# Patient Record
Sex: Female | Born: 1975 | Hispanic: No | Marital: Married | Smoking: Never smoker
Health system: Southern US, Community
[De-identification: ages and names within clinical notes are randomized; demographics above are authoritative.]

## PROBLEM LIST (undated history)

## (undated) DIAGNOSIS — L509 Urticaria, unspecified: Secondary | ICD-10-CM

## (undated) DIAGNOSIS — Z923 Personal history of irradiation: Secondary | ICD-10-CM

## (undated) DIAGNOSIS — K759 Inflammatory liver disease, unspecified: Secondary | ICD-10-CM

## (undated) DIAGNOSIS — B181 Chronic viral hepatitis B without delta-agent: Secondary | ICD-10-CM

## (undated) DIAGNOSIS — Z9221 Personal history of antineoplastic chemotherapy: Secondary | ICD-10-CM

## (undated) DIAGNOSIS — J189 Pneumonia, unspecified organism: Secondary | ICD-10-CM

## (undated) HISTORY — DX: Urticaria, unspecified: L50.9

## (undated) HISTORY — DX: Chronic viral hepatitis B without delta-agent: B18.1

---

## 2000-11-14 ENCOUNTER — Inpatient Hospital Stay (HOSPITAL_COMMUNITY): Admission: AD | Admit: 2000-11-14 | Discharge: 2000-11-14 | Payer: Self-pay | Admitting: Obstetrics & Gynecology

## 2000-11-14 ENCOUNTER — Encounter: Payer: Self-pay | Admitting: *Deleted

## 2000-11-20 ENCOUNTER — Other Ambulatory Visit: Admission: RE | Admit: 2000-11-20 | Discharge: 2000-11-20 | Payer: Self-pay | Admitting: Obstetrics and Gynecology

## 2001-04-23 ENCOUNTER — Inpatient Hospital Stay (HOSPITAL_COMMUNITY): Admission: AD | Admit: 2001-04-23 | Discharge: 2001-04-23 | Payer: Self-pay | Admitting: Obstetrics and Gynecology

## 2001-04-27 ENCOUNTER — Encounter (INDEPENDENT_AMBULATORY_CARE_PROVIDER_SITE_OTHER): Payer: Self-pay

## 2001-04-27 ENCOUNTER — Inpatient Hospital Stay (HOSPITAL_COMMUNITY): Admission: AD | Admit: 2001-04-27 | Discharge: 2001-04-30 | Payer: Self-pay | Admitting: Obstetrics and Gynecology

## 2004-10-10 ENCOUNTER — Emergency Department (HOSPITAL_COMMUNITY): Admission: EM | Admit: 2004-10-10 | Discharge: 2004-10-10 | Payer: Self-pay | Admitting: Emergency Medicine

## 2006-01-10 ENCOUNTER — Other Ambulatory Visit: Admission: RE | Admit: 2006-01-10 | Discharge: 2006-01-10 | Payer: Self-pay | Admitting: *Deleted

## 2009-03-14 ENCOUNTER — Other Ambulatory Visit: Admission: RE | Admit: 2009-03-14 | Discharge: 2009-03-14 | Payer: Self-pay | Admitting: Family Medicine

## 2010-06-22 ENCOUNTER — Other Ambulatory Visit (HOSPITAL_COMMUNITY)
Admission: RE | Admit: 2010-06-22 | Discharge: 2010-06-22 | Disposition: A | Discharge status: Home / Self Care | Payer: 59 | Source: Ambulatory Visit | Attending: Internal Medicine | Admitting: Internal Medicine

## 2010-06-22 ENCOUNTER — Other Ambulatory Visit: Payer: Self-pay

## 2010-06-22 DIAGNOSIS — Z01419 Encounter for gynecological examination (general) (routine) without abnormal findings: Secondary | ICD-10-CM | POA: Insufficient documentation

## 2010-09-07 NOTE — Discharge Summary (Signed)
[facility]  Patient:    Sara Browning, Sara Browning Visit Number: [id number] MRN: [id number]          Service Type: OBS Location: 910A 9147 01 Attending Physician:  Leonard Schwartz Dictated by:   Nigel Bridgeman, C.N.M. Admit Date:  04/27/2001 Discharge Date: 04/30/2001                             Discharge Summary  ADMISSION DIAGNOSES: 1. Intrauterine pregnancy at 38-5/7 weeks. 2. Positive group B strep. 3. Spontaneous rupture of membranes with early labor. 4. Hepatitis B carrier. 5. History of proteinuria.  DISCHARGE DIAGNOSES: 1. Term pregnancy. 2. Nonreassuring fetal heart rate tracing. 3. Short cord. 4. Occiput posterior position.  PROCEDURE: 1. Primary low transverse cesarean section. 2. Epidural anesthesia.  HISTORY OF PRESENT ILLNESS:  Sara Browning is a 35 year old gravida 4, para 0-0-3-0, at 38-5/7 weeks who presented with spontaneous rupture of membranes early in the morning of 04/27/01.  She was having mild uterine contractions. She had a 24 hour urine completed on 04/25/01, secondary to pronteinuria, but no other signs of preeclampsia.  She had 698 mg of protein in a 24 hour specimen, normal creatinine clearance.  PREGNANCY REMARKABLE FOR: 1. Hepatitis B carrier. 2. Positive group B strep. 3. Proteinuria without signs or symptoms of preeclampsia. 4. One TAB, two SAB. 5. Equivocal rubella. 6. Slightly late care. 7. Sister with congenital heart disease.  HOSPITAL COURSE:  On admission, the patient was 2 cm, 80%, vertex, -2, with a reactive fetal heart rate tracing, and a negative spontaneous CST.  The patient progressed along in her labor.  She received an epidural for pain management.  She began to be augmented with Pitocin when she moved to approximately 5 cm.  At that point, there was a plateau of her labor status noted, and her [place] units were noted to be approximately 150.  Pitocin augmentation per low dose was begun.   She had progressed to 6 cm, and then began to have a vomiting episode with a subsequent hyperstimulation and bradycardia response.  There was resolution of this with scalp stimulation and position change, but then began to have moderate to severe variables with each uterine contraction.  Usual measures did not cause an elimination of these findings, including ammio-infusion, no cord prolapse was noted.  Dr. Stefano Gaul was consulted, and a plan was made for cesarean section secondary to nonreassuring fetal heart rate tracing.  The fetal heart rate was noted to have good variability throughout these findings.  The patient was taken to the operating room where a primary low transverse cesarean section was performed by Dr. Stefano Gaul under epidural anesthesia.  Findings were a viable female by the name of [name], weight 7 pounds 7 ounces, Apgars were 8 and 9.  The fetus was in an OP presentation.  There was a significantly short cord noted. Placenta was sent to pathology.  Normal uterus, tubes, and ovaries were noted. The patient was taken to the recovery room in good condition.  Estimated blood loss was 700 cc.  Infant was taken to the full-term nursery in good condition. By postoperative day #1, the patient was doing well, she was bottle feeding, her hemoglobin was 10.0, white blood cell count was 14.5.  She was electing the Ortho Ever patch for contraception.  Her incision was clean, dry, and intact.  The rest of her hospital stay was uncomplicated.  By day #3, she  was deemed to receive full benefit of her hospital stay.  She had a normal physical examination, and she was discharged home.  DISCHARGE INSTRUCTIONS:  Per [facility] handout.  DISCHARGE MEDICATIONS: 1. Motrin 600 mg p.o. q.6h. p.r.n. pain. 2. Tylox one or two p.o. q.3-4h. p.r.n. pain.  FOLLOWUP:  In six weeks at [facility] Dictated by:   Nigel Bridgeman, C.N.M. Attending Physician:  Leonard Schwartz DD:  04/30/01 TD:  04/30/01 Job: [id number]

## 2010-09-07 NOTE — H&P (Signed)
[facility]  Patient:    Sara Browning, Sara Browning Visit Number: [id number] MRN: [id number]          Service Type: OBS Location: 910B 9155 01 Attending Physician:  Leonard Schwartz Dictated by:   Nigel Bridgeman, C.N.M. Admit Date:  04/27/2001                           History and Physical  DATE OF BIRTH:                21-Jun-1975  HISTORY:                      Sara Browning is a 35 year old gravida 4, para 0-0-3-0 at 53 5/7 weeks who presents today with spontaneous rupture of membranes at 6:45 this morning.  Clear fluid noted.  She is reporting contractions approximately every four minutes of mild to moderate quality. Pregnancy has been remarkable for one TAB, one questionable ectopic versus SAB, and one verified SAB, proteinuria in the third trimester with normal blood pressures and normal chemistries, hepatitis B carrier, equivocal rubella, slightly late to care, sister with congenital heart disease, positive beta Strep.  PRENATAL LABORATORIES:        Blood type B+.  Rh antibody negative.  VDRL nonreactive.  Rubella titer equivocal.  Hepatitis B surface antigen positive. Liver function tests were normal in August.  GC and chlamydia cultures were negative.  Pap was normal.  Glucose challenge was normal.  AFP was normal. Hemoglobin upon entry into practice was 10.9, 10.8 at 30 weeks.  Sickle cell test is negative.  EDC of May 06, 2001 was established by ultrasound in July at approximately 14 weeks secondary to questionable LMP dating.  Group B Strep culture was positive at 36 weeks.  HISTORY OF PRESENT PREGNANCY: Patient entered care at approximately 16 weeks. She had been seen in maternity admissions just prior to that and had an ultrasound.  She is a hepatitis B carrier.  Her LFTs have been monitored q.6 months while she was in the [employer].  She had some spotting in the first trimester.  The rest of her pregnancy was essentially  uncomplicated until she showed 2+ protein on a voided specimen in the first trimester.  LFTs and chemistries were normal.  A 24 hour urine protein was done with 24 hour urine protein approximately 637.  PAST OBSTETRICAL HISTORY:     In 1998 she had a therapeutic termination of pregnancy at 12 weeks.  In 2000 she had a questionable ectopic versus SAB at 4-5 weeks.  In 2001 she had a spontaneous miscarriage in the early stages. She did have some anemia.  PAST MEDICAL HISTORY:         She had OCP use three months of the year prior to pregnancy.  She thinks she may have had a yeast infection in the past.  She reports usual childhood illnesses.  She was diagnosed as a hepatitis B carrier while she was in the [employer] and has had approximately every six to nine month LFTs since then.  She had one history of UTI.  She had her wisdom teeth removed in the past.  She was separated from the miliary in October 2001.  Has had no LFTs since then.  FAMILY HISTORY:               Patients sister has some kind of heart disease since birth and  sees a cardiologist regularly.  Paternal grandmother has hypertension, is on medications.  Patients mother and sister are both hepatitis B carriers.  Paternal grandfather has diet controlled diabetes. Maternal grandmother has insulin-dependent diabetes.  Her uncle is a smoker.  GENETIC HISTORY:              Remarkable for the father of the babys mother having web toes, patients half-sister having a congenital heart disease, father of the babys cousin married to another cousin, father of the babys first cousin having sickle cell disease.  Father of the baby was tested but it has been negative.  ALLERGIES:                    No known drug allergies.  SOCIAL HISTORY:               Patient is single.  The father of the baby is involved and support.  His name is Arnetha Courser.  She is a Education officer, environmental and is nondenominational.  She has one year of college.  She  is currently a Consulting civil engineer.  She was separated from the [employer] in October 2001.  Father of the baby has a high school education.  He is in the [employer].  She denies any alcohol, drug, or tobacco use during this pregnancy.  PHYSICAL EXAMINATION  VITAL SIGNS:                  Stable.  Patient is afebrile.  HEENT:                        Within normal limits.  LUNGS:                        Bilateral breath sounds are clear.  HEART:                        Regular rate and rhythm without murmur.  BREASTS:                      Soft and nontender.  ABDOMEN:                      Fundal height is approximately 38 cm.  Estimated fetal weight 7-7.5 pounds.  Uterine contractions are every two to four minutes, mild to moderate quality.  PELVIC:                       Cervical examination:  2 cm, 80%, vertex at -2 station with leaking clear fluid noted.  Fetal heart rate is reactive with negative spontaneous CSP.  EXTREMITIES:                  Deep tendon reflexes are 2+ without clonus. There is a trace edema noted.  IMPRESSION:                   1. Intrauterine pregnancy at 38 5/7 weeks.                               2. Spontaneous rupture of membranes with early                                  labor.  3. Positive group B Strep.  PLAN:                         1. Admit to birthing suite for consult with Dr.                                  Marline Backbone as attending physician.                               2. Routine certified nurse midwife orders.                               3. Plan group B Strep prophylaxis, penicillin-G                                  per standard dosing.                               4. Plan epidural as labor advances. Dictated by:   Nigel Bridgeman, C.N.M. Attending Physician:  Leonard Schwartz DD:  04/27/01 TD:  04/27/01 Job: [id number]

## 2010-09-07 NOTE — Op Note (Signed)
[facility]  Patient:    Sara Browning, Sara Browning Visit Number: [id number] MRN: [id number]          Service Type: OBS Location: 910B 9155 01 Attending Physician:  Leonard Schwartz Dictated by:   Janine Limbo, M.D. Proc. Date: 04/27/01 Admit Date:  04/27/2001                             Operative Report  PREOPERATIVE DIAGNOSES:       1. Term intrauterine pregnancy.                               2. Nonreassuring fetal heart                                  rate tracing.  POSTOPERATIVE DIAGNOSES:      1. Term intrauterine pregnancy.                               2. Nonreassuring fetal heart rate                                  tracing.                               3. Short umbilical cord.  OPERATION:                    Primary low transverse cesarean section.  SURGEON:                      Janine Limbo, M.D.  ASSISTANT:                    Nigel Bridgeman, C.N.M.  ANESTHESIA:                   Epidural.  DISPOSITION:                  The patient is a 35 year old female, gravida 4, para 0-0-3-0, who presents at [redacted] weeks gestation with rupture of membranes. She dilated her cervix to 6 cm but progressed no further.  The patient coughed on one occasion and then she began having deep variable and late decelerations that did not resolve with positioning.  The patient was advised of our recommendation that we proceed with cesarean delivery and the risks of the procedure were reviewed.  FINDINGS:  A 7 pound 7 ounce female infant [name]) was delivered.  The infant was in occiput posterior presentation.  The Apgars were 8 at one minute and 9 at five minutes.  The uterus, fallopian tubes and ovaries were normal. The placenta was normal except that the umbilical cord was very short.  The placenta was sent to pathology for evaluation.  DESCRIPTION OF PROCEDURE:  The patient was taken to the operating room where it was determined that the  epidural the patient had received for labor would b adequate for cesarean section.  A Foley catheter had previously been placed. The patients abdomen was prepped with multiple layers of Betadine and then sterilely draped.  A low transverse incision was  made in the abdomen and carried sharply through the subcutaneous tissue, the fascia and the anterior peritoneum.  An incision was made in the lower uterine segment and extended transversely.  The fetal head was delivered from the occiput posterior presentation.  The mouth and nose were suctioned.  The remainder of the infant was delivered and the infant was handed to the awaiting pediatrician.  The umbilical cord was noted to be very short.  Routine cord blood studies were obtained.  The placenta was removed.  The uterine cavity was cleaned of amniotic fluid, clotted blood and membranes.  The uterine incision was closed using a running locking suture of 2-0 Vicryl.  Hemostasis was noted to be adequate.  The pericolonic gutters were cleaned of amniotic fluid and clotted blood.  Again hemostasis was adequate.  The pelvis was vigorously irrigated. The anterior peritoneum and abdominal musculature were reapproximated in the midline using interrupted sutures of 2-0 Vicryl.  The fascia was closed using a running suture of 0 Vicryl followed by three interrupted sutures of 0 Vicryl.  The subcutaneous layer was irrigated.  The subcutaneous layer was closed using a running suture of 2-0 Vicryl and 3-0 Vicryl.  The skin was reapproximated using skin staples.  Sponge, needle and instrument counts were correct on two occasions.  The estimated blood loss was 700 cc.  The patient tolerated the procedure well.  The patient was taken to the recovery room in stable condition.  The infant was taken to the full-term nursery in stable condition.  Placenta was sent to pathology for evaluation. INDICATIONS:  DESCRIPTION OF PROCEDURE: Dictated by:   Janine Limbo, M.D. Attending Physician:  Leonard Schwartz DD:  04/27/01 TD:  04/27/01 Job: [id number]

## 2011-01-07 ENCOUNTER — Other Ambulatory Visit: Payer: Self-pay

## 2011-01-07 ENCOUNTER — Other Ambulatory Visit (HOSPITAL_COMMUNITY)
Admission: RE | Admit: 2011-01-07 | Discharge: 2011-01-07 | Disposition: A | Discharge status: Home / Self Care | Payer: 59 | Source: Ambulatory Visit | Attending: Family Medicine | Admitting: Family Medicine

## 2011-01-07 DIAGNOSIS — Z01419 Encounter for gynecological examination (general) (routine) without abnormal findings: Secondary | ICD-10-CM | POA: Insufficient documentation

## 2012-01-08 ENCOUNTER — Other Ambulatory Visit: Payer: Self-pay | Admitting: Family Medicine

## 2012-01-08 DIAGNOSIS — R430 Anosmia: Secondary | ICD-10-CM

## 2012-01-13 ENCOUNTER — Ambulatory Visit
Admission: RE | Admit: 2012-01-13 | Discharge: 2012-01-13 | Disposition: A | Discharge status: Home / Self Care | Payer: 59 | Source: Ambulatory Visit | Attending: Family Medicine | Admitting: Family Medicine

## 2012-01-13 ENCOUNTER — Other Ambulatory Visit (HOSPITAL_COMMUNITY)
Admission: RE | Admit: 2012-01-13 | Discharge: 2012-01-13 | Disposition: A | Discharge status: Home / Self Care | Payer: 59 | Source: Ambulatory Visit | Attending: Family Medicine | Admitting: Family Medicine

## 2012-01-13 ENCOUNTER — Other Ambulatory Visit: Payer: Self-pay | Admitting: Family Medicine

## 2012-01-13 DIAGNOSIS — R430 Anosmia: Secondary | ICD-10-CM

## 2012-01-13 DIAGNOSIS — Z01419 Encounter for gynecological examination (general) (routine) without abnormal findings: Secondary | ICD-10-CM | POA: Insufficient documentation

## 2013-10-05 IMAGING — CT CT MAXILLOFACIAL W/O CM
2 of 3 series · 16 of 37 positions shown, 20 images · non-contrast
Comparison: None.

CLINICAL DATA: Anosmia, postnasal drainage.

CT MAXILLOFACIAL WITHOUT CONTRAST
TECHNIQUE: Multidetector CT imaging of the maxillofacial
structures was performed. Multiplanar CT image reconstructions were
also generated.

[Series 601: coronal facial · coronal · 0.33mm/px · 3 of 74 slices shown]
[im 25/74  bone]
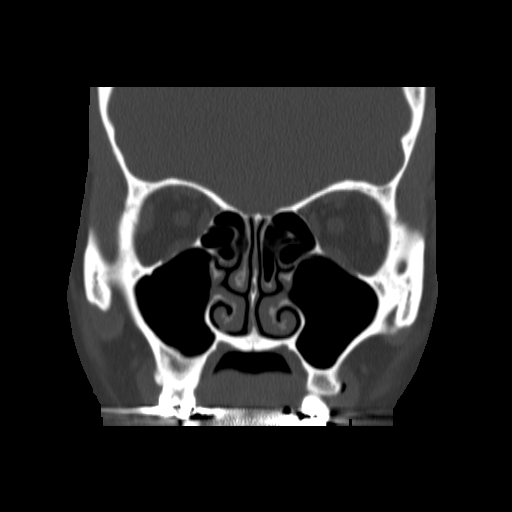
[im 37/74  bone]
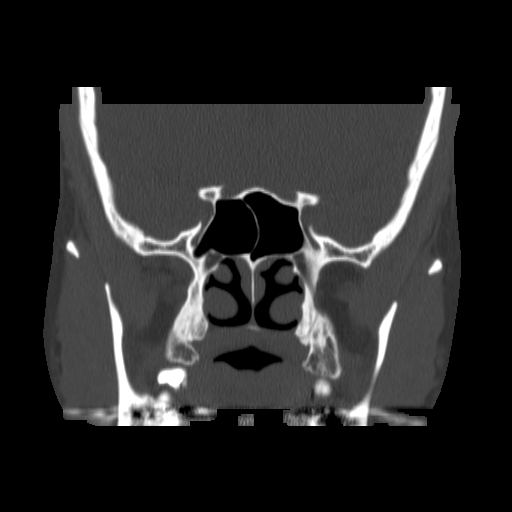
[im 49/74  bone]
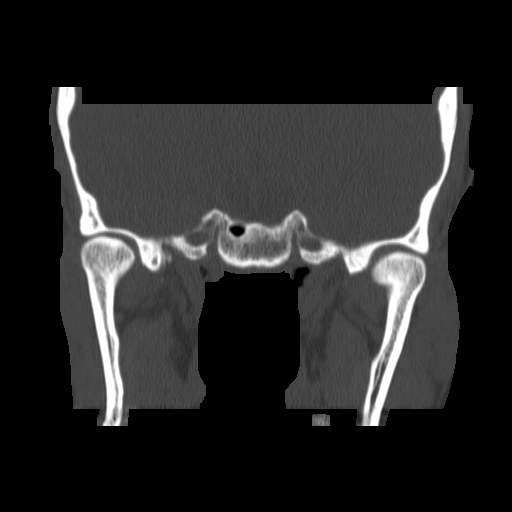

[Series 602: sagittal facial · sagittal · 0.33mm/px · 13 of 83 slices shown, 17 images]
[im 5/83  brain]
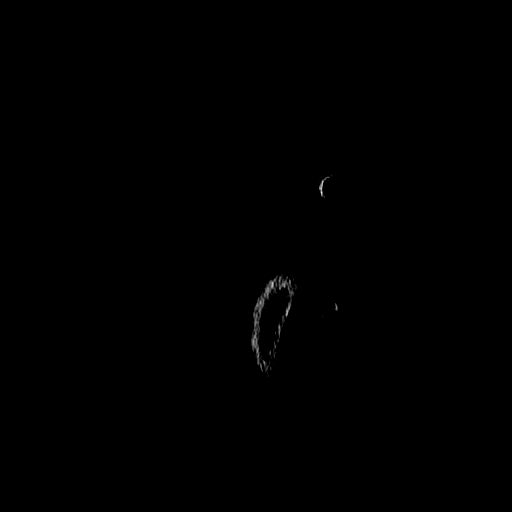
[im 5/83  bone]
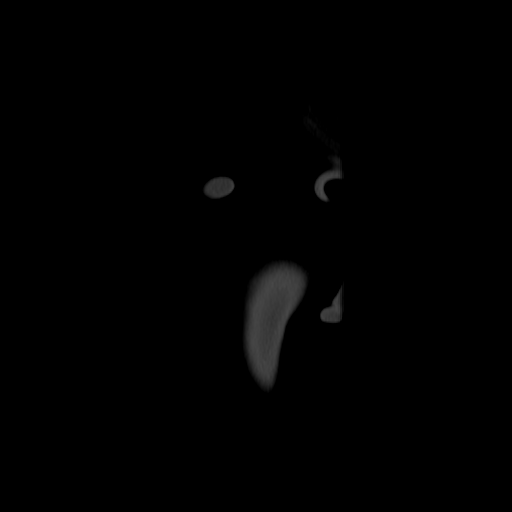
[im 10/83  bone]
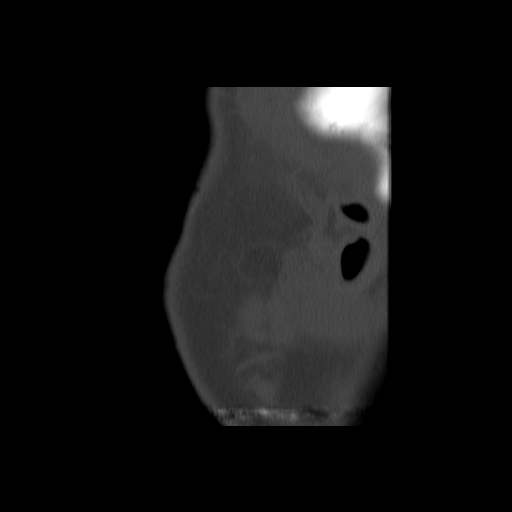
[im 19/83  bone]
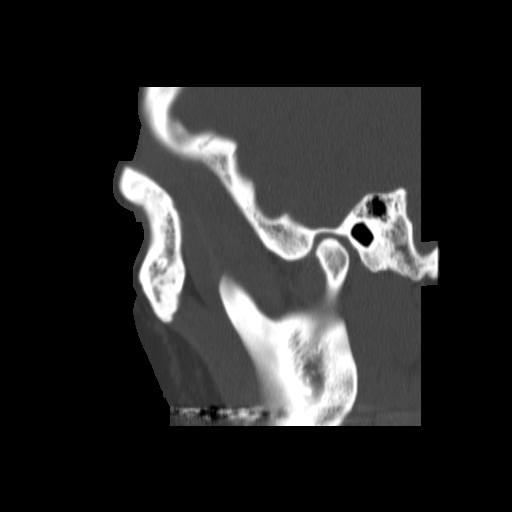
[im 23/83  bone]
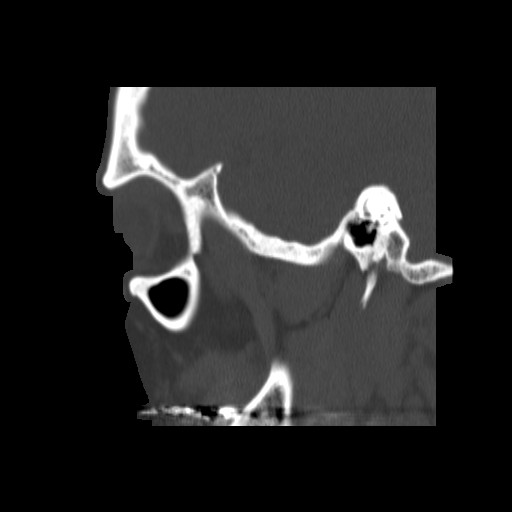
[im 28/83  brain]
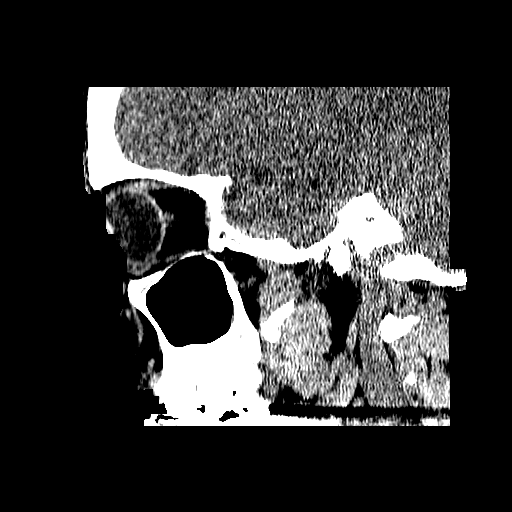
[im 28/83  bone]
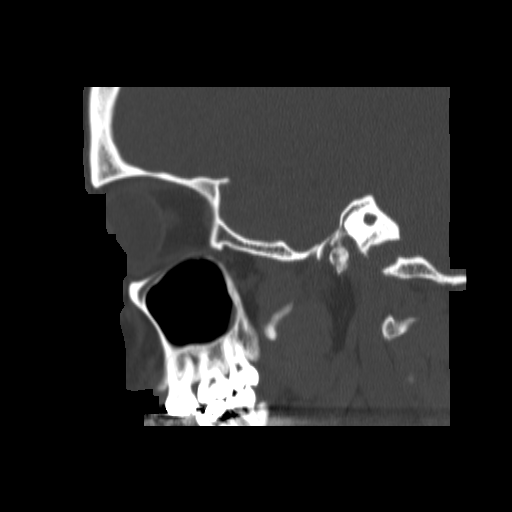
[im 37/83  bone]
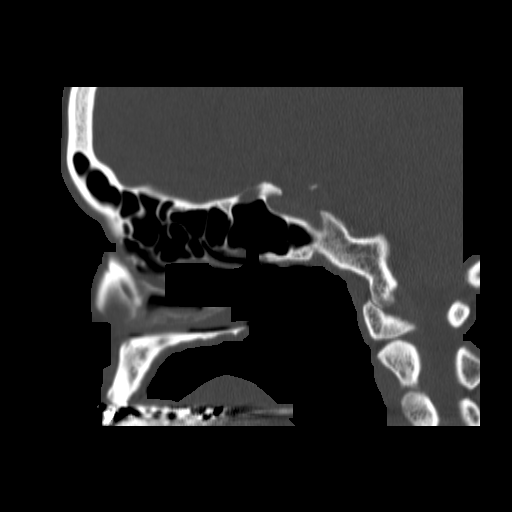
[im 42/83  bone]
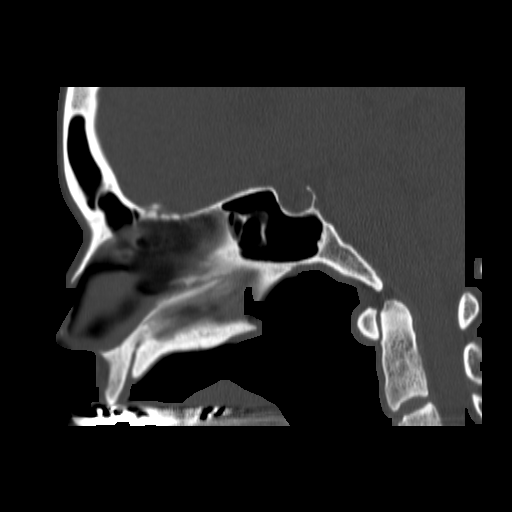
[im 46/83  bone]
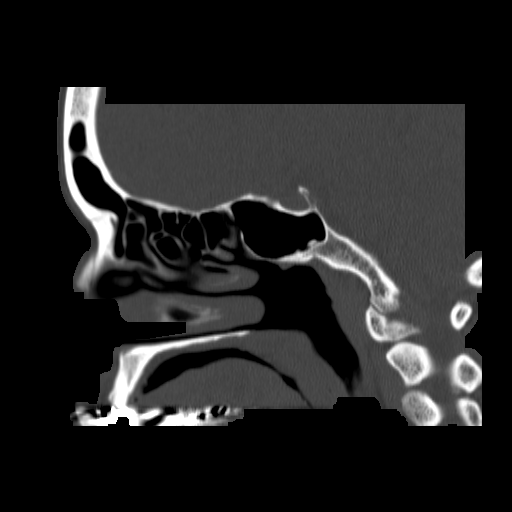
[im 55/83  brain]
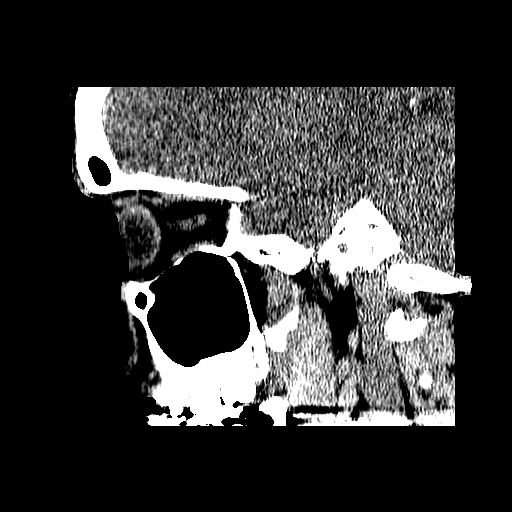
[im 55/83  bone]
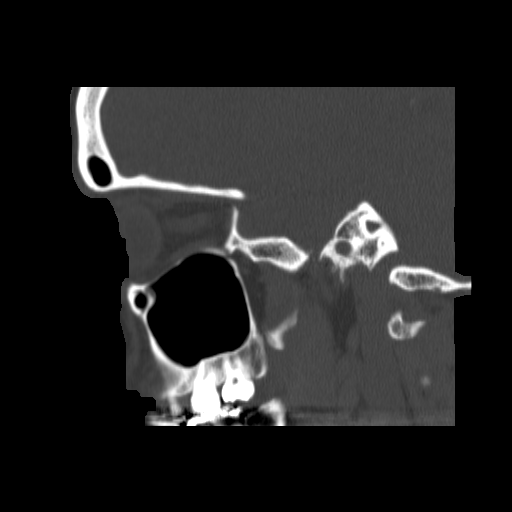
[im 60/83  bone]
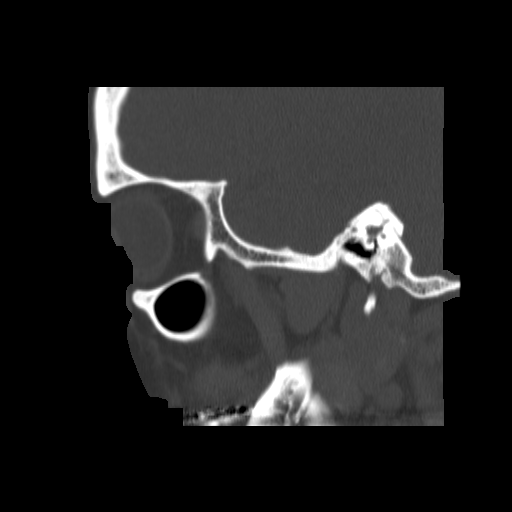
[im 64/83  bone]
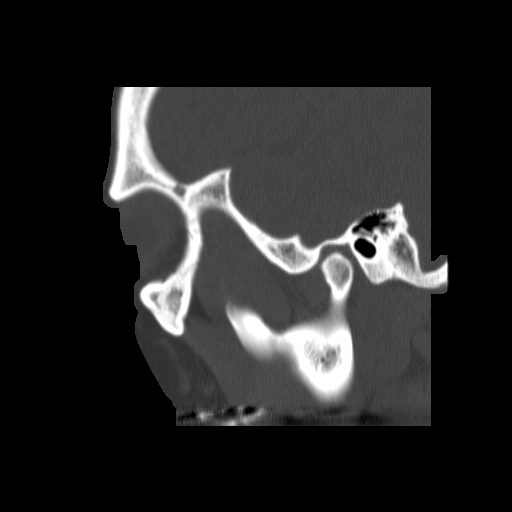
[im 73/83  bone]
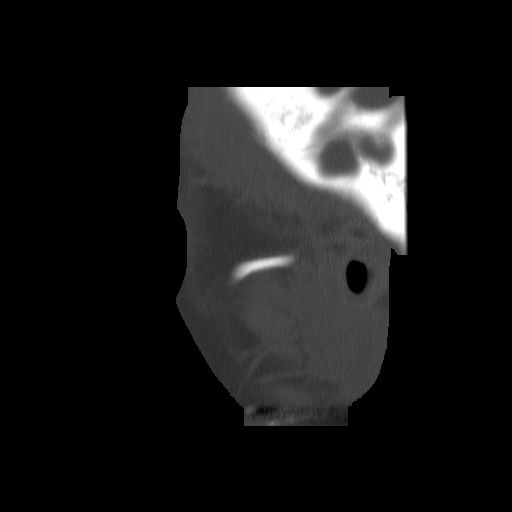
[im 78/83  brain]
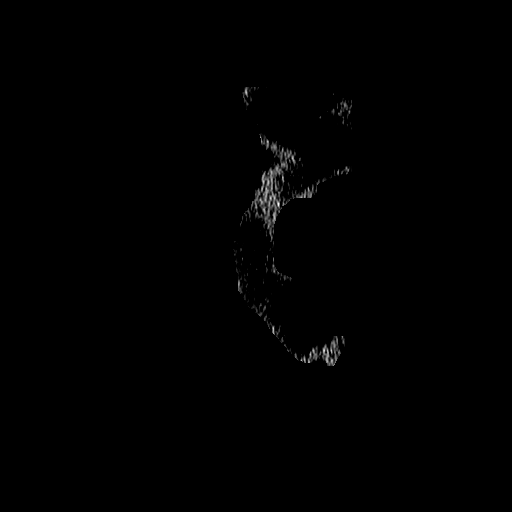
[im 78/83  bone]
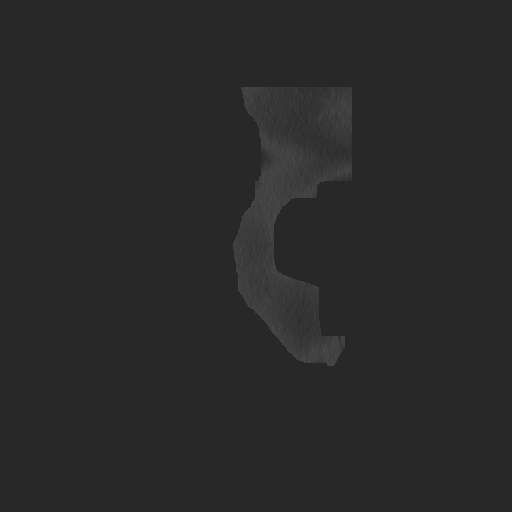

[16 of 37 positions shown; findings below may reference images not displayed]

FINDINGS: Trace mucosal thickening in the right maxillary sinus.
The paranasal sinuses are otherwise clear.  Visualized portions of
the intracranial contents show no acute findings.
IMPRESSION: Trace mucosal thickening in the right maxillary sinus.  Paranasal
sinuses are otherwise clear.

## 2014-02-28 ENCOUNTER — Other Ambulatory Visit (HOSPITAL_COMMUNITY)
Admission: RE | Admit: 2014-02-28 | Discharge: 2014-02-28 | Disposition: A | Discharge status: Home / Self Care | Payer: BC Managed Care – PPO | Source: Ambulatory Visit | Attending: Family Medicine | Admitting: Family Medicine

## 2014-02-28 ENCOUNTER — Other Ambulatory Visit: Payer: Self-pay

## 2014-02-28 DIAGNOSIS — Z0000 Encounter for general adult medical examination without abnormal findings: Secondary | ICD-10-CM | POA: Insufficient documentation

## 2014-03-02 LAB — CYTOLOGY - PAP

## 2020-03-28 ENCOUNTER — Encounter: Payer: Self-pay | Admitting: Allergy

## 2020-03-28 ENCOUNTER — Ambulatory Visit (INDEPENDENT_AMBULATORY_CARE_PROVIDER_SITE_OTHER): Payer: BC Managed Care – PPO | Admitting: Allergy

## 2020-03-28 ENCOUNTER — Other Ambulatory Visit: Payer: Self-pay

## 2020-03-28 VITALS — BP 102/68 | HR 70 | Resp 16 | Ht 63.5 in | Wt 160.4 lb

## 2020-03-28 DIAGNOSIS — R059 Cough, unspecified: Secondary | ICD-10-CM

## 2020-03-28 DIAGNOSIS — L508 Other urticaria: Secondary | ICD-10-CM

## 2020-03-28 DIAGNOSIS — T783XXD Angioneurotic edema, subsequent encounter: Secondary | ICD-10-CM

## 2020-03-28 MED ORDER — ALBUTEROL SULFATE HFA 108 (90 BASE) MCG/ACT IN AERS: INHALATION_SPRAY | RESPIRATORY_TRACT | 1 refills | Status: DC

## 2020-03-28 NOTE — Patient Instructions (Addendum)
Hives and swelling, chronic  - at this time etiology of hives and swelling is unknown.  Hives can be caused by a variety of different triggers including illness/infection, foods, medications, stings, exercise, pressure, vibrations, extremes of temperature to name a few however majority of the time there is no identifiable trigger.  Your symptoms have been ongoing for >6 weeks making this chronic thus will obtain labwork to evaluate: CBC w diff, CMP, tryptase, hive panel, environmental panel, alpha-gal panel, milk IgE  - for hive/swelling management recommend the following regimen: Cetirizine 10 mg 1 tablet twice a day with Pepcid 20 mg 1 tablet twice a day.  These both have antihistamine properties.  -If high-dose antihistamine regimen is not effective enough then will recommend adding Singulair.  If this is also not enough in the next step would be to initiate Xolair monthly injections.  We will discuss this in further detail if needing to get to the step.  Cough  - Most common reasons for cough include allergies with sinus drainage, asthma/lung issues and reflux  - You know a component of heartburn this reflux could be a big trigger of cough.  As above we will have you start Pepcid twice a day.  Please note if this improves with heartburn symptoms and cough  -You also notes wheeze which typically is a primary lung manifestation.  Try albuterol inhaler 2 puffs every 4 hours as needed for cough, wheeze, shortness of breath or chest tightness.  Note if you have any improvement with use of albuterol and let us know  Follow-up in 2-3 months or sooner if needed

## 2020-03-28 NOTE — Progress Notes (Signed)
New Patient Note  RE: Sara Browning MRN: [id number] DOB: 1975-09-19 Date of Office Visit: 03/28/2020  Referring provider: Jerrye Bushy, FNP Primary care provider: [name], NP    Chief Complaint: hives  History of present illness: Sara Browning is a 44 y.o. female presenting today for consultation for hives.  She reports having hives that started when the weather start getting cooler in the evening (around the end of September).  She states she was noticing hives about 3-4 times a week and as weather got colder became more frequent.  Currently the hives occur about every 2-3 days.   She has noted lip swelling with hives.  She states she can have hives on exposure skin areas like legs and arms.  Also noticed has occurred on scalp and on bra line.  The hives are itchy.  Hives are typically gone by the next day after starting at night.  She states a hot shower does help.  She also has been using coconut oil and feels like it helps as well.  She has also been taking cetirizine 1 tab a day as needed when she states she can't take the itch.  It does help with the itch.  She did try benadryl as it made her groggy the next day thus didn't like it.  No fevers, no joint aches/pains and no marks/bruising.  Denies any preceding illness, sting's, new medications, new foods, change in soaps/detergents/lotions.  Has not identified any other exacerbating factors.  She also states she has an ongoing cough that seems to start around the same time as the hives. States co-workers have noted she coughs at work.  She also reports coughs as home.  She mentioned noticing mucus in her throat as well.  She does not wheezing with the cough.  Denies shortness of breath or chest tightness.  Has been using cough medications.  Does not have an inhaler and no history asthma.  She has noted that when she drinks coffee with creamer she will note cough.  She state she does not drink a lot of milk but does eat a lot  of cheese.  She denies having symptoms of allergic rhinoconjunctivitis or eczema.   She states she is a carrier for HepB and states she had normal testing at her recent physical.   Review of systems: Review of Systems  Constitutional: Negative.   HENT: Negative.   Eyes: Negative.   Respiratory: Positive for cough and wheezing. Negative for sputum production and shortness of breath.   Cardiovascular: Negative.   Gastrointestinal: Negative.   Musculoskeletal: Negative.   Skin: Positive for itching and rash.  Neurological: Negative.     All other systems negative unless noted above in HPI  Past medical history: Past Medical History:  Diagnosis Date  . Hepatitis 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)   . Urticaria     Past surgical history: Past Surgical History:  Procedure Laterality Date  . CESAREAN SECTION     twice    Family history:  Family History  Problem Relation Age of Onset  . Hypertension Paternal Grandmother   . Diabetes Paternal Grandfather     Social history: Lives in a home with carpeting in the bedroom with gas and electric heating and central cooling.  Dog conditioning.  There is no concern for water damage, mildew or roaches in the home.  She is a Chief Technology Officer.  She denies a smoking history.  Medication List: Current Outpatient Medications  Medication  Sig Dispense Refill  . Multiple Vitamin (MULTIVITAMIN) tablet Take 1 tablet by mouth daily.     No current facility-administered medications for this visit.    Known medication allergies: No Known Allergies   Physical examination: Blood pressure 102/68, pulse 70, resp. rate 16, height 5' 3.5" (1.613 m), weight 160 lb 6.4 oz (72.8 kg), SpO2 99 %.  General: Alert, interactive, in no acute distress. HEENT: PERRLA, TMs pearly gray, turbinates non-edematous without discharge, post-pharynx non erythematous. Neck: Supple without lymphadenopathy. Lungs: Clear to auscultation without wheezing, rhonchi or rales.  {no increased work of breathing. CV: Normal S1, S2 without murmurs. Abdomen: Nondistended, nontender. Skin: Warm and dry, without lesions or rashes. Extremities:  No clubbing, cyanosis or edema. Neuro:   Grossly intact.  Diagnositics/Labs: None today  Assessment and plan:   Urticaria with angioedema, chronic  - at this time etiology of hives and swelling is unknown.  Hives can be caused by a variety of different triggers including illness/infection, foods, medications, stings, exercise, pressure, vibrations, extremes of temperature to name a few however majority of the time there is no identifiable trigger.  Your symptoms have been ongoing for >6 weeks making this chronic thus will obtain labwork to evaluate: CBC w diff, CMP, tryptase, hive panel, environmental panel, alpha-gal panel, milk IgE  - for hive/swelling management recommend the following regimen: Cetirizine 10 mg 1 tablet twice a day with Pepcid 20 mg 1 tablet twice a day.  These both have antihistamine properties.  -If high-dose antihistamine regimen is not effective enough then will recommend adding Singulair.  If this is also not enough in the next step would be to initiate Xolair monthly injections.  We will discuss this in further detail if needing to get to the step.  Cough  - Most common reasons for cough include allergies with sinus drainage, asthma/lung issues and reflux  - You know a component of heartburn this reflux could be a big trigger of cough.  As above we will have you start Pepcid twice a day.  Please note if this improves with heartburn symptoms and cough  -You also notes wheeze which typically is a primary lung manifestation.  Try albuterol inhaler 2 puffs every 4 hours as needed for cough, wheeze, shortness of breath or chest tightness.  Note if you have any improvement with use of albuterol and let us know  Follow-up in 2-3 months or sooner if needed  I appreciate the opportunity to take part in Brandin's  care. Please do not hesitate to contact me with questions.  Sincerely,   Margo Aye, MD Allergy/Immunology [facility]

## 2020-03-29 LAB — ALPHA-GAL PANEL

## 2020-04-04 LAB — ALPHA-GAL PANEL

## 2020-04-08 LAB — CBC WITH DIFFERENTIAL/PLATELET
Basophils Absolute: 0 10*3/uL (ref 0.0–0.2)
Basos: 0 %
EOS (ABSOLUTE): 0.1 10*3/uL (ref 0.0–0.4)
Eos: 2 %
Hematocrit: 43.6 % (ref 34.0–46.6)
Hemoglobin: 14.9 g/dL (ref 11.1–15.9)
Immature Grans (Abs): 0 10*3/uL (ref 0.0–0.1)
Immature Granulocytes: 0 %
Lymphocytes Absolute: 2.5 10*3/uL (ref 0.7–3.1)
Lymphs: 35 %
MCH: 28.7 pg (ref 26.6–33.0)
MCHC: 34.2 g/dL (ref 31.5–35.7)
MCV: 84 fL (ref 79–97)
Monocytes Absolute: 0.4 10*3/uL (ref 0.1–0.9)
Monocytes: 6 %
Neutrophils Absolute: 4.1 10*3/uL (ref 1.4–7.0)
Neutrophils: 57 %
Platelets: 257 10*3/uL (ref 150–450)
RBC: 5.19 x10E6/uL (ref 3.77–5.28)
RDW: 11.9 % (ref 11.7–15.4)
WBC: 7.1 10*3/uL (ref 3.4–10.8)

## 2020-04-08 LAB — ALLERGENS W/TOTAL IGE AREA 2
Alternaria Alternata IgE: <0.1 kU/L
Aspergillus Fumigatus IgE: <0.1 kU/L
Bermuda Grass IgE: <0.1 kU/L
Cat Dander IgE: <0.1 kU/L
Cedar, Mountain IgE: <0.1 kU/L
Cladosporium Herbarum IgE: <0.1 kU/L
Cockroach, German IgE: 0.2 kU/L — AB
Common Silver Birch IgE: <0.1 kU/L
Cottonwood IgE: <0.1 kU/L
D Farinae IgE: 0.74 kU/L — AB
D Pteronyssinus IgE: 0.82 kU/L — AB
Dog Dander IgE: <0.1 kU/L
Elm, American IgE: <0.1 kU/L
IgE (Immunoglobulin E), Serum: 141 IU/mL (ref 6–495)
Johnson Grass IgE: <0.1 kU/L
Maple/Box Elder IgE: <0.1 kU/L
Mouse Urine IgE: <0.1 kU/L
Oak, White IgE: <0.1 kU/L
Pecan, Hickory IgE: <0.1 kU/L
Penicillium Chrysogen IgE: <0.1 kU/L
Pigweed, Rough IgE: <0.1 kU/L
Ragweed, Short IgE: <0.1 kU/L
Sheep Sorrel IgE Qn: <0.1 kU/L
Timothy Grass IgE: <0.1 kU/L
White Mulberry IgE: <0.1 kU/L

## 2020-04-08 LAB — TRYPTASE: Tryptase: 7.2 ug/L (ref 2.2–13.2)

## 2020-04-08 LAB — THYROID ANTIBODIES
Thyroglobulin Antibody: <1 IU/mL (ref 0.0–0.9)
Thyroperoxidase Ab SerPl-aCnc: 20 IU/mL (ref 0–34)

## 2020-04-08 LAB — TSH+FREE T4
Free T4: 1.26 ng/dL (ref 0.82–1.77)
TSH: 2.21 u[IU]/mL (ref 0.450–4.500)

## 2020-04-08 LAB — COMPREHENSIVE METABOLIC PANEL
ALT: 13 IU/L (ref 0–32)
AST: 20 IU/L (ref 0–40)
Albumin/Globulin Ratio: 1.3 (ref 1.2–2.2)
Albumin: 4.3 g/dL (ref 3.8–4.8)
Alkaline Phosphatase: 59 IU/L (ref 44–121)
BUN/Creatinine Ratio: 13 (ref 9–23)
BUN: 11 mg/dL (ref 6–24)
Bilirubin Total: 0.5 mg/dL (ref 0.0–1.2)
CO2: 23 mmol/L (ref 20–29)
Calcium: 9.2 mg/dL (ref 8.7–10.2)
Chloride: 101 mmol/L (ref 96–106)
Creatinine, Ser: 0.82 mg/dL (ref 0.57–1.00)
GFR calc Af Amer: 102 mL/min/{1.73_m2} (ref >59)
GFR calc non Af Amer: 89 mL/min/{1.73_m2} (ref >59)
Globulin, Total: 3.2 g/dL (ref 1.5–4.5)
Glucose: 100 mg/dL — ABNORMAL HIGH (ref 65–99)
Potassium: 4.5 mmol/L (ref 3.5–5.2)
Sodium: 136 mmol/L (ref 134–144)
Total Protein: 7.5 g/dL (ref 6.0–8.5)

## 2020-04-08 LAB — ALLERGEN MILK: Milk IgE: 0.31 kU/L — AB

## 2020-04-08 LAB — CHRONIC URTICARIA: cu index: >50 — ABNORMAL HIGH (ref <10)

## 2020-04-08 LAB — ALPHA-GAL PANEL
Alpha Gal IgE*: <0.1 kU/L (ref <0.10)
Beef (Bos spp) IgE: <0.1 kU/L (ref <0.35)
Class Interpretation: 0
Class Interpretation: 0
Class Interpretation: 0
Lamb/Mutton (Ovis spp) IgE: <0.1 kU/L (ref <0.35)
Pork (Sus spp) IgE: <0.1 kU/L (ref <0.35)

## 2020-04-13 ENCOUNTER — Ambulatory Visit: Payer: Self-pay | Admitting: Allergy & Immunology

## 2021-05-25 DIAGNOSIS — Z20822 Contact with and (suspected) exposure to covid-19: Secondary | ICD-10-CM | POA: Diagnosis not present

## 2021-09-10 DIAGNOSIS — R059 Cough, unspecified: Secondary | ICD-10-CM | POA: Diagnosis not present

## 2021-09-10 DIAGNOSIS — J069 Acute upper respiratory infection, unspecified: Secondary | ICD-10-CM | POA: Diagnosis not present

## 2021-11-17 DIAGNOSIS — H9203 Otalgia, bilateral: Secondary | ICD-10-CM | POA: Diagnosis not present

## 2021-11-17 DIAGNOSIS — R350 Frequency of micturition: Secondary | ICD-10-CM | POA: Diagnosis not present

## 2022-03-25 DIAGNOSIS — J069 Acute upper respiratory infection, unspecified: Secondary | ICD-10-CM | POA: Diagnosis not present

## 2022-03-25 DIAGNOSIS — R059 Cough, unspecified: Secondary | ICD-10-CM | POA: Diagnosis not present

## 2022-04-08 DIAGNOSIS — R059 Cough, unspecified: Secondary | ICD-10-CM | POA: Diagnosis not present

## 2022-04-08 DIAGNOSIS — U071 COVID-19: Secondary | ICD-10-CM | POA: Diagnosis not present

## 2022-04-22 DIAGNOSIS — C801 Malignant (primary) neoplasm, unspecified: Secondary | ICD-10-CM

## 2022-04-22 HISTORY — DX: Malignant (primary) neoplasm, unspecified: C80.1

## 2022-06-06 DIAGNOSIS — N6321 Unspecified lump in the left breast, upper outer quadrant: Secondary | ICD-10-CM | POA: Diagnosis not present

## 2022-06-13 DIAGNOSIS — R922 Inconclusive mammogram: Secondary | ICD-10-CM | POA: Diagnosis not present

## 2022-06-13 DIAGNOSIS — N6321 Unspecified lump in the left breast, upper outer quadrant: Secondary | ICD-10-CM | POA: Diagnosis not present

## 2022-06-19 DIAGNOSIS — R928 Other abnormal and inconclusive findings on diagnostic imaging of breast: Secondary | ICD-10-CM | POA: Diagnosis not present

## 2022-06-19 DIAGNOSIS — R590 Localized enlarged lymph nodes: Secondary | ICD-10-CM | POA: Diagnosis not present

## 2022-06-19 DIAGNOSIS — Z170 Estrogen receptor positive status [ER+]: Secondary | ICD-10-CM | POA: Diagnosis not present

## 2022-06-19 DIAGNOSIS — N6321 Unspecified lump in the left breast, upper outer quadrant: Secondary | ICD-10-CM | POA: Diagnosis not present

## 2022-06-19 DIAGNOSIS — C50412 Malignant neoplasm of upper-outer quadrant of left female breast: Secondary | ICD-10-CM | POA: Diagnosis not present

## 2022-06-21 NOTE — Progress Notes (Signed)
Initial phone contact with newly diagnosed breast cancer pt. Names of local surgeons given. Pt to research and call navigator back with choice so that appt can be scheduled. Encouraged pt to stop by [facility] and pick up "The Breast Cancer Treatment Handbook". Navigation contact info given.

## 2022-06-24 ENCOUNTER — Telehealth: Payer: Self-pay | Admitting: Hematology and Oncology

## 2022-06-24 NOTE — Progress Notes (Signed)
Phone  contact with pt. Pt reports that sh wants to see Dr. Lindi Adie at [facility] first. Instructed pt to have her doctor send the referral to the [facility].

## 2022-06-24 NOTE — Telephone Encounter (Signed)
Scheduled appt per 3/4 referral. Pt is aware of appt date and time. Pt is aware to arrive 15 mins prior to appt time and to bring and updated insurance card. Pt is aware of appt location.

## 2022-07-05 ENCOUNTER — Telehealth: Payer: Self-pay | Admitting: Hematology and Oncology

## 2022-07-05 NOTE — Telephone Encounter (Signed)
Per 3/15 IB reached out to patient to answer questions about upcoming appointment, patient would like a call back from her nurse navigator Judeen Hammans, sent Judeen Hammans a message to call patient.

## 2022-07-05 NOTE — Telephone Encounter (Signed)
Per 3/15 IB reached out to patient regarding appointments.

## 2022-07-09 ENCOUNTER — Ambulatory Visit: Payer: Self-pay | Admitting: Surgery

## 2022-07-09 ENCOUNTER — Encounter: Payer: Self-pay | Admitting: Emergency Medicine

## 2022-07-09 ENCOUNTER — Inpatient Hospital Stay (HOSPITAL_BASED_OUTPATIENT_CLINIC_OR_DEPARTMENT_OTHER): Payer: BC Managed Care – PPO | Admitting: Hematology and Oncology

## 2022-07-09 ENCOUNTER — Other Ambulatory Visit: Payer: Self-pay

## 2022-07-09 ENCOUNTER — Inpatient Hospital Stay: Payer: BC Managed Care – PPO | Attending: Hematology and Oncology

## 2022-07-09 ENCOUNTER — Other Ambulatory Visit: Payer: Self-pay | Admitting: Emergency Medicine

## 2022-07-09 VITALS — BP 112/56 | HR 78 | Temp 97.9°F | Resp 18 | Ht 63.0 in | Wt 150.3 lb

## 2022-07-09 DIAGNOSIS — C50412 Malignant neoplasm of upper-outer quadrant of left female breast: Secondary | ICD-10-CM | POA: Insufficient documentation

## 2022-07-09 DIAGNOSIS — C50912 Malignant neoplasm of unspecified site of left female breast: Secondary | ICD-10-CM

## 2022-07-09 DIAGNOSIS — Z170 Estrogen receptor positive status [ER+]: Secondary | ICD-10-CM

## 2022-07-09 LAB — RESEARCH LABS

## 2022-07-09 NOTE — Assessment & Plan Note (Signed)
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  I agree with the considering breast MRI for accurate sizing.  Given strong ER/PR positivity despite high-grade and high proliferation index, if she is indeed node-negative is appropriate to continue or proceed with surgery upfront.  Following surgery, we can consider Oncotype testing and if she has Oncotype score greater than 26, we will proceed with chemotherapy.  If Oncotype score less than 25 or equal to 25, we can consider enrolling her in the offset trial which is currently aiming to study in addition of adjuvant chemo to ovarian function suppression with endocrine therapy in premenopausal patients with node-negative to node positive ER positive HER2 negative breast cancer and an Oncotype recurrence score less than or equal to 25.  We have discussed that she is likely going to need some adjuvant chemotherapy given several high risk features.  I have however not wild on the details of chemotherapy today.  She is agreeable to all the recommendations.  I recommended genetic testing given young age and she is agreeable.  We will place a referral to genetics.  She should return to clinic after surgery for further discussion.  She will receive adjuvant antiestrogen therapy after completing radiation likely for 5 to 10 years.

## 2022-07-09 NOTE — Research (Signed)
[employer] 2021-05 - Specimen Collection Study to Evaluate Biomarkers in Subjects with Cancer   INTRO TO STUDY/CONSENT FORMS AND CONSENT VISIT  Patient Sara Browning was identified by Dr. Chryl Heck as a potential candidate for the above listed study.  This Clinical Research Nurse met with Sara Browning, [id number], on 07/09/22 in a manner and location that ensures patient privacy to discuss participation in the above listed research study.  Patient is Accompanied by her spouse .  A copy of the informed consent document with embedded HIPAA language was provided to the patient.  Patient reads, speaks, and understands [place].    Patient was provided with the business card of this Nurse and encouraged to contact the research team with any questions.  Patient was provided the option of taking informed consent documents home to review and was encouraged to review at their convenience with their support network, including other care providers. Patient is comfortable with making a decision regarding study participation today.  As outlined in the informed consent form, this Nurse and Sara Browning discussed the purpose of the research study, the investigational nature of the study, study procedures and requirements for study participation, potential risks and benefits of study participation, as well as alternatives to participation. This study is not blinded. The patient understands participation is voluntary and they may withdraw from study participation at any time.  This study does not involve randomization.  This study does not involve an investigational drug or device. This study does not involve a placebo. Patient understands enrollment is pending full eligibility review.   Confidentiality and how the patient's information will be used as part of study participation were discussed.  Patient was informed there is reimbursement provided for their time and effort spent on trial participation.  The  patient is encouraged to discuss research study participation with their insurance provider to determine what costs they may incur as part of study participation, including research related injury.    All questions were answered to patient's satisfaction.  The informed consent with embedded HIPAA language was reviewed page by page.  The patient's mental and emotional status is appropriate to provide informed consent, and the patient verbalizes an understanding of study participation.  Patient has agreed to participate in the above listed research study and has voluntarily signed the informed consent date 05/05/20 (revised 05/21/21) with embedded HIPAA language, version date 05/05/20 (revised 05/21/21)  on 07/09/22 at 12:08PM.  The patient was provided with a copy of the signed informed consent form with embedded HIPAA language for their reference.  No study specific procedures were obtained prior to the signing of the informed consent document.  Approximately 25 minutes were spent with the patient reviewing the informed consent documents.  Patient was not requested to complete a Release of Information form.  Wells Guiles 'Learta Codding' Neysa Bonito, RN, BSN, [facility] Clinical Research Nurse I 07/09/22 12:40 PM

## 2022-07-09 NOTE — Research (Signed)
[employer] 2021-05 - Specimen Collection Study to Evaluate Biomarkers in Subjects with Cancer   ELIGIBILITY CHECK  Eligibility: Eligibility criteria reviewed with patient. This Nurse has reviewed this patient's inclusion and exclusion criteria and confirmed patient is eligible for study participation. Eligibility confirmed by treating investigator, who also agrees that patient should proceed with enrollment. Patient will continue with enrollment.  Patient confirmed the following verbally today: - They are willing/able to comply with all study procedures. - They have not been/are not currently enrolled in another [employer] example collection study or another cohort within this study   Reviewed exclusion criteria for 'all subjects' as well as 'treatment naive cohort' with patient today who verbally denied all.  Confirmed with chart review as well.  Per MD I[place]ku patient has no condition that would interfere with ability to fully participate in enrollment or protocol.  Patient is pre-menopausal with her LMP occurring 06/21/2022.  Blood Collection: Research blood obtained by [facility] venipuncture per patient's preference. Patient tolerated well without any adverse events.  Gift Card: $50 gift card given to patient for her participation in this study.   DATA COLLECTION  Medical History:  High Blood Pressure  No Coronary Artery Disease No Lupus    No Rheumatoid Arthritis  No Diabetes   No      If yes, which type?      N/A Lynch Syndrome  No  Is the patient currently taking a magnesium supplement?   No If yes, dose and frequency? N/A Indication? N/A Start date? N/A  Does the patient have a personal history of cancer (greater than 5 years ago)?  No If yes, Cancer type and date of diagnosis?   N/A  Has this previous diagnosis been treated? N/A      If so, treatment type? N/A   Start and end dates of last treatment cycle? N/A  Does the patient have a family history of cancer in 1st  or 2nd degree relatives? No If yes, Relationship(s) and Cancer type(s)? N/A  Does the patient have history of alcohol consumption? Yes   If yes, current or former? Current If former, year stopped? N/A Number of years? 23 Drinks per week? Seldom (less than 1 drink per week)  Does the patient have history of cigarette, cigar, pipe, or chewing tobacco use?  No  If yes, current for former? N/A If yes, type (Cigarette, cigar, pipe, and/or chewing tobacco)? N/   If former, year stopped? N/A Number of years? N/A Packs/number/containers per day? N/A  Wells Guiles '[place], RN, BSN, [facility] Clinical Research Nurse I 07/09/22 1:39 PM

## 2022-07-09 NOTE — Research (Signed)
[employer] 2021-05 - Specimen Collection Study to Evaluate Biomarkers in Subjects with Cancer   This Nurse has reviewed this patient's inclusion and exclusion criteria as a second review and confirms Sara Browning is eligible for study participation.  Patient may continue with enrollment.  Foye Spurling, BSN, RN, [place] Nurse II [phone] 07/09/2022

## 2022-07-09 NOTE — Progress Notes (Signed)
[facility] NOTE  Patient Care Team: Charlynn Court, NP as PCP - General (Nurse Practitioner) Kennith Gain, MD as Consulting Physician (Allergy)  CHIEF COMPLAINTS/PURPOSE OF CONSULTATION:  Newly diagnosed breast cancer  HISTORY OF PRESENTING ILLNESS:  Sara Browning 47 y.o. 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  She went to her primary doctor had further evaluation with diagnostic mammogram which confirmed left breast mass.  Biopsy was done on the left breast 1:00 mass 6 cm from the nipple which showed grade 3 IDC, ER/PR strongly positive, HER2 1+ by IHC Ki-67 of 70%.  Lymph node biopsy from the left axilla was negative for malignancy.  They recommended an MRI.  Patient lives in [place] but did not want to continue care in [place] hence referrals were made to medical oncology and surgical oncology in [place].  She arrived to the appointment today with her husband.  She currently works as a Financial trader for [employer].  She was never deployed even while in [employer] but worked with several solvents.  She has 3 children, age at first birth of 61.  No history of breast-feeding.  No history of prolonged use of birth control.  She is adopted hence does not know her family history on her maternal side but no evidence of breast cancer or other cancers from paternal side.  She is originally from [place] and tells me that cancers are not really coming back there. 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  She does not follow-up with ID locally.  Rest of the pertinent 10 point ROS reviewed and negative   MEDICAL HISTORY:  Past Medical History:  Diagnosis Date   Hepatitis B carrier ([place])    Urticaria     SURGICAL HISTORY: Past Surgical History:  Procedure Laterality Date   CESAREAN SECTION      twice    SOCIAL HISTORY: Social History   Socioeconomic History   Marital status: Married    Spouse name: Not on file   Number of children: Not on file   Years of education: Not on file   Highest education level: Not on file  Occupational History   Not on file  Tobacco Use   Smoking status: Never   Smokeless tobacco: Never   Tobacco comments:    Drinks seldom  Substance and Sexual Activity   Alcohol use: Not on file   Drug use: Not on file   Sexual activity: Not on file  Other Topics Concern   Not on file  Social History Narrative   Not on file   Social Determinants of Health   Financial Resource Strain: Not on file  Food Insecurity: Not on file  Transportation Needs: Not on file  Physical Activity: Not on file  Stress: Not on file  Social Connections: Not on file  Intimate Partner Violence: Not on file    FAMILY HISTORY: Family History  Problem Relation Age of Onset   Hypertension Paternal Grandmother    Diabetes Paternal Grandfather     ALLERGIES:  has No Known Allergies.  MEDICATIONS:  Current Outpatient Medications  Medication Sig Dispense Refill   albuterol (VENTOLIN HFA) 108 (90 Base) MCG/ACT inhaler Inhale two puffs every 4-6 hours if needed for cough or wheeze. 1 each 1   Multiple Vitamin (MULTIVITAMIN) tablet Take 1 tablet by  mouth daily.     No current facility-administered medications for this visit.    REVIEW OF SYSTEMS:   Constitutional: Denies fevers, chills or abnormal night sweats Eyes: Denies blurriness of vision, double vision or watery eyes Ears, nose, mouth, throat, and face: Denies mucositis or sore throat Respiratory: Denies cough, dyspnea or wheezes Cardiovascular: Denies palpitation, chest discomfort or lower extremity swelling Gastrointestinal:  Denies nausea, heartburn or change in bowel habits Skin: Denies abnormal skin rashes Lymphatics: Denies new lymphadenopathy or easy bruising Neurological:Denies numbness, tingling or  new weaknesses Behavioral/Psych: Mood is stable, no new changes  Breast: Palpable breast lump in the left breast All other systems were reviewed with the patient and are negative.  PHYSICAL EXAMINATION: ECOG PERFORMANCE STATUS: 0 - Asymptomatic  Vitals:   07/09/22 1049  BP: (!) 112/56  Pulse: 78  Resp: 18  Temp: 97.9 F (36.6 C)  SpO2: 100%   Filed Weights   07/09/22 1049  Weight: 150 lb 4.8 oz (68.2 kg)    GENERAL:alert, no distress and comfortable SKIN: skin color, texture, turgor are normal, no rashes or significant lesions EYES: normal, conjunctiva are pink and non-injected, sclera clear OROPHARYNX:no exudate, no erythema and lips, buccal mucosa, and tongue normal  NECK: supple, thyroid normal size, non-tender, without nodularity LYMPH:  no palpable lymphadenopathy in the cervical, axillary LUNGS: clear to auscultation and percussion with normal breathing effort HEART: regular rate & rhythm and no murmurs and no lower extremity edema ABDOMEN:abdomen soft, non-tender and normal bowel sounds Musculoskeletal:no cyanosis of digits and no clubbing  PSYCH: alert & oriented x 3 with fluent speech NEURO: no focal motor/sensory deficits BREAST: Palpable breast lump in the left breast upper outer quadrant measuring around 2 and half centimeters.  No palpable regional adenopathy.  Rest of the bilateral breast normal.  LABORATORY DATA:  I have reviewed the data as listed Lab Results  Component Value Date   WBC 7.1 03/28/2020   HGB 14.9 03/28/2020   HCT 43.6 03/28/2020   MCV 84 03/28/2020   PLT 257 03/28/2020   Lab Results  Component Value Date   NA 136 03/28/2020   K 4.5 03/28/2020   CL 101 03/28/2020   CO2 23 03/28/2020    RADIOGRAPHIC STUDIES: I have personally reviewed the radiological reports and agreed with the findings in the report.  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  I agree with the considering breast MRI for accurate sizing.  Given strong ER/PR positivity despite high-grade and high proliferation index, if she is indeed node-negative is appropriate to continue or proceed with surgery upfront.  Following surgery, we can consider Oncotype testing and if she has Oncotype score greater than 26, we will proceed with chemotherapy.  If Oncotype score less than 25 or equal to 25, we can consider enrolling her in the offset trial which is currently aiming to study in addition of adjuvant chemo to ovarian function suppression with endocrine therapy in premenopausal patients with node-negative to node positive ER positive HER2 negative breast cancer and an Oncotype recurrence score less than or equal to 25.  We have discussed that she is likely going  to need some adjuvant chemotherapy given several high risk features.  I have however not wild on the details of chemotherapy today.  She is agreeable to all the recommendations.  I recommended genetic testing given young age and she is agreeable.  We will place a referral to genetics.  She should return to clinic after surgery for further discussion.  She will receive adjuvant antiestrogen therapy after completing radiation likely for 5 to 10 years.  Total time spent: 45 minutes including history, physical exam, review of records, counseling and coordination of care All questions were answered. The patient knows to call the clinic with any problems, questions or concerns.    Benay Pike, MD 07/09/22

## 2022-07-10 ENCOUNTER — Telehealth: Payer: Self-pay | Admitting: Hematology and Oncology

## 2022-07-10 NOTE — Telephone Encounter (Signed)
Per 3/20 IB reached out to patient to schedule, patient aware of date and time of appointment.

## 2022-07-11 ENCOUNTER — Inpatient Hospital Stay (HOSPITAL_BASED_OUTPATIENT_CLINIC_OR_DEPARTMENT_OTHER): Payer: BC Managed Care – PPO | Admitting: Genetic Counselor

## 2022-07-11 ENCOUNTER — Other Ambulatory Visit: Payer: Self-pay | Admitting: Genetic Counselor

## 2022-07-11 ENCOUNTER — Encounter: Payer: Self-pay | Admitting: Genetic Counselor

## 2022-07-11 ENCOUNTER — Telehealth: Payer: Self-pay | Admitting: *Deleted

## 2022-07-11 ENCOUNTER — Inpatient Hospital Stay: Payer: BC Managed Care – PPO

## 2022-07-11 ENCOUNTER — Other Ambulatory Visit: Payer: Self-pay

## 2022-07-11 DIAGNOSIS — Z1379 Encounter for other screening for genetic and chromosomal anomalies: Secondary | ICD-10-CM

## 2022-07-11 DIAGNOSIS — Z853 Personal history of malignant neoplasm of breast: Secondary | ICD-10-CM | POA: Diagnosis not present

## 2022-07-11 DIAGNOSIS — C50412 Malignant neoplasm of upper-outer quadrant of left female breast: Secondary | ICD-10-CM

## 2022-07-11 DIAGNOSIS — Z800 Family history of malignant neoplasm of digestive organs: Secondary | ICD-10-CM

## 2022-07-11 DIAGNOSIS — Z803 Family history of malignant neoplasm of breast: Secondary | ICD-10-CM

## 2022-07-11 DIAGNOSIS — Z170 Estrogen receptor positive status [ER+]: Secondary | ICD-10-CM

## 2022-07-11 LAB — GENETIC SCREENING ORDER

## 2022-07-11 NOTE — Progress Notes (Signed)
REFERRING PROVIDER: Benay Pike, MD  PRIMARY PROVIDER:  Charlynn Court, NP  PRIMARY REASON FOR VISIT:  1. Malignant neoplasm of upper-outer quadrant of left breast in female, estrogen receptor positive ([place])   2. Family history of breast cancer     HISTORY OF PRESENT ILLNESS:   [name], a 47 y.o. female, was seen for a [place] cancer genetics consultation at the request of Dr. Chryl Heck due to a personal and family history of cancer.  Sara Browning presents to clinic today to discuss the possibility of a hereditary predisposition to cancer, to discuss genetic testing, and to further clarify her future cancer risks, as well as potential cancer risks for family members.   In February 2024, at the age of 62, Sara Browning was diagnosed with invasive ductal carcinoma of the left breast (ER/PR positive, HER2 negative).  CANCER HISTORY:  Oncology History  Malignant neoplasm of upper-outer quadrant of left breast in female, estrogen receptor positive ([place])  07/09/2022 Initial Diagnosis   Malignant neoplasm of upper-outer quadrant of left breast in female, estrogen receptor positive ([place])   07/09/2022 Cancer Staging   Staging form: Breast, AJCC 8th Edition - Clinical: Stage IIA (cT2, cN0, cM0, G3, ER+, PR+, HER2-) - Signed by Benay Pike, MD on 07/09/2022 Histologic grading system: 3 grade system      RISK FACTORS:  Menarche was at age 73.  First live birth at age 41.  OCP use for approximately 5 years.  Ovaries intact: yes.  Uterus intact: yes.  Menopausal status: premenopausal.  HRT use: 0 years. Colonoscopy: no Mammogram within the last year: yes. Any excessive radiation exposure in the past: no  Past Medical History:  Diagnosis Date   Hepatitis B carrier ([place])    Urticaria     Past Surgical History:  Procedure Laterality Date   CESAREAN SECTION     twice    Social History   Socioeconomic History   Marital status: Married    Spouse name: Not on file   Number  of children: Not on file   Years of education: Not on file   Highest education level: Not on file  Occupational History   Not on file  Tobacco Use   Smoking status: Never   Smokeless tobacco: Never   Tobacco comments:    Drinks seldom  Substance and Sexual Activity   Alcohol use: Not on file   Drug use: Not on file   Sexual activity: Not on file  Other Topics Concern   Not on file  Social History Narrative   Not on file   Social Determinants of Health   Financial Resource Strain: Not on file  Food Insecurity: Not on file  Transportation Needs: Not on file  Physical Activity: Not on file  Stress: Not on file  Social Connections: Not on file     FAMILY HISTORY:  We obtained a detailed, 4-generation family history.  Significant diagnoses are listed below: Family History  Problem Relation Age of Onset   Liver cancer Maternal Aunt    Breast cancer Maternal Grandmother    Hypertension Paternal Grandmother    Diabetes Paternal Grandfather       [name]'s maternal grandmother was diagnosed with breast cancer at an unknown age, she died due to metastatic breast cancer. Her maternal aunt was diagnosed with liver cancer at an unknown age, she is deceased. [name] is unaware of previous family history of genetic testing for hereditary cancer risks. There is no reported Ashkenazi  Jewish ancestry.  GENETIC COUNSELING ASSESSMENT: Sara Browning is a 47 y.o. female with a personal and family history of cancer which is somewhat suggestive of a hereditary cancer syndrome and predisposition to cancer given her young age at diagnosis. We, therefore, discussed and recommended the following at today's visit.   DISCUSSION: We discussed that 5 - 10% of cancer is hereditary, with most cases of hereditary breast cancer associated with mutations in BRCA1/2.  There are other genes that can be associated with hereditary breast cancer syndromes. Type of cancer risk and level of risk are  gene-specific. We discussed that testing is beneficial for several reasons including knowing how to follow individuals after completing their treatment, identifying whether potential treatment options would be beneficial, and understanding if other family members could be at risk for cancer and allowing them to undergo genetic testing.   We reviewed the characteristics, features and inheritance patterns of hereditary cancer syndromes. We also discussed genetic testing, including the appropriate family members to test, the process of testing, insurance coverage and turn-around-time for results. We discussed the implications of a negative, positive and/or variant of uncertain significant result. In order to get genetic test results in a timely manner so that [name] can use these genetic test results for surgical decisions, we recommended Sara Browning pursue genetic testing for the Breast Cancer STAT panel. Once complete, we recommend Sara Browning pursue reflex genetic testing to a more comprehensive gene panel.   [name]  was offered a common hereditary cancer panel (48 genes) and an expanded pan-cancer panel (70 genes). Sara Browning was informed of the benefits and limitations of each panel, including that expanded pan-cancer panels contain genes that do not have clear management guidelines at this point in time.  We also discussed that as the number of genes included on a panel increases, the chances of variants of uncertain significance increases.  After considering the benefits and limitations of each gene panel, Sara Browning elected to have [facility] Panel.  The Multi-Cancer + RNA Panel offered by [facility] includes sequencing and/or deletion/duplication analysis of the following 70 genes:  AIP*, ALK, APC*, ATM*, AXIN2*, BAP1*, BARD1*, BLM*, BMPR1A*, BRCA1*, BRCA2*, BRIP1*, CDC73*, CDH1*, CDK4, CDKN1B*, CDKN2A, CHEK2*, CTNNA1*, DICER1*, EPCAM (del/dup only), EGFR, FH*, FLCN*, GREM1  (promoter dup only), HOXB13, KIT, LZTR1, MAX*, MBD4, MEN1*, MET, MITF, MLH1*, MSH2*, MSH3*, MSH6*, MUTYH*, NF1*, NF2*, NTHL1*, PALB2*, PDGFRA, PMS2*, POLD1*, POLE*, POT1*, PRKAR1A*, PTCH1*, PTEN*, RAD51C*, RAD51D*, RB1*, RET, SDHA* (sequencing only), SDHAF2*, SDHB*, SDHC*, SDHD*, SMAD4*, SMARCA4*, SMARCB1*, SMARCE1*, STK11*, SUFU*, TMEM127*, TP53*, TSC1*, TSC2*, VHL*. RNA analysis is performed for * genes.  Based on Ms. Keady's personal and family history of cancer, she meets medical criteria for genetic testing. Despite that she meets criteria, she may still have an out of pocket cost. We discussed that if her out of pocket cost for testing is over $100, the laboratory should contact them to discuss self-pay prices, patient pay assistance programs, if applicable, and other billing options.   PLAN: After considering the risks, benefits, and limitations, Ms. Dryman provided informed consent to pursue genetic testing and the blood sample was sent to [facility] for analysis of the Multi-Cancer Panel. Results should be available within approximately 1-2 weeks' time, at which point they will be disclosed by telephone to Ms. Mckiernan, as will any additional recommendations warranted by these results. Ms. Cervin will receive a summary of her genetic counseling visit and a copy of her results once available. This information will also  be available in Epic.   Ms. Thi questions were answered to her satisfaction today. Our contact information was provided should additional questions or concerns arise. Thank you for the referral and allowing [place] to share in the care of your patient.   Lucille Passy, MS, [facility] Genetic Counselor [email or web address] (P) [phone]  The patient was seen for a total of 35 minutes in face-to-face genetic counseling.The patient was seen alone.  Drs. Lindi Adie and/or Burr Medico were available to discuss this case as needed.   _______________________________________________________________________ For Office Staff:  Number of people involved in session: 1 Was an Intern/ student involved with case: no

## 2022-07-11 NOTE — Telephone Encounter (Signed)
Called pt to provide navigation resources and contact information. Left vm with the about. Request return call with questions or needs.

## 2022-07-11 NOTE — Telephone Encounter (Signed)
Pt left message wanting to inform MD that she has found out that her biological grand mother who is passed away had breast cancer.  Of note pt states she is adopted and family history is difficult to assess.  No further data left on VM per above.  Pt is scheduled for genetics today.  This message will be forwarded to Genetics and MD for review of information- no further needs at this time.

## 2022-07-12 ENCOUNTER — Encounter: Payer: Self-pay | Admitting: *Deleted

## 2022-07-12 ENCOUNTER — Other Ambulatory Visit: Payer: Self-pay | Admitting: Surgery

## 2022-07-12 ENCOUNTER — Telehealth: Payer: Self-pay | Admitting: Hematology and Oncology

## 2022-07-12 DIAGNOSIS — C50412 Malignant neoplasm of upper-outer quadrant of left female breast: Secondary | ICD-10-CM

## 2022-07-12 DIAGNOSIS — C50912 Malignant neoplasm of unspecified site of left female breast: Secondary | ICD-10-CM

## 2022-07-12 NOTE — Telephone Encounter (Signed)
Left patient a vm regarding upcoming appointments  

## 2022-07-15 ENCOUNTER — Encounter: Payer: Self-pay | Admitting: *Deleted

## 2022-07-15 ENCOUNTER — Telehealth: Payer: Self-pay | Admitting: Radiation Oncology

## 2022-07-15 DIAGNOSIS — Z170 Estrogen receptor positive status [ER+]: Secondary | ICD-10-CM

## 2022-07-15 NOTE — Telephone Encounter (Signed)
3/25 sent via stat fax request for breast images to be push to [facility] and also images report to be fax from [facility].

## 2022-07-16 ENCOUNTER — Inpatient Hospital Stay
Admission: RE | Admit: 2022-07-16 | Discharge: 2022-07-16 | Disposition: A | Discharge status: Home / Self Care | Payer: Self-pay | Source: Ambulatory Visit | Attending: Radiation Oncology | Admitting: Radiation Oncology

## 2022-07-16 ENCOUNTER — Other Ambulatory Visit: Payer: Self-pay | Admitting: Radiation Oncology

## 2022-07-16 DIAGNOSIS — Z170 Estrogen receptor positive status [ER+]: Secondary | ICD-10-CM

## 2022-07-19 ENCOUNTER — Inpatient Hospital Stay: Payer: BC Managed Care – PPO | Admitting: Licensed Clinical Social Worker

## 2022-07-19 NOTE — Progress Notes (Signed)
[place] Work  Initial Assessment   Sara Browning is a 47 y.o. year old female contacted by phone. Clinical Social Work was referred by new patient protocol for assessment of psychosocial needs.   SDOH (Social Determinants of Health) assessments performed: Yes SDOH Interventions    Flowsheet Row Clinical Support from 07/19/2022 in [place] at [facility]  SDOH Interventions   Food Insecurity Interventions Intervention Not Indicated  Housing Interventions Intervention Not Indicated  Transportation Interventions Intervention Not Indicated       SDOH Screenings   Food Insecurity: No Food Insecurity (07/19/2022)  Housing: Low Risk  (07/19/2022)  Transportation Needs: No Transportation Needs (07/19/2022)  Tobacco Use: Low Risk  (07/11/2022)     Distress Screen completed: No     No data to display            Family/Social Information:  Housing Arrangement: patient lives with spouse and 80yo. 2 older kids (66 & 73yo are in college) Family members/support persons in your life? Family Transportation concerns: no  Employment: Working full time- working on getting paperwork for leave.  Income source: Employment Financial concerns: No Type of concern: None Food access concerns: no Religious or spiritual practice: Not known Services Currently in place:  [employer]  Coping/ Adjustment to diagnosis: Patient understands treatment plan and what happens next? Yes. Preparing for surgery. Telling extended family members in ways that feel good for her and are not causing her to feel pitied or treated as sick. Concerns about diagnosis and/or treatment:  Wants to support her kids and make sure they are not too stressed Patient reported stressors: Adjusting to my illness Current coping skills/ strengths: Ability for insight , Communication skills , and Supportive family/friends     SUMMARY: Current SDOH Barriers:  No major barriers noted  today  Clinical Social Work Clinical Goal(s):  Increase knowledge of additional support for pt's children  Interventions: Discussed common feeling and emotions when being diagnosed with cancer, and the importance of support during treatment Informed patient of the support team roles and support services at [facility] Provided CSW contact information and encouraged patient to call with any questions or concerns Provided patient with information about national programs for supporting children whose parents have cancer   Follow Up Plan: Patient will contact CSW with any support or resource needs Patient verbalizes understanding of plan: Yes    Sara Browning E Syble Picco, LCSW

## 2022-07-22 NOTE — Progress Notes (Signed)
Location of Breast Cancer: Malignant neoplasm of upper-outer quadrant of left breast in female, estrogen receptor positive   Histology per Pathology Report:  (Waiting on definitive pathology from 08/01/22 lumpectomy to result)  06-19-22   Receptor Status: ER(80%), PR (90%), Her2-neu (1+), Ki-67(70%)  Did patient present with symptoms (if so, please note symptoms) or was this found on screening mammography?: (per Dr. 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 She went to her primary doctor had further evaluation with diagnostic mammogram which confirmed left breast mass.   Past/Anticipated interventions by surgeon, if any: 08/02/2022 --Dr. Maisie Fus Cornett\ Left breast seed localized lumpectomy with  excision deep left axillary sentinel lymph node wit  mapping using mag trace   Past/Anticipated interventions by medical oncology, if any:  Dr. Al Pimple 07-24-22 I agree with the considering breast MRI for accurate sizing.   Given strong ER/PR positivity despite high-grade and high proliferation index, if she is indeed node-negative is appropriate to continue or proceed with surgery upfront.   Following surgery, we can consider Oncotype testing  If she has Oncotype score greater than 26, we will proceed with chemotherapy.   If Oncotype score less than 25 or equal to 25, we can consider enrolling her in the ofset trial which is currently aiming to study addition of adjuvant chemo to ovarian function suppression with endocrine therapy in premenopausal patients with node-negative to node positive ER positive HER2 negative breast cancer and an Oncotype recurrence score less than or equal to 25.   She is here for an interim visit because of some ongoing pain, burning and increase in size of the tumor.   She is very concerned that the tumor may grow larger before her anticipated date of surgery which is end  of April.   On physical exam, there is a slight change in size of the tumor but no overt significant difference noted.   I have however sent an in basket message to Dr. Luisa Hart about his recommendations given the patient concern.   She should hear back from the office.  She was encouraged to call us back with any new questions or concerns.  Lymphedema issues, if any:  Reports swelling to axilla, but reports it's improving with icing and compression bra  Pain issues, if any: Since surgery was just yesterday, breast and axilla are tender but manageable   SAFETY ISSUES: Prior radiation?  No Pacemaker/ICD? No Possible current pregnancy?  LMP: 07/19/2022 Is the patient on methotrexate? No  Current Complaints / other details:  Scheduled for lumpectomy on 08-15-22 with Dr. Luisa Hart  (Per Dr. Remonia Richter note)   She is adopted hence does not know her family history on her maternal side but no evidence of breast cancer or other cancers from paternal side.

## 2022-07-23 ENCOUNTER — Encounter: Payer: Self-pay | Admitting: Hematology and Oncology

## 2022-07-24 ENCOUNTER — Inpatient Hospital Stay: Payer: BC Managed Care – PPO | Attending: Hematology and Oncology | Admitting: Hematology and Oncology

## 2022-07-24 ENCOUNTER — Other Ambulatory Visit: Payer: Self-pay

## 2022-07-24 VITALS — BP 123/70 | HR 88 | Temp 97.7°F | Resp 18 | Ht 63.0 in | Wt 151.9 lb

## 2022-07-24 DIAGNOSIS — C50412 Malignant neoplasm of upper-outer quadrant of left female breast: Secondary | ICD-10-CM | POA: Diagnosis not present

## 2022-07-24 DIAGNOSIS — Z803 Family history of malignant neoplasm of breast: Secondary | ICD-10-CM | POA: Insufficient documentation

## 2022-07-24 DIAGNOSIS — B181 Chronic viral hepatitis B without delta-agent: Secondary | ICD-10-CM | POA: Diagnosis not present

## 2022-07-24 DIAGNOSIS — Z800 Family history of malignant neoplasm of digestive organs: Secondary | ICD-10-CM | POA: Diagnosis not present

## 2022-07-24 DIAGNOSIS — Z170 Estrogen receptor positive status [ER+]: Secondary | ICD-10-CM | POA: Diagnosis not present

## 2022-07-24 NOTE — Progress Notes (Signed)
[place] NOTE  Patient Care Team: Charlynn Court, NP as PCP - General (Nurse Practitioner) Kennith Gain, MD as Consulting Physician (Allergy) Mauro Kaufmann, RN as Oncology Nurse Navigator Rockwell Germany, RN as Oncology Nurse Navigator Benay Pike, MD as Consulting Physician (Hematology and Oncology)  CHIEF COMPLAINTS/PURPOSE OF CONSULTATION:  Newly diagnosed breast cancer  HISTORY OF PRESENTING ILLNESS:  Sara Browning 47 y.o. 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  She went to her primary doctor had further evaluation with diagnostic mammogram which confirmed left breast mass.  Biopsy was done on the left breast 1:00 mass 6 cm from the nipple which showed grade 3 IDC, ER/PR strongly positive, HER2 1+ by IHC Ki-67 of 70%.  Lymph node biopsy from the left axilla was negative for malignancy.  They recommended an MRI.  Patient lives in [place] but did not want to continue care in [place] hence referrals were made to medical oncology and surgical oncology in [place].  She arrived to the appointment today with her husband.  She currently works as a Financial trader for [employer].  She was never deployed even while in [employer] but worked with several solvents.  She has 3 children, age at first birth of 52.  No history of breast-feeding.  No history of prolonged use of birth control.  She is adopted hence does not know her family history on her maternal side but no evidence of breast cancer or other cancers from paternal side.  She is originally from [place] and tells me that cancers are not really common back there. 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  She does not follow-up with ID locally.    She called [place] yesterday about some ongoing pain in her left breast and she being worried  that the tumor is growing bigger.  She is worried that she has to wait till end of April for surgery and was hoping that she could see [place].  Since her phone call yesterday she took some ibuprofen and applied some ice and this has helped with the burning and the pain.  No other complaints. MEDICAL HISTORY:  Past Medical History:  Diagnosis Date   Hepatitis B carrier ([place])    Urticaria     SURGICAL HISTORY: Past Surgical History:  Procedure Laterality Date   CESAREAN SECTION     twice    SOCIAL HISTORY: Social History   Socioeconomic History   Marital status: Married    Spouse name: Not on file   Number of children: Not on file   Years of education: Not on file   Highest education level: Not on file  Occupational History   Not on file  Tobacco Use   Smoking status: Never   Smokeless tobacco: Never   Tobacco comments:    Drinks seldom  Substance and Sexual Activity   Alcohol use: Not on file   Drug use: Not on file   Sexual activity: Not on file  Other Topics Concern   Not on file  Social History Narrative   Not on file   Social Determinants of Health   Financial Resource Strain: Not on file  Food Insecurity: No Food Insecurity (07/19/2022)   Hunger Vital Sign    Worried About Running Out of Food in the Last Year: Never true  Ran Out of Food in the Last Year: Never true  Transportation Needs: No Transportation Needs (07/19/2022)   PRAPARE - Hydrologist (Medical): No    Lack of Transportation (Non-Medical): No  Physical Activity: Not on file  Stress: Not on file  Social Connections: Not on file  Intimate Partner Violence: Not on file    FAMILY HISTORY: Family History  Problem Relation Age of Onset   Liver cancer Maternal Aunt    Breast cancer Maternal Grandmother    Hypertension Paternal Grandmother    Diabetes Paternal Grandfather     ALLERGIES:  has No Known Allergies.  MEDICATIONS:  Current Outpatient Medications   Medication Sig Dispense Refill   albuterol (VENTOLIN HFA) 108 (90 Base) MCG/ACT inhaler Inhale two puffs every 4-6 hours if needed for cough or wheeze. 1 each 1   Multiple Vitamin (MULTIVITAMIN) tablet Take 1 tablet by mouth daily.     No current facility-administered medications for this visit.    REVIEW OF SYSTEMS:   Constitutional: Denies fevers, chills or abnormal night sweats Eyes: Denies blurriness of vision, double vision or watery eyes Ears, nose, mouth, throat, and face: Denies mucositis or sore throat Respiratory: Denies cough, dyspnea or wheezes Cardiovascular: Denies palpitation, chest discomfort or lower extremity swelling Gastrointestinal:  Denies nausea, heartburn or change in bowel habits Skin: Denies abnormal skin rashes Lymphatics: Denies new lymphadenopathy or easy bruising Neurological:Denies numbness, tingling or new weaknesses Behavioral/Psych: Mood is stable, no new changes  Breast: Palpable breast lump in the left breast All other systems were reviewed with the patient and are negative.  PHYSICAL EXAMINATION: ECOG PERFORMANCE STATUS: 0 - Asymptomatic  Vitals:   07/24/22 1220  BP: 123/70  Pulse: 88  Resp: 18  Temp: 97.7 F (36.5 C)  SpO2: 100%   Filed Weights   07/24/22 1220  Weight: 151 lb 14.4 oz (68.9 kg)    GENERAL:alert, no distress and comfortable BREAST: Palpable breast lump in the left breast upper outer quadrant measuring around 3 cm, may be slightly larger than last visit but no significant change.  No palpable regional adenopathy.  Rest of the bilateral breast normal.  LABORATORY DATA:  I have reviewed the data as listed Lab Results  Component Value Date   WBC 7.1 03/28/2020   HGB 14.9 03/28/2020   HCT 43.6 03/28/2020   MCV 84 03/28/2020   PLT 257 03/28/2020   Lab Results  Component Value Date   NA 136 03/28/2020   K 4.5 03/28/2020   CL 101 03/28/2020   CO2 23 03/28/2020    RADIOGRAPHIC STUDIES: I have personally reviewed  the radiological reports and agreed with the findings in the report.  ASSESSMENT AND PLAN:  Malignant neoplasm of upper-outer quadrant of left breast in female, estrogen receptor positive ([place]) This is a very pleasant 47 year old premenopausal female patient with no significant family history of breast cancer with newly diagnosed left breast upper outer quadrant T2 N0 M0 grade 3 IDC, ER/PR strongly positive HER2 1+ by IHC Ki-67 of 70% referred to medical oncology and surgical oncology for recommendations.  I agree with the considering breast MRI for accurate sizing.  Given strong ER/PR positivity despite high-grade and high proliferation index, if she is indeed node-negative is appropriate to continue or proceed with surgery upfront.  Following surgery, we can consider Oncotype testing and if she has Oncotype score greater than 26, we will proceed with chemotherapy.  If Oncotype score less than 25 or equal to  25, we can consider enrolling her in the ofset trial which is currently aiming to study addition of adjuvant chemo to ovarian function suppression with endocrine therapy in premenopausal patients with node-negative to node positive ER positive HER2 negative breast cancer and an Oncotype recurrence score less than or equal to 25.    She is here for an interim visit because of some ongoing pain, burning and increase in size of the tumor.  She is very concerned that the tumor may grow larger before her anticipated date of surgery which is end of April.  On physical exam, there is a slight change in size of the tumor but no overt significant difference noted.  I have however sent an in basket message to Dr. Brantley Stage about his recommendations given the patient concern.  She should hear back from the office.  She was encouraged to call us back with any new questions or concerns.   Total time spent: 15 minutes including history, physical exam, review of records, counseling and coordination of care All  questions were answered. The patient knows to call the clinic with any problems, questions or concerns.    Benay Pike, MD 07/24/22

## 2022-07-24 NOTE — Assessment & Plan Note (Signed)
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  I agree with the considering breast MRI for accurate sizing.  Given strong ER/PR positivity despite high-grade and high proliferation index, if she is indeed node-negative is appropriate to continue or proceed with surgery upfront.  Following surgery, we can consider Oncotype testing and if she has Oncotype score greater than 26, we will proceed with chemotherapy.  If Oncotype score less than 25 or equal to 25, we can consider enrolling her in the ofset trial which is currently aiming to study addition of adjuvant chemo to ovarian function suppression with endocrine therapy in premenopausal patients with node-negative to node positive ER positive HER2 negative breast cancer and an Oncotype recurrence score less than or equal to 25.    She is here for an interim visit because of some ongoing pain, burning and increase in size of the tumor.  She is very concerned that the tumor may grow larger before her anticipated date of surgery which is end of April.  On physical exam, there is a slight change in size of the tumor but no overt significant difference noted.  I have however sent an in basket message to Dr. Brantley Stage about his recommendations given the patient concern.  She should hear back from the office.  She was encouraged to call us back with any new questions or concerns.

## 2022-07-25 ENCOUNTER — Encounter: Payer: Self-pay | Admitting: Genetic Counselor

## 2022-07-25 ENCOUNTER — Telehealth: Payer: Self-pay | Admitting: Genetic Counselor

## 2022-07-25 ENCOUNTER — Telehealth: Payer: Self-pay | Admitting: Radiation Oncology

## 2022-07-25 DIAGNOSIS — Z1379 Encounter for other screening for genetic and chromosomal anomalies: Secondary | ICD-10-CM | POA: Insufficient documentation

## 2022-07-25 NOTE — Telephone Encounter (Signed)
Received message from [place] stating sx will be 4/11 and asking to move 4/12 CON with Dr. Isidore Moos. Contacted Sara Browning who stated she is comfortable with 4/12 CON since it will be via MyChart and is meant to establish care and explain XRT process. Sara Browning was advised if she is not feeling well 4/12 to contact us to r/s CON. Sara Browning verbalized understanding, all parties notified.

## 2022-07-25 NOTE — Telephone Encounter (Signed)
I attempted to contact [name] to discuss her genetic testing results (70 genes). I left a voicemail requesting she call me back at [phone].  Lucille Passy, MS, [facility] Genetic Counselor [email or web address] (P) [phone]

## 2022-07-30 ENCOUNTER — Other Ambulatory Visit: Payer: Self-pay | Admitting: Surgery

## 2022-07-30 ENCOUNTER — Ambulatory Visit: Payer: BC Managed Care – PPO | Attending: Hematology and Oncology | Admitting: Rehabilitation

## 2022-07-30 ENCOUNTER — Other Ambulatory Visit: Payer: Self-pay

## 2022-07-30 ENCOUNTER — Encounter: Payer: Self-pay | Admitting: Rehabilitation

## 2022-07-30 ENCOUNTER — Ambulatory Visit
Admission: RE | Admit: 2022-07-30 | Discharge: 2022-07-30 | Disposition: A | Discharge status: Home / Self Care | Payer: BC Managed Care – PPO | Source: Ambulatory Visit | Attending: Surgery | Admitting: Surgery

## 2022-07-30 DIAGNOSIS — C50912 Malignant neoplasm of unspecified site of left female breast: Secondary | ICD-10-CM

## 2022-07-30 DIAGNOSIS — Z170 Estrogen receptor positive status [ER+]: Secondary | ICD-10-CM | POA: Diagnosis not present

## 2022-07-30 DIAGNOSIS — C50412 Malignant neoplasm of upper-outer quadrant of left female breast: Secondary | ICD-10-CM | POA: Insufficient documentation

## 2022-07-30 DIAGNOSIS — R293 Abnormal posture: Secondary | ICD-10-CM | POA: Diagnosis not present

## 2022-07-30 HISTORY — PX: BREAST BIOPSY: SHX20

## 2022-07-30 NOTE — Progress Notes (Signed)
Per [facility], a urine pregnancy test was not needed on this patient prior to her breast seed being placed today, April 9th. Per the BCG tech who worked with patient today, pt stated her LMP was 07/19/22, which, per their office protocol, is within the time frame to NOT require a urine pregnancy test prior to breast seed placement.

## 2022-07-30 NOTE — Therapy (Addendum)
OUTPATIENT PHYSICAL THERAPY BREAST CANCER BASELINE EVALUATION   Patient Name: Sara Browning MRN: [id number] DOB:[birth year], 47 y.o., female Today's Date: 07/30/2022  END OF SESSION:  PT End of Session - 07/30/22 1149     Visit Number 1    Number of Visits 2    Date for PT Re-Evaluation 09/10/22    Authorization Type None needed    PT Start Time 1000    PT Stop Time 1032    PT Time Calculation (min) 32 min    Activity Tolerance Patient tolerated treatment well    Behavior During Therapy [facility] for tasks assessed/performed             Past Medical History:  Diagnosis Date   Hepatitis B carrier    Urticaria    Past Surgical History:  Procedure Laterality Date   BREAST BIOPSY Left 07/30/2022   [place] LT RADIOACTIVE SEED LOC 07/30/2022 [facility] MAMMOGRAPHY   CESAREAN SECTION     twice   Patient Active Problem List   Diagnosis Date Noted   Genetic testing 07/25/2022   Malignant neoplasm of upper-outer quadrant of left breast in female, estrogen receptor positive 07/09/2022    PCP: Syble Creek NP  REFERRING PROVIDER: [name], MD  REFERRING DIAG: left breast cancer   THERAPY DIAG:  Malignant neoplasm of upper-outer quadrant of left breast in female, estrogen receptor positive - Plan: PT plan of care cert/re-cert  Abnormal posture - Plan: PT plan of care cert/re-cert  Rationale for Evaluation and Treatment: Rehabilitation  ONSET DATE: 07/22/22  SUBJECTIVE:                                                                                                                                                                                           SUBJECTIVE STATEMENT: Patient reports she is here today to be seen by her medical team for her newly diagnosed left breast cancer.  I do have shoulder problems on the left but mild.    PERTINENT HISTORY:  Pt will be having a left lumpectomy and SLNB on 08/01/22 due to grade 3 IDC ER/PR positive Ki67 of 70%.  Hep B carrier    PATIENT GOALS:   reduce lymphedema risk and learn post op HEP.   PAIN:  Are you having pain? No  PRECAUTIONS: Active CA   HAND DOMINANCE: right  WEIGHT BEARING RESTRICTIONS: No  FALLS:  Has patient fallen in last 6 months? No  LIVING ENVIRONMENT: Patient lives with: lives with spouse  OCCUPATION: computer work   LEISURE: kayaking and paddleboarding   PRIOR LEVEL OF FUNCTION: Independent   OBJECTIVE:  COGNITION: Overall cognitive status:  Within functional limits for tasks assessed    POSTURE:  Forward head and rounded shoulders posture  UPPER EXTREMITY AROM/PROM:  A/PROM RIGHT   eval   Shoulder extension 80  Shoulder flexion 173  Shoulder abduction 175  Shoulder internal rotation   Shoulder external rotation 95    (Blank rows = not tested)  A/PROM LEFT   eval  Shoulder extension 58  Shoulder flexion 160  Shoulder abduction 163  Shoulder internal rotation   Shoulder external rotation 92    (Blank rows = not tested)  UPPER EXTREMITY STRENGTH: 5/5 except 4/5 Rt ER no pain for any   LYMPHEDEMA ASSESSMENTS:   LANDMARK RIGHT   eval  10 cm proximal to olecranon process 30.6  Olecranon process 24.5  10 cm proximal to ulnar styloid process 21.1  Just proximal to ulnar styloid process 14.8  Across hand at thumb web space 18.7  At base of 2nd digit 6.2  (Blank rows = not tested)  LANDMARK LEFT   eval  10 cm proximal to olecranon process 30.3  Olecranon process 24  10 cm proximal to ulnar styloid process 20.3  Just proximal to ulnar styloid process 14.5  Across hand at thumb web space 19.2  At base of 2nd digit 6.3  (Blank rows = not tested)  L-DEX LYMPHEDEMA SCREENING: The patient was assessed using the L-Dex machine today to produce a lymphedema index baseline score. The patient will be reassessed on a regular basis (typically every 3 months) to obtain new L-Dex scores. If the score is > 6.5 points away from his/her baseline score indicating  onset of subclinical lymphedema, it will be recommended to wear a compression garment for 4 weeks, 12 hours per day and then be reassessed. If the score continues to be > 6.5 points from baseline at reassessment, we will initiate lymphedema treatment. Assessing in this manner has a 95% rate of preventing clinically significant lymphedema.  L-DEX FLOWSHEETS - 07/30/22 1200       L-DEX LYMPHEDEMA SCREENING   Measurement Type Unilateral    L-DEX MEASUREMENT EXTREMITY Upper Extremity    POSITION  Standing    DOMINANT SIDE Right    At Risk Side Left    BASELINE SCORE (UNILATERAL) -1.8             QUICK DASH SURVEY: 4.5% disability at baseline   PATIENT EDUCATION:  Education details: Lymphedema risk reduction and post op shoulder/posture HEP Person educated: Patient Education method: Explanation, Demonstration, Handout Education comprehension: Patient verbalized understanding and returned demonstration  HOME EXERCISE PROGRAM: Patient was instructed today in a home exercise program today for post op shoulder range of motion. These included active assist shoulder flexion in sitting, scapular retraction, wall walking with shoulder abduction, and hands behind head external rotation.  She was encouraged to do these twice a day, holding 3 seconds and repeating 5 times when permitted by her physician.   ASSESSMENT:  CLINICAL IMPRESSION: Pt will benefit from a post op PT reassessment to determine needs and from L-Dex screens every 3 months for 2 years to detect subclinical lymphedema.  Pt will benefit from skilled therapeutic intervention to improve on the following deficits: Decreased knowledge of precautions, impaired UE functional use, pain, decreased ROM, postural dysfunction.   PT treatment/interventions: ADL/self-care home management, pt/family education, therapeutic exercise  REHAB POTENTIAL: Excellent  CLINICAL DECISION MAKING: Stable/uncomplicated  EVALUATION COMPLEXITY:  Low   GOALS: Goals reviewed with patient? YES  LONG TERM GOALS: (STG=LTG)    Name  Target Date Goal status  1 Pt will be able to verbalize understanding of pertinent lymphedema risk reduction practices relevant to her dx specifically related to skin care.  Baseline:  No knowledge 07/30/2022 Achieved at eval  2 Pt will be able to return demo and/or verbalize understanding of the post op HEP related to regaining shoulder ROM. Baseline:  No knowledge 07/30/2022 Achieved at eval  3 Pt will be able to verbalize understanding of the importance of attending the post op After Breast CA Class for further lymphedema risk reduction education and therapeutic exercise.  Baseline:  No knowledge 07/30/2022 Achieved at eval  4 Pt will demo she has regained full shoulder ROM and function post operatively compared to baselines.  Baseline: See objective measurements taken today. 09/10/22     PLAN:  PT FREQUENCY/DURATION: EVAL and 1 follow up appointment.   PLAN FOR NEXT SESSION: will reassess 3-4 weeks post op to determine needs.   Patient will follow up at outpatient cancer rehab 3-4 weeks following surgery.  If the patient requires physical therapy at that time, a specific plan will be dictated and sent to the referring physician for approval. The patient was educated today on appropriate basic range of motion exercises to begin post operatively and the importance of attending the After Breast Cancer class following surgery.  Patient was educated today on lymphedema risk reduction practices as it pertains to recommendations that will benefit the patient immediately following surgery.  She verbalized good understanding.    Physical Therapy Information for After Breast Cancer Surgery/Treatment:  Lymphedema is a swelling condition that you may be at risk for in your arm if you have lymph nodes removed from the armpit area.  After a sentinel node biopsy, the risk is approximately 5-9% and is higher after an  axillary node dissection.  There is treatment available for this condition and it is not life-threatening.  Contact your physician or physical therapist with concerns. You may begin the 4 shoulder/posture exercises (see additional sheet) when permitted by your physician (typically a week after surgery).  If you have drains, you may need to wait until those are removed before beginning range of motion exercises.  A general recommendation is to not lift your arms above shoulder height until drains are removed.  These exercises should be done to your tolerance and gently.  This is not a "no pain/no gain" type of recovery so listen to your body and stretch into the range of motion that you can tolerate, stopping if you have pain.  If you are having immediate reconstruction, ask your plastic surgeon about doing exercises as he or she may want you to wait. We encourage you to attend the free one time ABC (After Breast Cancer) class offered by [facility].  You will learn information related to lymphedema risk, prevention and treatment and additional exercises to regain mobility following surgery.  You can call [phone] for more information.  This is offered the 1st and 3rd Monday of each month.  You only attend the class one time. While undergoing any medical procedure or treatment, try to avoid blood pressure being taken or needle sticks from occurring on the arm on the side of cancer.   This recommendation begins after surgery and continues for the rest of your life.  This may help reduce your risk of getting lymphedema (swelling in your arm). An excellent resource for those seeking information on lymphedema is the National Lymphedema Network's web site. It can  be accessed at www.lymphnet.org If you notice swelling in your hand, arm or breast at any time following surgery (even if it is many years from now), please contact your doctor or physical therapist to discuss this.  Lymphedema  can be treated at any time but it is easier for you if it is treated early on.  If you feel like your shoulder motion is not returning to normal in a reasonable amount of time, please contact your surgeon or physical therapist.  [facility] [phone]. [address], [place] [address]  ABC CLASS After Breast Cancer Class  After Breast Cancer Class is a specially designed exercise class to assist you in a safe recover after having breast cancer surgery.  In this class you will learn how to get back to full function whether your drains were just removed or if you had surgery a month ago.  This one-time class is held the 1st and 3rd Monday of every month from 11:00 a.m. until 12:00 noon virtually.  This class is FREE and space is limited. For more information or to register for the next available class, call [phone].  Class Goals  Understand specific stretches to improve the flexibility of you chest and shoulder. Learn ways to safely strengthen your upper body and improve your posture. Understand the warning signs of infection and why you may be at risk for an arm infection. Learn about Lymphedema and prevention.  ** You do not attend this class until after surgery.  Drains must be removed to participate  Patient was instructed today in a home exercise program today for post op shoulder range of motion. These included active assist shoulder flexion in sitting, scapular retraction, wall walking with shoulder abduction, and hands behind head external rotation.  She was encouraged to do these twice a day, holding 3 seconds and repeating 5 times when permitted by her physician.    Idamae Lusher, PT 07/30/2022, 12:41 PM

## 2022-07-30 NOTE — Pre-Procedure Instructions (Signed)
Surgical Instructions    Your procedure is scheduled on August 01, 2022.  Report to [facility] Main Entrance "A" at 7:30 A.M., then check in with the Admitting office.  Call this number if you have problems the morning of surgery:  [phone]  If you have any questions prior to your surgery date call [phone]: Open Monday-Friday 8am-4pm If you experience any cold or flu symptoms such as cough, fever, chills, shortness of breath, etc. between now and your scheduled surgery, please notify us at the above number.     Remember:  Do not eat after midnight the night before your surgery  You may drink clear liquids until 6:30 AM the morning of your surgery.   Clear liquids allowed are: Water, Non-Citrus Juices (without pulp), Carbonated Beverages, Clear Tea, Black Coffee Only (NO MILK, CREAM OR POWDERED CREAMER of any kind), and Gatorade.     Take these medicines the morning of surgery with A SIP OF WATER:  loratadine (CLARITIN) - may take if needed   As of today, STOP taking any Aspirin (unless otherwise instructed by your surgeon) Aleve, Naproxen, Ibuprofen, Motrin, Advil, Goody's, BC's, all herbal medications, fish oil, and all vitamins.                     Do NOT Smoke (Tobacco/Vaping) for 24 hours prior to your procedure.  If you use a CPAP at night, you may bring your mask/headgear for your overnight stay.   Contacts, glasses, piercing's, hearing aid's, dentures or partials may not be worn into surgery, please bring cases for these belongings.    For patients admitted to the hospital, discharge time will be determined by your treatment team.   Patients discharged the day of surgery will not be allowed to drive home, and someone needs to stay with them for 24 hours.  SURGICAL WAITING ROOM VISITATION Patients having surgery or a procedure may have no more than 2 support people in the waiting area - these visitors may rotate.   Children under the age of 39 must have an adult  with them who is not the patient. If the patient needs to stay at the hospital during part of their recovery, the visitor guidelines for inpatient rooms apply. Pre-op nurse will coordinate an appropriate time for 1 support person to accompany patient in pre-op.  This support person may not rotate.   Please refer to the [facility] website for the visitor guidelines for Inpatients (after your surgery is over and you are in a regular room).    Special instructions:   [facility]- Preparing For Surgery  Before surgery, you can play an important role. Because skin is not sterile, your skin needs to be as free of germs as possible. You can reduce the number of germs on your skin by washing with CHG (chlorahexidine gluconate) Soap before surgery.  CHG is an antiseptic cleaner which kills germs and bonds with the skin to continue killing germs even after washing.    Oral Hygiene is also important to reduce your risk of infection.  Remember - BRUSH YOUR TEETH THE MORNING OF SURGERY WITH YOUR REGULAR TOOTHPASTE  Please do not use if you have an allergy to CHG or antibacterial soaps. If your skin becomes reddened/irritated stop using the CHG.  Do not shave (including legs and underarms) for at least 48 hours prior to first CHG shower. It is OK to shave your face.  Please follow these instructions carefully.   Shower the [employer]  BEFORE SURGERY and the MORNING OF SURGERY  If you chose to wash your hair, wash your hair first as usual with your normal shampoo.  After you shampoo, rinse your hair and body thoroughly to remove the shampoo.  Use CHG Soap as you would any other liquid soap. You can apply CHG directly to the skin and wash gently with a scrungie or a clean washcloth.   Apply the CHG Soap to your body ONLY FROM THE NECK DOWN.  Do not use on open wounds or open sores. Avoid contact with your eyes, ears, mouth and genitals (private parts). Wash Face and genitals (private parts)  with your normal  soap.   Wash thoroughly, paying special attention to the area where your surgery will be performed.  Thoroughly rinse your body with warm water from the neck down.  DO NOT shower/wash with your normal soap after using and rinsing off the CHG Soap.  Pat yourself dry with a CLEAN TOWEL.  Wear CLEAN PAJAMAS to bed the night before surgery  Place CLEAN SHEETS on your bed the night before your surgery  DO NOT SLEEP WITH PETS.   Day of Surgery: Take a shower with CHG soap. Do not wear jewelry or makeup Do not wear lotions, powders, perfumes/colognes, or deodorant. Do not shave 48 hours prior to surgery.  Men may shave face and neck. Do not bring valuables to the hospital.  [facility] is not responsible for any belongings or valuables. Do not wear nail polish, gel polish, artificial nails, or any other type of covering on natural nails (fingers and toes) If you have artificial nails or gel coating that need to be removed by a nail salon, please have this removed prior to surgery. Artificial nails or gel coating may interfere with anesthesia's ability to adequately monitor your vital signs.  Wear Clean/Comfortable clothing the morning of surgery Remember to brush your teeth WITH YOUR REGULAR TOOTHPASTE.   Please read over the following fact sheets that you were given.    If you received a COVID test during your pre-op visit  it is requested that you wear a mask when out in public, stay away from anyone that may not be feeling well and notify your surgeon if you develop symptoms. If you have been in contact with anyone that has tested positive in the last 10 days please notify you surgeon.

## 2022-07-31 ENCOUNTER — Telehealth: Payer: Self-pay | Admitting: Genetic Counselor

## 2022-07-31 ENCOUNTER — Encounter (HOSPITAL_COMMUNITY): Payer: Self-pay

## 2022-07-31 ENCOUNTER — Encounter (HOSPITAL_COMMUNITY)
Admission: RE | Admit: 2022-07-31 | Discharge: 2022-07-31 | Disposition: A | Discharge status: Home / Self Care | Payer: BC Managed Care – PPO | Source: Ambulatory Visit | Attending: Surgery | Admitting: Surgery

## 2022-07-31 ENCOUNTER — Other Ambulatory Visit: Payer: Self-pay

## 2022-07-31 VITALS — BP 104/48 | HR 78 | Temp 97.7°F | Resp 18 | Ht 63.0 in | Wt 153.4 lb

## 2022-07-31 DIAGNOSIS — C50912 Malignant neoplasm of unspecified site of left female breast: Secondary | ICD-10-CM | POA: Insufficient documentation

## 2022-07-31 DIAGNOSIS — K759 Inflammatory liver disease, unspecified: Secondary | ICD-10-CM | POA: Diagnosis not present

## 2022-07-31 DIAGNOSIS — Z170 Estrogen receptor positive status [ER+]: Secondary | ICD-10-CM | POA: Diagnosis not present

## 2022-07-31 DIAGNOSIS — C50412 Malignant neoplasm of upper-outer quadrant of left female breast: Secondary | ICD-10-CM | POA: Diagnosis not present

## 2022-07-31 DIAGNOSIS — Z01818 Encounter for other preprocedural examination: Secondary | ICD-10-CM | POA: Insufficient documentation

## 2022-07-31 DIAGNOSIS — K769 Liver disease, unspecified: Secondary | ICD-10-CM | POA: Insufficient documentation

## 2022-07-31 DIAGNOSIS — B181 Chronic viral hepatitis B without delta-agent: Secondary | ICD-10-CM | POA: Insufficient documentation

## 2022-07-31 HISTORY — DX: Inflammatory liver disease, unspecified: K75.9

## 2022-07-31 HISTORY — DX: Pneumonia, unspecified organism: J18.9

## 2022-07-31 LAB — CBC
HCT: 39.8 % (ref 36.0–46.0)
Hemoglobin: 13.4 g/dL (ref 12.0–15.0)
MCH: 28.8 pg (ref 26.0–34.0)
MCHC: 33.7 g/dL (ref 30.0–36.0)
MCV: 85.6 fL (ref 80.0–100.0)
Platelets: 253 10*3/uL (ref 150–400)
RBC: 4.65 MIL/uL (ref 3.87–5.11)
RDW: 12 % (ref 11.5–15.5)
WBC: 7 10*3/uL (ref 4.0–10.5)
nRBC: 0 % (ref 0.0–0.2)

## 2022-07-31 LAB — COMPREHENSIVE METABOLIC PANEL
ALT: 16 U/L (ref 0–44)
AST: 28 U/L (ref 15–41)
Albumin: 3.6 g/dL (ref 3.5–5.0)
Alkaline Phosphatase: 61 U/L (ref 38–126)
Anion gap: 4 — ABNORMAL LOW (ref 5–15)
BUN: 16 mg/dL (ref 6–20)
CO2: 27 mmol/L (ref 22–32)
Calcium: 8.8 mg/dL — ABNORMAL LOW (ref 8.9–10.3)
Chloride: 102 mmol/L (ref 98–111)
Creatinine, Ser: 0.85 mg/dL (ref 0.44–1.00)
GFR, Estimated: >60 mL/min (ref >60)
Glucose, Bld: 99 mg/dL (ref 70–99)
Potassium: 4.1 mmol/L (ref 3.5–5.1)
Sodium: 133 mmol/L — ABNORMAL LOW (ref 135–145)
Total Bilirubin: 0.6 mg/dL (ref 0.3–1.2)
Total Protein: 7.1 g/dL (ref 6.5–8.1)

## 2022-07-31 NOTE — Progress Notes (Signed)
PCP - Syble Creek, NP Cardiologist - Denies  PPM/ICD - Denies Device Orders - n/a Rep Notified - n/a  Chest x-ray - n/a EKG - Denies Stress Test - Denies ECHO - Denies Cardiac Cath - Denies  Sleep Study - Denies CPAP - n/a  No DM  Last dose of GLP1 agonist- n/a  GLP1 instructions: n/a  Blood Thinner Instructions: n/a Aspirin Instructions: n/a  ERAS Protcol - Clear liquids until 0630 morning of surgery PRE-SURGERY Ensure or G2- n/a  COVID TEST- n/a   Anesthesia review: Yes. Breast seed placement 07/30/2022   Patient denies shortness of breath, fever, cough and chest pain at PAT appointment. Pt denies any respiratory illness/infection in the last two months.   All instructions explained to the patient, with a verbal understanding of the material. Patient agrees to go over the instructions while at home for a better understanding. Patient also instructed to self quarantine after being tested for COVID-19. The opportunity to ask questions was provided.

## 2022-07-31 NOTE — Anesthesia Preprocedure Evaluation (Addendum)
Anesthesia Evaluation  Patient identified by MRN, date of birth, ID band Patient awake    Reviewed: Allergy & Precautions, NPO status , Patient's Chart, lab work & pertinent test results  Airway Mallampati: II  TM Distance: >3 FB Neck ROM: Full    Dental no notable dental hx. (+) Teeth Intact   Pulmonary pneumonia, resolved   Pulmonary exam normal breath sounds clear to auscultation       Cardiovascular negative cardio ROS Normal cardiovascular exam Rhythm:Regular Rate:Normal     Neuro/Psych negative neurological ROS  negative psych ROS   GI/Hepatic negative GI ROS,,,(+) Hepatitis -, BCarrier   Endo/Other  Left Breast Ca  Renal/GU   negative genitourinary   Musculoskeletal negative musculoskeletal ROS (+)    Abdominal   Peds  Hematology negative hematology ROS (+)   Anesthesia Other Findings   Reproductive/Obstetrics                             Anesthesia Physical Anesthesia Plan  ASA: 2  Anesthesia Plan: General   Post-op Pain Management: Minimal or no pain anticipated and Regional block*   Induction: Intravenous  PONV Risk Score and Plan: 4 or greater and Treatment may vary due to age or medical condition and Ondansetron  Airway Management Planned: LMA  Additional Equipment: None  Intra-op Plan:   Post-operative Plan: Extubation in OR  Informed Consent: I have reviewed the patients History and Physical, chart, labs and discussed the procedure including the risks, benefits and alternatives for the proposed anesthesia with the patient or authorized representative who has indicated his/her understanding and acceptance.     Dental advisory given  Plan Discussed with: CRNA and Anesthesiologist  Anesthesia Plan Comments: (PAT note written 07/31/2022 by Shonna Chock, PA-C.  )       Anesthesia Quick Evaluation

## 2022-07-31 NOTE — Telephone Encounter (Signed)
I contacted Sara Browning to discuss her genetic testing results. No pathogenic variants were identified in the 70 genes analyzed. Of note, a variant of uncertain significance was identified in the HOXB13 and MUTYH genes. Detailed clinic note to follow.  The test report has been scanned into EPIC and is located under the Molecular Pathology section of the Results Review tab.  A portion of the result report is included below for reference.   Lalla Brothers, MS, [facility] Genetic Counselor [email or web address] (P) [phone]

## 2022-07-31 NOTE — Progress Notes (Signed)
Anesthesia Chart Review:  Case: [id number] Date/Time: 08/01/22 0915   Procedure: LEFT BREAST LUMPECTOMY WITH RADIOACTIVE SEED AND SENTINEL LYMPH NODE BIOPSY (Left: Breast)   Anesthesia type: General   Pre-op diagnosis: LEFT BREAST CANCER   [facility]: [facility] ROOM 08 / [facility] OR   Surgeons: Harriette Bouillon, MD       DISCUSSION: Patient is a 47 year old female scheduled for the above procedure.  History includes never smoker, hepatitis B carrier, left breast cancer (06/19/22 left breast biopsy: IDC), urticaria.  RSL was already placed on 07/30/22 prior to her 07/31/22 PAT visit. Per PAT RN note, "Per [facility], a urine pregnancy test was not needed on this patient prior to her breast seed being placed today, April 9th. Per the BCG tech who worked with patient today, pt stated her LMP was 07/19/22, which, per their office protocol, is within the time frame to NOT require a urine pregnancy test prior to breast seed placement." She will get a urine pregnancy test on the day of surgery.  Anesthesia team to evaluate on the day of surgery.    VS: BP (!) 104/48   Pulse 78   Temp 36.5 C (Oral)   Resp 18   Ht 5\' 3"  (1.6 m)   Wt 69.6 kg   LMP 07/19/2022 (Exact Date) Comment: LMP ended April 1st  SpO2 100%   BMI 27.17 kg/m    PROVIDERS: [name], NP is PCP  [name], MD is HEM-ONC   LABS: Labs reviewed: Acceptable for surgery. (all labs ordered are listed, but only abnormal results are displayed)  Labs Reviewed  COMPREHENSIVE METABOLIC PANEL - Abnormal; Notable for the following components:      Result Value   Sodium 133 (*)    Calcium 8.8 (*)    Anion gap 4 (*)    All other components within normal limits  CBC    EKG: N/A   CV: N/A  Past Medical History:  Diagnosis Date   Cancer 2024   Left Breat Cancer   Hepatitis    Hep B Carrier   Hepatitis B carrier    Pneumonia    Hx   Urticaria     Past Surgical History:  Procedure Laterality Date    BREAST BIOPSY Left 07/30/2022   [place] LT RADIOACTIVE SEED LOC 07/30/2022 [facility] MAMMOGRAPHY   CESAREAN SECTION     twice    MEDICATIONS:  BETA GLUCAN PO   ibuprofen (ADVIL) 200 MG tablet   loratadine (CLARITIN) 10 MG tablet   Melatonin 5 MG CHEW   Multiple Vitamin (MULTIVITAMIN) tablet   OVER THE COUNTER MEDICATION   OVER THE COUNTER MEDICATION   No current facility-administered medications for this encounter.    Shonna Chock, PA-C Surgical Short Stay/Anesthesiology [facility] Phone [phone] [facility] Phone [phone] 07/31/2022 12:09 PM

## 2022-08-01 ENCOUNTER — Encounter (HOSPITAL_COMMUNITY)
Admission: RE | Disposition: A | Discharge status: Home / Self Care | Payer: Self-pay | Source: Home / Self Care | Attending: Surgery

## 2022-08-01 ENCOUNTER — Ambulatory Visit (HOSPITAL_COMMUNITY)
Admission: RE | Admit: 2022-08-01 | Discharge: 2022-08-01 | Disposition: A | Discharge status: Home / Self Care | Payer: BC Managed Care – PPO | Attending: Surgery | Admitting: Surgery

## 2022-08-01 ENCOUNTER — Encounter (HOSPITAL_COMMUNITY): Payer: Self-pay | Admitting: Surgery

## 2022-08-01 ENCOUNTER — Ambulatory Visit (HOSPITAL_COMMUNITY): Payer: BC Managed Care – PPO | Admitting: Vascular Surgery

## 2022-08-01 ENCOUNTER — Ambulatory Visit
Admission: RE | Admit: 2022-08-01 | Discharge: 2022-08-01 | Disposition: A | Discharge status: Home / Self Care | Payer: Self-pay | Source: Ambulatory Visit | Attending: Surgery | Admitting: Surgery

## 2022-08-01 ENCOUNTER — Other Ambulatory Visit: Payer: Self-pay

## 2022-08-01 ENCOUNTER — Ambulatory Visit (HOSPITAL_COMMUNITY): Payer: BC Managed Care – PPO | Admitting: Anesthesiology

## 2022-08-01 DIAGNOSIS — C50912 Malignant neoplasm of unspecified site of left female breast: Secondary | ICD-10-CM

## 2022-08-01 DIAGNOSIS — Z170 Estrogen receptor positive status [ER+]: Secondary | ICD-10-CM | POA: Diagnosis not present

## 2022-08-01 DIAGNOSIS — C50412 Malignant neoplasm of upper-outer quadrant of left female breast: Secondary | ICD-10-CM | POA: Insufficient documentation

## 2022-08-01 DIAGNOSIS — K759 Inflammatory liver disease, unspecified: Secondary | ICD-10-CM | POA: Diagnosis not present

## 2022-08-01 DIAGNOSIS — G8918 Other acute postprocedural pain: Secondary | ICD-10-CM | POA: Diagnosis not present

## 2022-08-01 DIAGNOSIS — R928 Other abnormal and inconclusive findings on diagnostic imaging of breast: Secondary | ICD-10-CM | POA: Diagnosis not present

## 2022-08-01 HISTORY — PX: BREAST LUMPECTOMY WITH RADIOACTIVE SEED AND SENTINEL LYMPH NODE BIOPSY: SHX6550

## 2022-08-01 LAB — POCT PREGNANCY, URINE: Preg Test, Ur: NEGATIVE

## 2022-08-01 SURGERY — BREAST LUMPECTOMY WITH RADIOACTIVE SEED AND SENTINEL LYMPH NODE BIOPSY
Anesthesia: General | Site: Breast | Laterality: Left

## 2022-08-01 MED ORDER — BUPIVACAINE HCL (PF) 0.5 % IJ SOLN
INTRAMUSCULAR | Status: DC | PRN
  Administered 2022-08-01: 15 mL via PERINEURAL

## 2022-08-01 MED ORDER — FENTANYL CITRATE (PF) 250 MCG/5ML IJ SOLN
INTRAMUSCULAR | Status: AC
  Filled 2022-08-01: qty 5

## 2022-08-01 MED ORDER — METHYLENE BLUE 1 % INJ SOLN
INTRAVENOUS | Status: AC
  Filled 2022-08-01: qty 10

## 2022-08-01 MED ORDER — CEFAZOLIN SODIUM-DEXTROSE 2-4 GM/100ML-% IV SOLN
2.0000 g | INTRAVENOUS | Status: AC
  Administered 2022-08-01: 2 g via INTRAVENOUS
  Filled 2022-08-01: qty 100

## 2022-08-01 MED ORDER — BUPIVACAINE LIPOSOME 1.3 % IJ SUSP
INTRAMUSCULAR | Status: DC | PRN
  Administered 2022-08-01: 10 mL via PERINEURAL

## 2022-08-01 MED ORDER — PROPOFOL 10 MG/ML IV BOLUS
INTRAVENOUS | Status: DC | PRN
  Administered 2022-08-01: 200 mg via INTRAVENOUS

## 2022-08-01 MED ORDER — EPHEDRINE 5 MG/ML INJ
INTRAVENOUS | Status: AC
  Filled 2022-08-01: qty 5

## 2022-08-01 MED ORDER — BUPIVACAINE-EPINEPHRINE (PF) 0.5% -1:200000 IJ SOLN
INTRAMUSCULAR | Status: AC
  Filled 2022-08-01: qty 30

## 2022-08-01 MED ORDER — EPHEDRINE SULFATE-NACL 50-0.9 MG/10ML-% IV SOSY
PREFILLED_SYRINGE | INTRAVENOUS | Status: DC | PRN
  Administered 2022-08-01: 10 mg via INTRAVENOUS

## 2022-08-01 MED ORDER — FENTANYL CITRATE (PF) 250 MCG/5ML IJ SOLN
INTRAMUSCULAR | Status: DC | PRN
  Administered 2022-08-01 (×2): 25 ug via INTRAVENOUS
  Administered 2022-08-01: 50 ug via INTRAVENOUS
  Administered 2022-08-01 (×2): 25 ug via INTRAVENOUS

## 2022-08-01 MED ORDER — PROPOFOL 10 MG/ML IV BOLUS
INTRAVENOUS | Status: AC
  Filled 2022-08-01: qty 20

## 2022-08-01 MED ORDER — CHLORHEXIDINE GLUCONATE CLOTH 2 % EX PADS
6.0000 | MEDICATED_PAD | Freq: Once | CUTANEOUS | Status: AC
  Administered 2022-08-01: 6 via TOPICAL

## 2022-08-01 MED ORDER — CHLORHEXIDINE GLUCONATE CLOTH 2 % EX PADS: 6.0000 | MEDICATED_PAD | Freq: Once | CUTANEOUS | Status: DC

## 2022-08-01 MED ORDER — GLYCOPYRROLATE PF 0.2 MG/ML IJ SOSY
PREFILLED_SYRINGE | INTRAMUSCULAR | Status: AC
  Filled 2022-08-01: qty 1

## 2022-08-01 MED ORDER — GLYCOPYRROLATE PF 0.2 MG/ML IJ SOSY
PREFILLED_SYRINGE | INTRAMUSCULAR | Status: DC | PRN
  Administered 2022-08-01: .2 mg via INTRAVENOUS

## 2022-08-01 MED ORDER — ACETAMINOPHEN 500 MG PO TABS
1000.0000 mg | ORAL_TABLET | ORAL | Status: AC
  Administered 2022-08-01: 1000 mg via ORAL
  Filled 2022-08-01: qty 2

## 2022-08-01 MED ORDER — DEXAMETHASONE SODIUM PHOSPHATE 10 MG/ML IJ SOLN
INTRAMUSCULAR | Status: DC | PRN
  Administered 2022-08-01: 10 mg via INTRAVENOUS

## 2022-08-01 MED ORDER — MIDAZOLAM HCL 2 MG/2ML IJ SOLN
INTRAMUSCULAR | Status: AC
  Filled 2022-08-01: qty 2

## 2022-08-01 MED ORDER — MIDAZOLAM HCL 2 MG/2ML IJ SOLN
INTRAMUSCULAR | Status: DC | PRN
  Administered 2022-08-01: 2 mg via INTRAVENOUS

## 2022-08-01 MED ORDER — OXYCODONE HCL 5 MG PO TABS: 5.0000 mg | ORAL_TABLET | Freq: Once | ORAL | Status: AC | PRN

## 2022-08-01 MED ORDER — IBUPROFEN 800 MG PO TABS
800.0000 mg | ORAL_TABLET | Freq: Three times a day (TID) | ORAL | 0 refills | Status: DC | PRN
Start: 2022-08-01 — End: 2022-10-02

## 2022-08-01 MED ORDER — ORAL CARE MOUTH RINSE: 15.0000 mL | Freq: Once | OROMUCOSAL | Status: AC

## 2022-08-01 MED ORDER — PHENYLEPHRINE HCL-NACL 20-0.9 MG/250ML-% IV SOLN
INTRAVENOUS | Status: DC | PRN
  Administered 2022-08-01: 50 ug/min via INTRAVENOUS
  Administered 2022-08-01: 120 ug via INTRAVENOUS

## 2022-08-01 MED ORDER — ONDANSETRON HCL 4 MG/2ML IJ SOLN
INTRAMUSCULAR | Status: AC
  Filled 2022-08-01: qty 2

## 2022-08-01 MED ORDER — LACTATED RINGERS IV SOLN: INTRAVENOUS | Status: DC

## 2022-08-01 MED ORDER — AMISULPRIDE (ANTIEMETIC) 5 MG/2ML IV SOLN: 10.0000 mg | Freq: Once | INTRAVENOUS | Status: DC | PRN

## 2022-08-01 MED ORDER — OXYCODONE HCL 5 MG PO TABS
5.0000 mg | ORAL_TABLET | Freq: Four times a day (QID) | ORAL | 0 refills | Status: DC | PRN
Start: 2022-08-01 — End: 2023-12-23

## 2022-08-01 MED ORDER — OXYCODONE HCL 5 MG/5ML PO SOLN
ORAL | Status: AC
  Filled 2022-08-01: qty 5

## 2022-08-01 MED ORDER — FENTANYL CITRATE (PF) 100 MCG/2ML IJ SOLN
INTRAMUSCULAR | Status: AC
  Administered 2022-08-01: 50 ug
  Filled 2022-08-01: qty 2

## 2022-08-01 MED ORDER — MAGTRACE LYMPHATIC TRACER
INTRAMUSCULAR | Status: DC | PRN
  Administered 2022-08-01: 1 mL via INTRAMUSCULAR

## 2022-08-01 MED ORDER — MIDAZOLAM HCL 2 MG/2ML IJ SOLN
INTRAMUSCULAR | Status: AC
  Administered 2022-08-01: 1 mg
  Filled 2022-08-01: qty 2

## 2022-08-01 MED ORDER — DEXAMETHASONE SODIUM PHOSPHATE 10 MG/ML IJ SOLN
INTRAMUSCULAR | Status: AC
  Filled 2022-08-01: qty 1

## 2022-08-01 MED ORDER — OXYCODONE HCL 5 MG/5ML PO SOLN
5.0000 mg | Freq: Once | ORAL | Status: AC | PRN
  Administered 2022-08-01: 5 mg via ORAL

## 2022-08-01 MED ORDER — HYDROMORPHONE HCL 1 MG/ML IJ SOLN: 0.2500 mg | INTRAMUSCULAR | Status: DC | PRN

## 2022-08-01 MED ORDER — CHLORHEXIDINE GLUCONATE 0.12 % MT SOLN
15.0000 mL | Freq: Once | OROMUCOSAL | Status: AC
  Administered 2022-08-01: 15 mL via OROMUCOSAL
  Filled 2022-08-01: qty 15

## 2022-08-01 MED ORDER — LIDOCAINE 2% (20 MG/ML) 5 ML SYRINGE
INTRAMUSCULAR | Status: DC | PRN
  Administered 2022-08-01: 60 mg via INTRAVENOUS

## 2022-08-01 MED ORDER — BUPIVACAINE-EPINEPHRINE 0.5% -1:200000 IJ SOLN
INTRAMUSCULAR | Status: DC | PRN
  Administered 2022-08-01: 30 mL

## 2022-08-01 MED ORDER — LIDOCAINE 2% (20 MG/ML) 5 ML SYRINGE
INTRAMUSCULAR | Status: AC
  Filled 2022-08-01: qty 5

## 2022-08-01 MED ORDER — ONDANSETRON HCL 4 MG/2ML IJ SOLN: 4.0000 mg | Freq: Once | INTRAMUSCULAR | Status: DC | PRN

## 2022-08-01 MED ORDER — ONDANSETRON HCL 4 MG/2ML IJ SOLN
INTRAMUSCULAR | Status: DC | PRN
  Administered 2022-08-01: 4 mg via INTRAVENOUS

## 2022-08-01 SURGICAL SUPPLY — 48 items
ADH SKN CLS APL DERMABOND .7 (GAUZE/BANDAGES/DRESSINGS) ×1
ADH SKN CLS LQ APL DERMABOND (GAUZE/BANDAGES/DRESSINGS) ×1
APL PRP STRL LF DISP 70% ISPRP (MISCELLANEOUS) ×1
APPLIER CLIP 9.375 MED OPEN (MISCELLANEOUS) ×1
APR CLP MED 9.3 20 MLT OPN (MISCELLANEOUS) ×1
BAG COUNTER SPONGE SURGICOUNT (BAG) IMPLANT
BAG SPNG CNTER NS LX DISP (BAG) ×1
BINDER BREAST LRG (GAUZE/BANDAGES/DRESSINGS) IMPLANT
BINDER BREAST XLRG (GAUZE/BANDAGES/DRESSINGS) IMPLANT
CANISTER SUCT 3000ML PPV (MISCELLANEOUS) ×2 IMPLANT
CHLORAPREP W/TINT 26 (MISCELLANEOUS) ×2 IMPLANT
CLIP APPLIE 9.375 MED OPEN (MISCELLANEOUS) ×2 IMPLANT
CNTNR URN SCR LID CUP LEK RST (MISCELLANEOUS) IMPLANT
CONT SPEC 4OZ STRL OR WHT (MISCELLANEOUS) ×3
COVER PROBE W GEL 5X96 (DRAPES) ×2 IMPLANT
COVER SURGICAL LIGHT HANDLE (MISCELLANEOUS) ×2 IMPLANT
DERMABOND ADVANCED .7 DNX12 (GAUZE/BANDAGES/DRESSINGS) ×2 IMPLANT
DERMABOND ADVANCED .7 DNX6 (GAUZE/BANDAGES/DRESSINGS) IMPLANT
DEVICE DUBIN SPECIMEN MAMMOGRA (MISCELLANEOUS) ×2 IMPLANT
DRAPE CHEST BREAST 15X10 FENES (DRAPES) ×2 IMPLANT
ELECT CAUTERY BLADE 6.4 (BLADE) ×2 IMPLANT
ELECT REM PT RETURN 9FT ADLT (ELECTROSURGICAL) ×1
ELECTRODE REM PT RTRN 9FT ADLT (ELECTROSURGICAL) ×2 IMPLANT
GAUZE PAD ABD 8X10 STRL (GAUZE/BANDAGES/DRESSINGS) ×2 IMPLANT
GLOVE BIO SURGEON STRL SZ8 (GLOVE) ×2 IMPLANT
GLOVE BIOGEL PI IND STRL 8 (GLOVE) ×2 IMPLANT
GOWN STRL REUS W/ TWL LRG LVL3 (GOWN DISPOSABLE) ×2 IMPLANT
GOWN STRL REUS W/ TWL XL LVL3 (GOWN DISPOSABLE) ×2 IMPLANT
GOWN STRL REUS W/TWL LRG LVL3 (GOWN DISPOSABLE) ×1
GOWN STRL REUS W/TWL XL LVL3 (GOWN DISPOSABLE) ×1
HEMOSTAT ARISTA ABSORB 3G PWDR (HEMOSTASIS) IMPLANT
KIT BASIN OR (CUSTOM PROCEDURE TRAY) ×2 IMPLANT
KIT MARKER MARGIN INK (KITS) ×2 IMPLANT
LIGHT WAVEGUIDE WIDE FLAT (MISCELLANEOUS) IMPLANT
NDL 18GX1X1/2 (RX/OR ONLY) (NEEDLE) IMPLANT
NDL FILTER BLUNT 18X1 1/2 (NEEDLE) IMPLANT
NDL HYPO 25GX1X1/2 BEV (NEEDLE) ×2 IMPLANT
NEEDLE 18GX1X1/2 (RX/OR ONLY) (NEEDLE) IMPLANT
NEEDLE FILTER BLUNT 18X1 1/2 (NEEDLE) IMPLANT
NEEDLE HYPO 25GX1X1/2 BEV (NEEDLE) ×1 IMPLANT
NS IRRIG 1000ML POUR BTL (IV SOLUTION) ×2 IMPLANT
PACK GENERAL/GYN (CUSTOM PROCEDURE TRAY) ×2 IMPLANT
SUT MNCRL AB 4-0 PS2 18 (SUTURE) ×2 IMPLANT
SUT VIC AB 3-0 SH 18 (SUTURE) ×2 IMPLANT
SYR 3ML LL SCALE MARK (SYRINGE) IMPLANT
SYR CONTROL 10ML LL (SYRINGE) ×2 IMPLANT
TOWEL GREEN STERILE (TOWEL DISPOSABLE) ×2 IMPLANT
TOWEL GREEN STERILE FF (TOWEL DISPOSABLE) ×2 IMPLANT

## 2022-08-01 NOTE — Progress Notes (Signed)
Radiation Oncology         [phone] ________________________________  Initial Outpatient Consultation by MyChart Video.  The patient opted for telemedicine to maximize safety during the pandemic.      Name: Sara Browning MRN: [id number]  Date: 08/02/2022  DOB: 19-Apr-1976  QM:VHQION, Yolonda Kida, NP  [name], MD   REFERRING PHYSICIAN: [name], MD  DIAGNOSIS:    ICD-10-CM   1. Malignant neoplasm of upper-outer quadrant of left breast in female, estrogen receptor positive  C50.412    Z17.0        Cancer Staging  Malignant neoplasm of upper-outer quadrant of left breast in female, estrogen receptor positive Staging form: Breast, AJCC 8th Edition - Clinical: Stage IIA (cT2, cN0, cM0, G3, ER+, PR+, HER2-) - Signed by [name], MD on 07/09/2022 Histologic grading system: 3 grade system   Stage IIA (cT2, cN0, cM0) Left Breast UOQ, Invasive ductal carcinoma, ER+ / PR+ / Her2-, Grade 3  CHIEF COMPLAINT: Here to discuss management of left breast cancer  HISTORY OF PRESENT ILLNESS::Sara Browning is a 47 y.o. female who presented with a palpable left breast lump this past January-February 2024. She did have a screening mammogram performed on 12/28/20 which showed a possible abnormality in the right breast and a possible left breast distortion. However, the patient did not present for diagnostic imaging for further evaluation of these areas.   Bilateral diagnostic mammogram and left breast ultrasound on 06/13/22 demonstrated a mass in the 1 o'clock left breast located 6 cmfn, measuring 2.5 cm, corresponding to the palpable area of concern. A single borderline abnormal left axillary lymph node was also appreciated.   Biopsy of the 1 o'clock left breast on date of 06/19/22 showed grade 3 invasive ductal carcinoma measuring 1.5 cm in the greatest linear extent of the sample.  ER status: 80% positive and PR status 90% positive, both with strong staining intensity;  Proliferation marker Ki67 at 70%; Her2 status negative; Grade 3. Biopsy of the single abnormal left axillary lymph node showed no evidence of malignancy.   The patient opted to proceed with genetic testing on 07/11/22. Results showed no clinically significant variants detected by [employer] testing. However a variant of uncertain significance was identified in the HOXB13 and MUTYH genes.  The patient was accordingly referred to Dr. Luisa Hart and opted to proceed with a left breast lumpectomy and SLN biopsies on 08/01/22. Pathology from the procedure is pending at this time.    The patient has also met with Dr. Al Pimple who recommends Oncotype testing. Additionally, Dr. Al Pimple would like to consider enrolling her in the ofset trial which aims to study the addition of adjuvant chemo to ovarian function suppression with endocrine therapy in premenopausal patients with node-negative to node positive ER positive HER2 negative breast cancer, and an Oncotype recurrence score less than or equal to 25.  Of note: during a recent follow-up visit with Dr. Al Pimple on 07/24/22, the patient presented with some ongoing pain, burning, and concern that her tumor had increased in size. Physical exam performed showed a very slight change in size of the tumor but no significant overt changes.   She is coping well postoperatively.  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  PREVIOUS RADIATION THERAPY: No  PAST MEDICAL HISTORY:  has a past medical history of Cancer (2024), Hepatitis, Hepatitis B carrier, Pneumonia, and Urticaria.    PAST SURGICAL HISTORY: Past Surgical History:  Procedure Laterality Date   BREAST BIOPSY Left 07/30/2022  US LT RADIOACTIVE SEED LOC 07/30/2022 [facility] MAMMOGRAP[facility]   BREAST LUMPECTOMY WITH RADIOACTIVE SEED AND SENTINEL LYMPH NODE BIOPSY Left 08/01/2022   Procedure: LEFT BREAST LUMPECTOMY WITH RADIOACTIVE SEED AND SENTINEL LYMPH NODE BIOPSY;  Surgeon: Harriette Bouillonornett, Thomas, MD;  Location: [facility] OR;  Service:  General;  Laterality: Left;   CESAREAN SECTION     twice    FAMILY HISTORY: family history includes Breast cancer in her maternal grandmother; Diabetes in her paternal grandfather; Hypertension in her paternal grandmother; Liver cancer in her maternal aunt.  SOCIAL HISTORY:  reports that she has never smoked. She has never used smokeless tobacco. She reports that she does not currently use alcohol. She reports that she does not use drugs.  ALLERGIES: Patient has no known allergies.  MEDICATIONS:  Current Outpatient Medications  Medication Sig Dispense Refill   BETA GLUCAN PO Take 2 tablets by mouth daily.     ibuprofen (ADVIL) 200 MG tablet Take 400-600 mg by mouth every 6 (six) hours as needed for moderate pain.     ibuprofen (ADVIL) 800 MG tablet Take 1 tablet (800 mg total) by mouth every 8 (eight) hours as needed. 30 tablet 0   loratadine (CLARITIN) 10 MG tablet Take 10 mg by mouth daily as needed for allergies.     Melatonin 5 MG CHEW Chew 5 mg by mouth at bedtime as needed (sleep).     Multiple Vitamin (MULTIVITAMIN) tablet Take 1 tablet by mouth daily.     OVER THE COUNTER MEDICATION Take 2 capsules by mouth daily. Graviola supplement     OVER THE COUNTER MEDICATION Take 1 Scoop by mouth daily. prolean greens supplement powder     oxyCODONE (OXY IR/ROXICODONE) 5 MG immediate release tablet Take 1 tablet (5 mg total) by mouth every 6 (six) hours as needed for severe pain. 15 tablet 0   No current facility-administered medications for this encounter.    REVIEW OF SYSTEMS: As above in HPI.   PHYSICAL EXAM:  vitals were not taken for this visit.   General: Alert and oriented, in no acute distress      LABORATORY DATA:  Lab Results  Component Value Date   WBC 7.0 07/31/2022   HGB 13.4 07/31/2022   HCT 39.8 07/31/2022   MCV 85.6 07/31/2022   PLT 253 07/31/2022   CMP     Component Value Date/Time   NA 133 (L) 07/31/2022 0925   NA 136 03/28/2020 1118   K 4.1 07/31/2022  0925   CL 102 07/31/2022 0925   CO2 27 07/31/2022 0925   GLUCOSE 99 07/31/2022 0925   BUN 16 07/31/2022 0925   BUN 11 03/28/2020 1118   CREATININE 0.85 07/31/2022 0925   CALCIUM 8.8 (L) 07/31/2022 0925   PROT 7.1 07/31/2022 0925   PROT 7.5 03/28/2020 1118   ALBUMIN 3.6 07/31/2022 0925   ALBUMIN 4.3 03/28/2020 1118   AST 28 07/31/2022 0925   ALT 16 07/31/2022 0925   ALKPHOS 61 07/31/2022 0925   BILITOT 0.6 07/31/2022 0925   BILITOT 0.5 03/28/2020 1118   GFRNONAA >60 07/31/2022 0925   GFRAA 102 03/28/2020 1118         RADIOGRAPHY: MM Breast Surgical Specimen  Result Date: 08/01/2022 CLINICAL DATA:  Post lumpectomy specimen radiograph EXAM: SPECIMEN RADIOGRAPH OF THE LEFT BREAST COMPARISON:  Previous exam(s). FINDINGS: Status post excision of the left breast. The radioactive seed and biopsy marker clip are present, completely intact, and were marked for pathology. IMPRESSION: Specimen radiograph of the  left breast. Electronically Signed   By: Emmaline Kluver M.D.   On: 08/01/2022 10:43  MM CLIP PLACEMENT LEFT  Result Date: 07/30/2022 CLINICAL DATA:  Evaluate radioactive seed placement EXAM: 3D DIAGNOSTIC LEFT MAMMOGRAM POST ULTRASOUND GUIDED RADIOACTIVE SEED PLACEMENT COMPARISON:  Previous exam(s). FINDINGS: 3D Mammographic images were obtained following ultrasound guided radioactive seed placement into the known malignancy at 1 o'clock in the left breast. The radioactive seed is immediately adjacent to the biopsy clip within the known malignancy. IMPRESSION: The radioactive seed is immediately adjacent to the biopsy clip within the known malignancy. Final Assessment: Post Procedure Mammograms for Marker Placement Electronically Signed   By: Gerome Sam III M.D.   On: 07/30/2022 09:32  [place] LT RADIOACTIVE SEED LOC  Result Date: 07/30/2022 CLINICAL DATA:  Radioactive seed localization of a 1 o'clock left breast mass/malignancy. EXAM: ULTRASOUND GUIDED RADIOACTIVE SEED LOCALIZATION OF  THE LEFT BREAST COMPARISON:  Previous exam(s). FINDINGS: Patient presents for radioactive seed localization prior to surgery. I met with the patient and we discussed the procedure of seed localization including benefits and alternatives. We discussed the high likelihood of a successful procedure. We discussed the risks of the procedure including infection, bleeding, tissue injury and further surgery. We discussed the low dose of radioactivity involved in the procedure. Informed, written consent was given. The usual time-out protocol was performed immediately prior to the procedure. Using ultrasound guidance, sterile technique, 1% lidocaine and an I-125 radioactive seed, the 1 o'clock left breast malignancy was localized using a lateral approach. The follow-up mammogram images confirm the seed in the expected location and were marked for the surgeon. Follow-up survey of the patient confirms presence of the radioactive seed. Order number of I-125 seed:  [id number]. Total activity:  0.239 millicurie reference Date: July 10, 2022 The patient tolerated the procedure well and was [facility]. She was given instructions regarding seed removal. IMPRESSION: Radioactive seed localization left breast. No apparent complications. Electronically Signed   By: Gerome Sam III M.D.   On: 07/30/2022 09:30  [place] OUTSIDE FILMS BREAST  Result Date: 07/16/2022 This examination belongs to an outside facility and is stored here for comparison purposes only.  Contact the originating outside institution for any associated report or interpretation.  MM Outside Films Mammo  Result Date: 07/16/2022 This examination belongs to an outside facility and is stored here for comparison purposes only.  Contact the originating outside institution for any associated report or interpretation.  MM Outside Films Mammo  Result Date: 07/16/2022 This examination belongs to an outside facility and is stored here for comparison  purposes only.  Contact the originating outside institution for any associated report or interpretation.     IMPRESSION/PLAN: Left breast cancer, status postlumpectomy and sentinel lymph node biopsy, final pathology pending  It was a pleasure meeting the patient today. We discussed the risks, benefits, and side effects of radiotherapy. I recommend radiotherapy to the left breast (and regional nodes if appropriate)  to reduce her risk of locoregional recurrence by 2/3.  We discussed that radiation would take approximately 4-6 weeks to complete and that I would give the patient a few weeks to heal following surgery before starting treatment planning.  If chemotherapy were to be given, this would precede radiotherapy. We spoke about acute effects including skin irritation and fatigue as well as much less common late effects including internal organ injury or irritation. We spoke about the latest technology that is used to minimize the risk of late effects  for patients undergoing radiotherapy to the breast or chest wall. No guarantees of treatment were given. The patient is enthusiastic about proceeding with treatment. I look forward to participating in the patient's care.  I will await her referral back to me for postoperative follow-up and eventual CT simulation/treatment planning.  This encounter was provided by telemedicine platform; patient desired telemedicine during pandemic precautions.  MyChart video was used. The patient has given verbal consent for this type of encounter and has been advised to only accept a meeting of this type in a secure network environment. On date of service, in total, I spent 30 minutes on this encounter.   The attendants for this meeting include Lonie Peak  and Particia Nearing [place] During the encounter, Lonie Peak was located at [facility] Radiation Oncology Department.  Sara Browning was located at home.    __________________________________________   Lonie Peak, MD  This document serves as a record of services personally performed by Lonie Peak, MD. It was created on her behalf by Neena Rhymes, a trained medical scribe. The creation of this record is based on the scribe's personal observations and the provider's statements to them. This document has been checked and approved by the attending provider.

## 2022-08-01 NOTE — Transfer of Care (Signed)
Immediate Anesthesia Transfer of Care Note  Patient: Sara Browning  Procedure(s) Performed: LEFT BREAST LUMPECTOMY WITH RADIOACTIVE SEED AND SENTINEL LYMPH NODE BIOPSY (Left: Breast)  Patient Location: PACU  Anesthesia Type:General  Level of Consciousness: sedated  Airway & Oxygen Therapy: Patient Spontanous Breathing and Patient connected to face mask oxygen  Post-op Assessment: Report given to RN and Post -op Vital signs reviewed and stable  Post vital signs: Reviewed and stable  Last Vitals:  Vitals Value Taken Time  BP 144/85 08/01/22 1125  Temp 36.6 C 08/01/22 1125  Pulse 100 08/01/22 1127  Resp 12 08/01/22 1127  SpO2 100 % 08/01/22 1127  Vitals shown include unvalidated device data.  Last Pain:  Vitals:   08/01/22 0745  PainSc: 0-No pain         Complications: No notable events documented.

## 2022-08-01 NOTE — Interval H&P Note (Signed)
History and Physical Interval Note:  08/01/2022 9:27 AM  Sara Browning  has presented today for surgery, with the diagnosis of LEFT BREAST CANCER.  The various methods of treatment have been discussed with the patient and family. After consideration of risks, benefits and other options for treatment, the patient has consented to  Procedure(s): LEFT BREAST LUMPECTOMY WITH RADIOACTIVE SEED AND SENTINEL LYMPH NODE BIOPSY (Left) as a surgical intervention.  The patient's history has been reviewed, patient examined, no change in status, stable for surgery.  I have reviewed the patient's chart and labs.  Questions were answered to the patient's satisfaction.     Carrie Usery A Ottilie Wigglesworth

## 2022-08-01 NOTE — H&P (Signed)
Chief Complaint: New Consultation (Invasive ductal carcinoma grade 3)  History of Present Illness: Sara Browning is a 47 y.o. 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  Biopsy was done on the left breast 1:00 mass 6 cm from the nipple which showed grade 3 IDC, ER/PR strongly positive, HER2 1+ by IHC Ki-67 of 70%. Lymph node biopsy from the left axilla was negative for malignancy.  Mammogram showed a 2 cm mass left breast upper outer quadrant.  Review of Systems: A complete review of systems was obtained from the patient. I have reviewed this information and discussed as appropriate with the patient. See HPI as well for other ROS.    Medical History: Past Medical History:  Diagnosis Date  Liver disease   There is no problem list on file for this patient.  Past Surgical History:  Procedure Laterality Date  CESAREAN SECTION  2001, 2003    No Known Allergies  Current Outpatient Medications on File Prior to Visit  Medication Sig Dispense Refill  multivitamin tablet Take 1 tablet by mouth once daily   No current facility-administered medications on file prior to visit.   History reviewed. No pertinent family history.   Social History   Tobacco Use  Smoking Status Never  Smokeless Tobacco Never    Social History   Socioeconomic History  Marital status: Married  Tobacco Use  Smoking status: Never  Smokeless tobacco: Never  Substance and Sexual Activity  Alcohol use: Yes  Drug use: Never   Objective:   Vitals:  07/09/22 1556  BP: 114/74  Pulse: 78  Temp: 36.2 C (97.1 F)  SpO2: 99%  Weight: 67.6 kg (149 lb)  Height: 160 cm (5\' 3" )  PainSc: 0-No pain  PainLoc: Breast   Body mass index is 26.39 kg/m.  Physical Exam HENT:  Head: Normocephalic.  Musculoskeletal:  General: Normal  range of motion.  Skin: General: Skin is warm.  Neurological:  General: No focal deficit present.  Mental Status: She is alert.     Labs, Imaging and Diagnostic Testing: See above Mammogram reviewed personally Assessment and Plan:   Diagnoses and all orders for this visit:  Breast cancer, stage 2, left (CMS-HCC)   Palpable left breast cancer measuring about 2 cm in maximal diameter. She has seen medical oncology. I discussed breast conserving surgery versus mastectomy with reconstruction. Long-term survival, local regional recurrence and additional therapies reviewed. Complications of each reviewed. She is opted for left breast seed localized lumpectomy with left axillary sentinel lymph node mapping. Risk of bleeding, infection, lymphedema, shoulder stiffness, wound complications, drainage from the incision, cosmetic deformity, the need for the treatment standard procedures as well as reexcision reviewed today.   Hayden Rasmussen, MD

## 2022-08-01 NOTE — Anesthesia Postprocedure Evaluation (Signed)
Anesthesia Post Note  Patient: Sara Browning  Procedure(s) Performed: LEFT BREAST LUMPECTOMY WITH RADIOACTIVE SEED AND SENTINEL LYMPH NODE BIOPSY (Left: Breast)     Patient location during evaluation: PACU Anesthesia Type: General Level of consciousness: awake and alert and oriented Pain management: pain level controlled Vital Signs Assessment: post-procedure vital signs reviewed and stable Respiratory status: spontaneous breathing, nonlabored ventilation and respiratory function stable Cardiovascular status: blood pressure returned to baseline and stable Postop Assessment: no apparent nausea or vomiting Anesthetic complications: no   No notable events documented.  Last Vitals:  Vitals:   08/01/22 1145 08/01/22 1200  BP: 111/65 110/66  Pulse: 96 94  Resp: 13 12  Temp:    SpO2: 100% 100%    Last Pain:  Vitals:   08/01/22 1125  PainSc: 0-No pain                 Rosibel Giacobbe A.

## 2022-08-01 NOTE — Anesthesia Procedure Notes (Signed)
Anesthesia Regional Block: Pectoralis block   Pre-Anesthetic Checklist: , timeout performed,  Correct Patient, Correct Site, Correct Laterality,  Correct Procedure, Correct Position, site marked,  Risks and benefits discussed,  Surgical consent,  Pre-op evaluation,  At surgeon's request and post-op pain management  Laterality: Left  Prep: chloraprep       Needles:  Injection technique: Single-shot  Needle Type: Echogenic Stimulator Needle     Needle Length: 10cm  Needle Gauge: 21   Needle insertion depth: 6 cm   Additional Needles:   Procedures:,,,, ultrasound used (permanent image in chart),,    Narrative:  Start time: 08/01/2022 9:40 AM End time: 08/01/2022 9:45 AM Injection made incrementally with aspirations every 5 mL.  Performed by: Personally  Anesthesiologist: Mal Amabile, MD  Additional Notes: Timeout performed. Patient sedated. Relevant anatomy ID'd using [place]. Incremental 2-51ml injection of LA with frequent aspiration. Patient tolerated procedure well.

## 2022-08-01 NOTE — Anesthesia Procedure Notes (Signed)
Procedure Name: LMA Insertion Date/Time: 08/01/2022 10:09 AM  Performed by: Loleta Dinah Lupa, CRNAPre-anesthesia Checklist: Patient identified, Patient being monitored, Timeout performed, Emergency Drugs available and Suction available Patient Re-evaluated:Patient Re-evaluated prior to induction Oxygen Delivery Method: Circle system utilized Preoxygenation: Pre-oxygenation with 100% oxygen Induction Type: IV induction Ventilation: Mask ventilation without difficulty LMA: LMA inserted Tube type: Oral Number of attempts: 1 Placement Confirmation: positive ETCO2 and breath sounds checked- equal and bilateral Tube secured with: Tape Dental Injury: Teeth and Oropharynx as per pre-operative assessment

## 2022-08-01 NOTE — Discharge Instructions (Signed)
[facility] Office Phone Number [phone]  BREAST BIOPSY/ PARTIAL MASTECTOMY: POST OP INSTRUCTIONS  Always review your discharge instruction sheet given to you by the facility where your surgery was performed.  IF YOU HAVE DISABILITY OR FAMILY LEAVE FORMS, YOU MUST BRING THEM TO THE OFFICE FOR PROCESSING.  DO NOT GIVE THEM TO YOUR DOCTOR.  A prescription for pain medication may be given to you upon discharge.  Take your pain medication as prescribed, if needed.  If narcotic pain medicine is not needed, then you may take acetaminophen (Tylenol) or ibuprofen (Advil) as needed. Take your usually prescribed medications unless otherwise directed If you need a refill on your pain medication, please contact your pharmacy.  They will contact our office to request authorization.  Prescriptions will not be filled after 5pm or on week-ends. You should eat very light the first 24 hours after surgery, such as soup, crackers, pudding, etc.  Resume your normal diet the day after surgery. Most patients will experience some swelling and bruising in the breast.  Ice packs and a good support bra will help.  Swelling and bruising can take several days to resolve.  It is common to experience some constipation if taking pain medication after surgery.  Increasing fluid intake and taking a stool softener will usually help or prevent this problem from occurring.  A mild laxative (Milk of Magnesia or Miralax) should be taken according to package directions if there are no bowel movements after 48 hours. Unless discharge instructions indicate otherwise, you may remove your bandages 24-48 hours after surgery, and you may shower at that time.  You may have steri-strips (small skin tapes) in place directly over the incision.  These strips should be left on the skin for 7-10 days.  If your surgeon used skin glue on the incision, you may shower in 24 hours.  The glue will flake off over the next 2-3 weeks.  Any  sutures or staples will be removed at the office during your follow-up visit. ACTIVITIES:  You may resume regular daily activities (gradually increasing) beginning the next day.  Wearing a good support bra or sports bra minimizes pain and swelling.  You may have sexual intercourse when it is comfortable. You may drive when you no longer are taking prescription pain medication, you can comfortably wear a seatbelt, and you can safely maneuver your car and apply brakes. RETURN TO WORK:  ______________________________________________________________________________________ You should see your doctor in the office for a follow-up appointment approximately two weeks after your surgery.  Your doctor's nurse will typically make your follow-up appointment when she calls you with your pathology report.  Expect your pathology report 2-3 business days after your surgery.  You may call to check if you do not hear from us after three days. OTHER INSTRUCTIONS: _______________________________________________________________________________________________ _____________________________________________________________________________________________________________________________________ _____________________________________________________________________________________________________________________________________ _____________________________________________________________________________________________________________________________________  WHEN TO CALL YOUR DOCTOR: Fever over 101.0 Nausea and/or vomiting. Extreme swelling or bruising. Continued bleeding from incision. Increased pain, redness, or drainage from the incision.  The clinic staff is available to answer your questions during regular business hours.  Please don't hesitate to call and ask to speak to one of the nurses for clinical concerns.  If you have a medical emergency, go to the nearest emergency room or call 911.  A surgeon from [facility] is always on call at the hospital.  For further questions, please visit [email or web address]   

## 2022-08-01 NOTE — Op Note (Addendum)
Preoperative diagnosis: Stage I left breast cancer upper outer quadrant ER positive  Postoperative diagnosis: Same  Procedure: Left breast seed localized lumpectomy with  excision deep left axillary sentinel lymph node wit  mapping using mag trace  Surgeon: Harriette Bouillon, MD  Anesthesia: LMA with left pectoral block and 0.25% Marcaine plain  EBL: Minimal  Specimen: Left breast mass with seed and clip verified by Faxitron plus additional medial margin all oriented with ink in for a left axillary sentinel nodes  Drains: None  IV fluids: Per anesthesia record  Indications for procedure: The patient is a pleasant 47 year old female with stage I left breast cancer.  She was evaluated in the [facility].  She presents for breast conserving surgery after reviewing all of her surgical options.The procedure has been discussed with the patient. Alternatives to surgery have been discussed with the patient.  Risks of surgery include bleeding,  Infection,  Seroma formation, death,  and the need for further surgery.   The patient understands and wishes to proceed. Sentinel lymph node mapping and dissection has been discussed with the patient.  Risk of bleeding,  Infection,  Seroma formation,  Additional procedures,,  Shoulder weakness , lymphedema shoulder stiffness,  Nerve and blood vessel injury and reaction to the mapping dyes have been discussed.  Alternatives to surgery have been discussed with the patient.  The patient agrees to proceed.   Description of procedure: The patient was met in the holding area and questions were answered.  Of note a seed was placed as an outpatient in the left breast.  Films were available for review.  Left side was marked as correct site.  All questions were answered.  Left pectoral block placed by anesthesia.  She was then taken back to the operating room.  She was placed supine upon the OR table.  After induction of general esthesia, 2 cc of MAC trace were injected in the left  subareolar plexus and massaged.  She was then prepped and draped in sterile fashion a second timeout performed.  Proper patient, site and procedure verified.  Neoprobe used to identify the seed.  Films were available for review.  This was marked in the left breast upper outer quadrant.  A curvilinear incision was made over the signal.  Dissection was carried down all tissue and the seed and clip were excised with a grossly negative margin.  The image showed the medial margin to be close therefore I excised an additional medial margin.  All margins were oriented with ink.  The cavity was irrigated.  Made hemostatic with cautery.  Clips were placed.  Local anesthetic infiltrated.  The deep tissue layers were approximated with 3-0 Vicryl.  4 Monocryl was used to close the skin.  Dermabond applied.  Mag trace probe used and hotspot identified left axilla.  A transverse axillary incision was made in the left and dissection was carried down into the deep axillary contents.  There were 4 sentinel nodes identified and removed from the deep axillary contents.  These all had increased uptake of signal.  Background approached baseline.  No additional signal noted.  Hemostasis achieved.  Long thoracic nerve, thoracodorsal trunk and extra vein were preserved.  Arista was placed.  Deep tissue planes were closed with 3-0 Vicryl.  4 Monocryl was used to close the skin.  Dermabond applied.  Breast binder placed.  All counts found to be correct.

## 2022-08-02 ENCOUNTER — Encounter (HOSPITAL_COMMUNITY): Payer: Self-pay | Admitting: Surgery

## 2022-08-02 ENCOUNTER — Ambulatory Visit: Payer: Self-pay | Admitting: Genetic Counselor

## 2022-08-02 ENCOUNTER — Ambulatory Visit
Admission: RE | Admit: 2022-08-02 | Discharge: 2022-08-02 | Disposition: A | Discharge status: Home / Self Care | Payer: BC Managed Care – PPO | Source: Ambulatory Visit | Attending: Radiation Oncology | Admitting: Radiation Oncology

## 2022-08-02 ENCOUNTER — Encounter: Payer: Self-pay | Admitting: Genetic Counselor

## 2022-08-02 ENCOUNTER — Encounter: Payer: Self-pay | Admitting: Hematology and Oncology

## 2022-08-02 DIAGNOSIS — Z170 Estrogen receptor positive status [ER+]: Secondary | ICD-10-CM

## 2022-08-02 DIAGNOSIS — Z1379 Encounter for other screening for genetic and chromosomal anomalies: Secondary | ICD-10-CM

## 2022-08-02 DIAGNOSIS — C50412 Malignant neoplasm of upper-outer quadrant of left female breast: Secondary | ICD-10-CM | POA: Diagnosis not present

## 2022-08-02 NOTE — Progress Notes (Signed)
HPI:   Sara Browning was previously seen in the [facility] due to a personal and family history of cancer and concerns regarding a hereditary predisposition to cancer. Please refer to our prior cancer genetics clinic note for more information regarding our discussion, assessment and recommendations, at the time. Sara Browning recent genetic test results were disclosed to Sara Browning, as were recommendations warranted by these results. These results and recommendations are discussed in more detail below.  CANCER HISTORY:  Oncology History  Malignant neoplasm of upper-outer quadrant of left breast in female, estrogen receptor positive  07/09/2022 Initial Diagnosis   Malignant neoplasm of upper-outer quadrant of left breast in female, estrogen receptor positive (HCC)   07/09/2022 Cancer Staging   Staging form: Breast, AJCC 8th Edition - Clinical: Stage IIA (cT2, cN0, cM0, G3, ER+, PR+, HER2-) - Signed by [name], MD on 07/09/2022 Histologic grading system: 3 grade system    Genetic Testing   [facility] Multi-Cancer Panel+RNA was Negative. Of note, a variant of uncertain significance was detected in the HOXB13 gene (c.208C>T) and MUTYH gene (c.1547C>T). Report date is 07/24/2022.  The Multi-Cancer + RNA Panel offered by [facility] includes sequencing and/or deletion/duplication analysis of the following 70 genes:  AIP*, ALK, APC*, ATM*, AXIN2*, BAP1*, BARD1*, BLM*, BMPR1A*, BRCA1*, BRCA2*, BRIP1*, CDC73*, CDH1*, CDK4, CDKN1B*, CDKN2A, CHEK2*, CTNNA1*, DICER1*, EPCAM (del/dup only), EGFR, FH*, FLCN*, GREM1 (promoter dup only), HOXB13, KIT, LZTR1, MAX*, MBD4, MEN1*, MET, MITF, MLH1*, MSH2*, MSH3*, MSH6*, MUTYH*, NF1*, NF2*, NTHL1*, PALB2*, PDGFRA, PMS2*, POLD1*, POLE*, POT1*, PRKAR1A*, PTCH1*, PTEN*, RAD51C*, RAD51D*, RB1*, RET, SDHA* (sequencing only), SDHAF2*, SDHB*, SDHC*, SDHD*, SMAD4*, SMARCA4*, SMARCB1*, SMARCE1*, STK11*, SUFU*, TMEM127*, TP53*, TSC1*, TSC2*, VHL*. RNA analysis is  performed for * genes.     FAMILY HISTORY:  We obtained a detailed, 4-generation family history.  Significant diagnoses are listed below:      Family History  Problem Relation Age of Onset   Liver cancer Maternal Aunt     Breast cancer Maternal Grandmother     Hypertension Paternal Grandmother     Diabetes Paternal Grandfather           Sara Browning maternal grandmother was diagnosed with breast cancer at an unknown age, Sara Browning died due to metastatic breast cancer. Sara Browning maternal aunt was diagnosed with liver cancer at an unknown age, Sara Browning is deceased. Sara Browning is unaware of previous family history of genetic testing for hereditary cancer risks. There is no reported Ashkenazi Jewish ancestry.  GENETIC TEST RESULTS:  The [facility] Multi-Cancer Panel found no pathogenic mutations.   The Multi-Cancer + RNA Panel offered by [facility] includes sequencing and/or deletion/duplication analysis of the following 70 genes:  AIP*, ALK, APC*, ATM*, AXIN2*, BAP1*, BARD1*, BLM*, BMPR1A*, BRCA1*, BRCA2*, BRIP1*, CDC73*, CDH1*, CDK4, CDKN1B*, CDKN2A, CHEK2*, CTNNA1*, DICER1*, EPCAM (del/dup only), EGFR, FH*, FLCN*, GREM1 (promoter dup only), HOXB13, KIT, LZTR1, MAX*, MBD4, MEN1*, MET, MITF, MLH1*, MSH2*, MSH3*, MSH6*, MUTYH*, NF1*, NF2*, NTHL1*, PALB2*, PDGFRA, PMS2*, POLD1*, POLE*, POT1*, PRKAR1A*, PTCH1*, PTEN*, RAD51C*, RAD51D*, RB1*, RET, SDHA* (sequencing only), SDHAF2*, SDHB*, SDHC*, SDHD*, SMAD4*, SMARCA4*, SMARCB1*, SMARCE1*, STK11*, SUFU*, TMEM127*, TP53*, TSC1*, TSC2*, VHL*. RNA analysis is performed for * genes.  The test report has been scanned into EPIC and is located under the Molecular Pathology section of the Results Review tab.  A portion of the result report is included below for reference. Genetic testing reported out on 07/24/2022.      Genetic testing identified a variant of uncertain significance (VUS) in the HOXB13  gene and the MUTYH gene. At this time, it is unknown if the variants are  associated with an increased risk for cancer or if they are benign, but most uncertain variants are reclassified to benign. It should not be used to make medical management decisions. With time, we suspect the laboratory will determine the significance of the variants, if any. If the laboratory reclassifies the variants, we will attempt to contact Sara Browning to discuss it further.   Even though a pathogenic variant was not identified, possible explanations for the cancer in the family may include: There may be no hereditary risk for cancer in the family. The cancers in Sara Browning and/or Sara Browning family may be due to other genetic or environmental factors. There may be a gene mutation in one of these genes that current testing methods cannot detect, but that chance is small. There could be another gene that has not yet been discovered, or that we have not yet tested, that is responsible for the cancer diagnoses in the family.  It is also possible there is a hereditary cause for the cancer in the family that Sara Browning did not inherit.  Therefore, it is important to remain in touch with cancer genetics in the future so that we can continue to offer Sara Browning the most up to date genetic testing.   ADDITIONAL GENETIC TESTING:  We discussed with Sara Browning that Sara Browning genetic testing was fairly extensive.  If there are genes identified to increase cancer risk that can be analyzed in the future, we would be happy to discuss and coordinate this testing at that time.    CANCER SCREENING RECOMMENDATIONS:  Sara Browning test result is considered negative (normal).  This means that we have not identified a hereditary cause for Sara Browning personal and family history of cancer at this time.   An individual's cancer risk and medical management are not determined by genetic test results alone. Overall cancer risk assessment incorporates additional factors, including personal medical history, family history, and any  available genetic information that may result in a personalized plan for cancer prevention and surveillance. Therefore, it is recommended Sara Browning continue to follow the cancer management and screening guidelines provided by Sara Browning oncology and primary healthcare provider.  RECOMMENDATIONS FOR FAMILY MEMBERS:   Since Sara Browning did not inherit a mutation in a cancer predisposition gene included on this panel, Sara Browning children could not have inherited a mutation from Sara Browning in one of these genes. Individuals in this family might be at some increased risk of developing cancer, over the general population risk, due to the family history of cancer. We recommend women in this family have a yearly mammogram beginning at age 59, or 21 years younger than the earliest onset of cancer, an annual clinical breast exam, and perform monthly breast self-exams.  FOLLOW-UP:  Cancer genetics is a rapidly advancing field and it is possible that new genetic tests will be appropriate for Sara Browning and/or Sara Browning family members in the future. We encouraged Sara Browning to remain in contact with cancer genetics on an annual basis so we can update Sara Browning personal and family histories and let Sara Browning know of advances in cancer genetics that may benefit this family.   Our contact number was provided. Sara Browning questions were answered to Sara Browning satisfaction, and Sara Browning knows Sara Browning is welcome to call us at anytime with additional questions or concerns.   Lalla Brothers, MS, [facility] Genetic Counselor [email or web address] (P) [phone]

## 2022-08-06 LAB — SURGICAL PATHOLOGY

## 2022-08-07 ENCOUNTER — Encounter: Payer: Self-pay | Admitting: Surgery

## 2022-08-14 ENCOUNTER — Telehealth: Payer: Self-pay | Admitting: *Deleted

## 2022-08-14 ENCOUNTER — Encounter: Payer: Self-pay | Admitting: *Deleted

## 2022-08-14 NOTE — Telephone Encounter (Signed)
Ordered oncotype per Dr. Al Pimple. Requisition sent to pathology.

## 2022-08-16 ENCOUNTER — Telehealth: Payer: Self-pay | Admitting: *Deleted

## 2022-08-16 NOTE — Telephone Encounter (Signed)
Message from "Sara Browning [place], [phone] (home) requesting return call about all forms.  FMLA has been approved yet I brought three forms in."    Voicemail left that this nurse will re-fax if needed and the following information about completion of three forms received.   [employer] Claim signed and returned via fax [phone]) on 08/06/2022. [employer] signed 08/09/2022 returned via fax [phone]) upon this nurse receipt on 08/12/2022. [employer] Leave and Disability signed 08/08/2020 was also returned via fax [phone]) on 08/12/2022.   Awaiting return call for further instructions if needed.

## 2022-08-22 ENCOUNTER — Ambulatory Visit: Payer: BC Managed Care – PPO | Admitting: Rehabilitation

## 2022-09-02 ENCOUNTER — Encounter: Payer: Self-pay | Admitting: *Deleted

## 2022-09-02 ENCOUNTER — Telehealth: Payer: Self-pay | Admitting: *Deleted

## 2022-09-02 NOTE — Telephone Encounter (Signed)
Reached out Radiographer, therapeutic for an update on oncotype results since Expected release date was 5/10. I was informed they are still waiting on insurance PA and exact sciences will follow back up with her insurance.

## 2022-09-04 ENCOUNTER — Ambulatory Visit: Payer: BC Managed Care – PPO | Attending: Hematology and Oncology | Admitting: Rehabilitation

## 2022-09-04 ENCOUNTER — Encounter: Payer: Self-pay | Admitting: Rehabilitation

## 2022-09-04 DIAGNOSIS — Z170 Estrogen receptor positive status [ER+]: Secondary | ICD-10-CM | POA: Diagnosis not present

## 2022-09-04 DIAGNOSIS — Z483 Aftercare following surgery for neoplasm: Secondary | ICD-10-CM | POA: Insufficient documentation

## 2022-09-04 DIAGNOSIS — Z9189 Other specified personal risk factors, not elsewhere classified: Secondary | ICD-10-CM | POA: Diagnosis not present

## 2022-09-04 DIAGNOSIS — C50412 Malignant neoplasm of upper-outer quadrant of left female breast: Secondary | ICD-10-CM | POA: Diagnosis not present

## 2022-09-04 NOTE — Therapy (Signed)
OUTPATIENT PHYSICAL THERAPY BREAST CANCER POST OP FOLLOW UP   Patient Name: Sara Browning MRN: [id number] DOB:1975/12/06, 47 y.o., female Today's Date: 09/04/2022  END OF SESSION:  PT End of Session - 09/04/22 1558     Visit Number 2    Number of Visits 2    Date for PT Re-Evaluation 09/10/22    PT Start Time 1400    PT Stop Time 1422    PT Time Calculation (min) 22 min    Activity Tolerance Patient tolerated treatment well    Behavior During Therapy [facility] for tasks assessed/performed             Past Medical History:  Diagnosis Date   Cancer (HCC) 2024   Left Breat Cancer   Hepatitis    Hep B Carrier   Hepatitis 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)    Pneumonia    Hx   Urticaria    Past Surgical History:  Procedure Laterality Date   BREAST BIOPSY Left 07/30/2022   [place] LT RADIOACTIVE SEED LOC 07/30/2022 [facility] MAMMOGRAP[facility]   BREAST LUMPECTOMY WITH RADIOACTIVE SEED AND SENTINEL LYMPH NODE BIOPSY Left 08/01/2022   Procedure: LEFT BREAST LUMPECTOMY WITH RADIOACTIVE SEED AND SENTINEL LYMPH NODE BIOPSY;  Surgeon: Harriette Bouillon, MD;  Location: [facility] OR;  Service: General;  Laterality: Left;   CESAREAN SECTION     twice   Patient Active Problem List   Diagnosis Date Noted   Genetic testing 07/25/2022   Malignant neoplasm of upper-outer quadrant of left breast in female, estrogen receptor positive (HCC) 07/09/2022    PCP: [place] NP   REFERRING PROVIDER: [name], MD   REFERRING DIAG: left breast cancer   THERAPY DIAG:  Malignant neoplasm of upper-outer quadrant of left breast in female, estrogen receptor positive (HCC)  Aftercare following surgery for neoplasm  At risk for lymphedema  Rationale for Evaluation and Treatment: Rehabilitation  ONSET DATE: 07/22/22  SUBJECTIVE:                                                                                                                                                                                           SUBJECTIVE  STATEMENT: He told me to wait another week or two before going back to workout.  Everyday I feel better.    PERTINENT HISTORY:  left lumpectomy and SLNB on 08/01/22 with 9 negative nodes removed due to grade 3 IDC ER/PR positive Ki67 of 70%.  Will probably do radiation. Hep B carrier   PATIENT GOALS:  Reassess how my recovery is going related to arm function, pain, and swelling.  PAIN:  Are you having pain? Yes: NPRS  scale: 1/10 Pain location: Left axilla Pain description: toothache Aggravating factors: none Relieving factors: none  PRECAUTIONS: Recent Surgery, left UE Lymphedema risk  ACTIVITY LEVEL / LEISURE: Has not returned to exercising - running and gym machines.     OBJECTIVE:   PATIENT SURVEYS:  QUICK DASH: 29%  OBSERVATIONS: Not wearing compression bra anymore. Incisions well healed.  No cording.   POSTURE:  WNL  LYMPHEDEMA ASSESSMENT:   UPPER EXTREMITY AROM/PROM:   A/PROM RIGHT   eval    Shoulder extension 80  Shoulder flexion 173  Shoulder abduction 175  Shoulder internal rotation    Shoulder external rotation 95                          (Blank rows = not tested)   A/PROM LEFT   eval 09/04/22  Shoulder extension 58 60  Shoulder flexion 160 160  Shoulder abduction 163 170  Shoulder internal rotation     Shoulder external rotation 92 92                          (Blank rows = not tested)   UPPER EXTREMITY STRENGTH: 5/5 except 4/5 Rt ER no pain for any    LYMPHEDEMA ASSESSMENTS:    LANDMARK RIGHT   eval 09/04/22  10 cm proximal to olecranon process 30.6 30.6  Olecranon process 24.5 24.5  10 cm proximal to ulnar styloid process 21.1 21.1  Just proximal to ulnar styloid process 14.8 15  Across hand at thumb web space 18.7 18.7  At base of 2nd digit 6.2 6.2  (Blank rows = not tested)   LANDMARK LEFT   eval 09/04/22  10 cm proximal to olecranon process 30.3 30.3  Olecranon process 24 24  10  cm proximal to ulnar styloid process 20.3 20.3  Just  proximal to ulnar styloid process 14.5 14.5  Across hand at thumb web space 19.2 19.2  At base of 2nd digit 6.3 6.3  (Blank rows = not tested)  PATIENT EDUCATION:  Education details: per below Person educated: Patient Education method: Programmer, multimedia, Demonstration, Tactile cues, Verbal cues, and Handouts Education comprehension: verbalized understanding  HOME EXERCISE PROGRAM: Reviewed previously given post op HEP.   ASSESSMENT:  CLINICAL IMPRESSION: Pt is doing really well post operatively.  She did notice some cording and edema originally but reports it seems to be gone now.  It was not visible or palpable today.    Pt will benefit from skilled therapeutic intervention to improve on the following deficits: Decreased knowledge of precautions, impaired UE functional use, pain, decreased ROM, postural dysfunction.   PT treatment/interventions: ADL/Self care home management, Therapeutic exercises, Patient/Family education, Self Care, Manual therapy, and Re-evaluation   GOALS: Goals reviewed with patient? Yes  LONG TERM GOALS:  (STG=LTG)  GOALS Name Target Date  Goal status  1 Pt will demonstrate she has regained full shoulder ROM and function post operatively compared to baselines.  Baseline: 09/04/22 MET                    PLAN:  PT FREQUENCY/DURATION: SOZO only  PLAN FOR NEXT SESSION: SOZO only   [facility]  [address], [address]  [place] [address]  [phone]  After Breast Cancer Class It is recommended you attend the ABC class to be educated on lymphedema risk reduction. This class is free of charge and lasts for 1 hour. It  is a 1-time class. You will need to download the TEAMS app either on your phone or computer. We will send you a link the night before or the morning of the class. You should be able to click on that link to join the class. This is not a confidential class. You don't have to turn your camera on, but other  participants may be able to see your email address.  Scar massage You can begin gentle scar massage to you incision sites. Gently place one hand on the incision and move the skin (without sliding on the skin) in various directions. Do this for a few minutes and then you can gently massage either coconut oil or vitamin E cream into the scars.  Compression garment You should continue wearing your compression bra until you feel like you no longer have swelling.  Home exercise Program Continue doing the exercises you were given until you feel like you can do them without feeling any tightness at the end.  You would get a sleeve at [facility]" here in [place] if you want to get one for exercising (prescription needed)   Walking Program Studies show that 30 minutes of walking per day (fast enough to elevate your heart rate) can significantly reduce the risk of a cancer recurrence. If you can't walk due to other medical reasons, we encourage you to find another activity you could do (like a stationary bike or water exercise).  Posture After breast cancer surgery, people frequently sit with rounded shoulders posture because it puts their incisions on slack and feels better. If you sit like this and scar tissue forms in that position, you can become very tight and have pain sitting or standing with good posture. Try to be aware of your posture and sit and stand up tall to heal properly.  Follow up PT: It is recommended you return every 3 months for the first 3 years following surgery to be assessed on the SOZO machine for an L-Dex score. This helps prevent clinically significant lymphedema in 95% of patients. These follow up screens are 10 minute appointments that you are not billed for.  Idamae Lusher, PT 09/04/2022, 3:59 PM

## 2022-09-05 ENCOUNTER — Encounter: Payer: Self-pay | Admitting: *Deleted

## 2022-09-05 ENCOUNTER — Telehealth: Payer: Self-pay | Admitting: *Deleted

## 2022-09-05 NOTE — Telephone Encounter (Signed)
Received oncotype results of 24/10%. She will see Dr. Al Pimple 5/23 to discuss.

## 2022-09-09 ENCOUNTER — Encounter: Payer: Self-pay | Admitting: Hematology and Oncology

## 2022-09-12 ENCOUNTER — Inpatient Hospital Stay: Payer: BC Managed Care – PPO | Attending: Hematology and Oncology | Admitting: Hematology and Oncology

## 2022-09-12 VITALS — BP 129/57 | HR 86 | Temp 98.1°F | Resp 16 | Wt 156.4 lb

## 2022-09-12 DIAGNOSIS — Z803 Family history of malignant neoplasm of breast: Secondary | ICD-10-CM | POA: Diagnosis not present

## 2022-09-12 DIAGNOSIS — Z800 Family history of malignant neoplasm of digestive organs: Secondary | ICD-10-CM | POA: Diagnosis not present

## 2022-09-12 DIAGNOSIS — B181 Chronic viral hepatitis B without delta-agent: Secondary | ICD-10-CM | POA: Insufficient documentation

## 2022-09-12 DIAGNOSIS — C50412 Malignant neoplasm of upper-outer quadrant of left female breast: Secondary | ICD-10-CM | POA: Diagnosis not present

## 2022-09-12 DIAGNOSIS — Z170 Estrogen receptor positive status [ER+]: Secondary | ICD-10-CM | POA: Diagnosis not present

## 2022-09-13 ENCOUNTER — Telehealth: Payer: Self-pay | Admitting: *Deleted

## 2022-09-13 NOTE — Telephone Encounter (Signed)
Trial:[id number]: A Phase III Adjuvant Trial Evaluating the Addition of Adjuvant Chemotherapy to Ovarian Function Suppression plus Endocrine Therapy in Premenopausal Patients with pN0-1, ER-Positive/HER2-Negative Breast Cancer and an Oncotype Recurrence Score ? 25 (OFSET)   Patient Sara Browning was identified by Dr. Al Pimple as a potential candidate for the above listed study.  This Clinical Research Nurse called MERL ANCELET, [phone], to discuss participation in the above listed research study.  Left voicemail message for patient to please return call at her convenience to discuss and informed patient that this nurse can email her some information and is also available to meet with her in person next week.  Domenica Reamer, BSN, RN, [employer] Research Nurse II [phone] 09/13/2022 4:27 PM

## 2022-09-13 NOTE — Assessment & Plan Note (Addendum)
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  I agree with the considering breast MRI for accurate sizing.  Given strong ER/PR positivity despite high-grade and high proliferation index, if she is indeed node-negative is appropriate to continue or proceed with surgery upfront.  She is status post lumpectomy, final pathology showed a grade three 3.1 cm IDC, ER/PR positive HER2 negative, negative margins, negative lymph nodes.  Oncotype DX of 24.  She will be an excellent candidate for OFSET trial.  She is interested in participating because she would like to try and avoid chemo if possible but she understands that if she were to be randomized to the chemo arm, she will still get chemotherapy.  Outside the trial in women who are premenopausal and with an Oncotype DX of 16 or greater, we will offer chemotherapy.  We have discussed about docetaxel and cyclophosphamide every 21 days for 4 cycles.  I have discussed the details of chemotherapy and adverse effects of chemotherapy including but not limited to fatigue, nausea, vomiting, diarrhea, increased risk of infections, neuropathy etc. I have already called the research team and asked them to follow-up on this.  She should consider proceeding with trial enrollment as soon as possible and once randomized, we will see if she will need chemotherapy or ovarian suppression with antiestrogen therapy.

## 2022-09-13 NOTE — Progress Notes (Signed)
[facility] CONSULT NOTE  Patient Care Team: [name], NP as PCP - General (Nurse Practitioner) Marcelyn Bruins, MD as Consulting Physician (Allergy) Pershing Proud, RN as Oncology Nurse Navigator Donnelly Angelica, RN as Oncology Nurse Navigator [name], MD as Consulting Physician (Hematology and Oncology)  CHIEF COMPLAINTS/PURPOSE OF CONSULTATION:  Newly diagnosed breast cancer  HISTORY OF PRESENTING ILLNESS:  Sara Browning 47 y.o. 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  She went to her primary doctor had further evaluation with diagnostic mammogram which confirmed left breast mass.  Biopsy was done on the left breast 1:00 mass 6 cm from the nipple which showed grade 3 IDC, ER/PR strongly positive, HER2 1+ by IHC Ki-67 of 70%.  Lymph node biopsy from the left axilla was negative for malignancy.  They recommended an MRI.  Patient lives in [place] but did not want to continue care in [place] hence referrals were made to medical oncology and surgical oncology in [place].  She arrived to the appointment today with her husband.  She currently works as a Teacher, early years/pre for [employer].  She was never deployed even while in [employer] but worked with several solvents.  She has 3 children, age at first birth of 33.  No history of breast-feeding.  No history of prolonged use of birth control.  She is adopted hence does not know her family history on her maternal side but no evidence of breast cancer or other cancers from paternal side.  She is originally from [place] and tells me that cancers are not really common back there. 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  She does not follow-up with ID locally.    Since her last visit here, she had left breast lumpectomy which showed a 3.1 cm invasive  ductal carcinoma in maximal dimension, grade 3, negative margins, ER +80% strong staining PR 90% positive strong staining HER2 negative 1+, Ki-67 of 70% no evidence of lymph node involvement.  Oncotype DX of 24, distant recurrence risk at 9 years of 10% with antiestrogen therapy alone.  She is here for follow-up after surgery.  She is doing well today.  She is recovering well.  She denies any new health complaints.  MEDICAL HISTORY:  Past Medical History:  Diagnosis Date   Cancer (HCC) 2024   Left Breat Cancer   Hepatitis    Hep B Carrier   Hepatitis 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)    Pneumonia    Hx   Urticaria     SURGICAL HISTORY: Past Surgical History:  Procedure Laterality Date   BREAST BIOPSY Left 07/30/2022   [place] LT RADIOACTIVE SEED LOC 07/30/2022 [facility] MAMMOGR[facility]   BREAST LUMPECTOMY WITH RADIOACTIVE SEED AND SENTINEL LYMPH NODE BIOPSY Left 08/01/2022   Procedure: LEFT BREAST LUMPECTOMY WITH RADIOACTIVE SEED AND SENTINEL LYMPH NODE BIOPSY;  Surgeon: Harriette Bouillon, MD;  Location: [facility] OR;  Service: General;  Laterality: Left;   CESAREAN SECTION     twice    SOCIAL HISTORY: Social History   Socioeconomic History   Marital status: Married    Spouse name: Not on file   Number of children: Not on file   Years of education: Not on file   Highest education level: Not on file  Occupational History   Not on file  Tobacco Use   Smoking  status: Never   Smokeless tobacco: Never   Tobacco comments:    Drinks seldom  Vaping Use   Vaping Use: Never used  Substance and Sexual Activity   Alcohol use: Not Currently    Comment: occasionally   Drug use: Never   Sexual activity: Yes  Other Topics Concern   Not on file  Social History Narrative   Not on file   Social Determinants of Health   Financial Resource Strain: Not on file  Food Insecurity: No Food Insecurity (07/19/2022)   Hunger Vital Sign    Worried About Running Out of Food in the Last Year: Never true    Ran Out of Food in the  Last Year: Never true  Transportation Needs: No Transportation Needs (07/19/2022)   PRAPARE - Administrator, Civil Service (Medical): No    Lack of Transportation (Non-Medical): No  Physical Activity: Not on file  Stress: Not on file  Social Connections: Not on file  Intimate Partner Violence: Not on file    FAMILY HISTORY: Family History  Problem Relation Age of Onset   Liver cancer Maternal Aunt    Breast cancer Maternal Grandmother    Hypertension Paternal Grandmother    Diabetes Paternal Grandfather     ALLERGIES:  has No Known Allergies.  MEDICATIONS:  Current Outpatient Medications  Medication Sig Dispense Refill   BETA GLUCAN PO Take 2 tablets by mouth daily.     ibuprofen (ADVIL) 200 MG tablet Take 400-600 mg by mouth every 6 (six) hours as needed for moderate pain.     ibuprofen (ADVIL) 800 MG tablet Take 1 tablet (800 mg total) by mouth every 8 (eight) hours as needed. 30 tablet 0   loratadine (CLARITIN) 10 MG tablet Take 10 mg by mouth daily as needed for allergies.     Melatonin 5 MG CHEW Chew 5 mg by mouth at bedtime as needed (sleep).     Multiple Vitamin (MULTIVITAMIN) tablet Take 1 tablet by mouth daily.     OVER THE COUNTER MEDICATION Take 2 capsules by mouth daily. Graviola supplement     OVER THE COUNTER MEDICATION Take 1 Scoop by mouth daily. prolean greens supplement powder     oxyCODONE (OXY IR/ROXICODONE) 5 MG immediate release tablet Take 1 tablet (5 mg total) by mouth every 6 (six) hours as needed for severe pain. 15 tablet 0   No current facility-administered medications for this visit.    REVIEW OF SYSTEMS:   Constitutional: Denies fevers, chills or abnormal night sweats Eyes: Denies blurriness of vision, double vision or watery eyes Ears, nose, mouth, throat, and face: Denies mucositis or sore throat Respiratory: Denies cough, dyspnea or wheezes Cardiovascular: Denies palpitation, chest discomfort or lower extremity  swelling Gastrointestinal:  Denies nausea, heartburn or change in bowel habits Skin: Denies abnormal skin rashes Lymphatics: Denies new lymphadenopathy or easy bruising Neurological:Denies numbness, tingling or new weaknesses Behavioral/Psych: Mood is stable, no new changes  Breast: Palpable breast lump in the left breast All other systems were reviewed with the patient and are negative.  PHYSICAL EXAMINATION: ECOG PERFORMANCE STATUS: 0 - Asymptomatic  Vitals:   09/12/22 1602  BP: (!) 129/57  Pulse: 86  Resp: 16  Temp: 98.1 F (36.7 C)  SpO2: 100%   Filed Weights   09/12/22 1602  Weight: 156 lb 6.4 oz (70.9 kg)    GENERAL:alert, no distress and comfortable Rest of the physical exam deferred in lieu of counseling  LABORATORY DATA:  I  have reviewed the data as listed Lab Results  Component Value Date   WBC 7.0 07/31/2022   HGB 13.4 07/31/2022   HCT 39.8 07/31/2022   MCV 85.6 07/31/2022   PLT 253 07/31/2022   Lab Results  Component Value Date   NA 133 (L) 07/31/2022   K 4.1 07/31/2022   CL 102 07/31/2022   CO2 27 07/31/2022    RADIOGRAPHIC STUDIES: I have personally reviewed the radiological reports and agreed with the findings in the report.  ASSESSMENT AND PLAN:  Malignant neoplasm of upper-outer quadrant of left breast in female, estrogen receptor positive (HCC) This is a very pleasant 47 year old premenopausal female patient with no significant family history of breast cancer with newly diagnosed left breast upper outer quadrant T2 N0 M0 grade 3 IDC, ER/PR strongly positive HER2 1+ by IHC Ki-67 of 70% referred to medical oncology and surgical oncology for recommendations.  I agree with the considering breast MRI for accurate sizing.  Given strong ER/PR positivity despite high-grade and high proliferation index, if she is indeed node-negative is appropriate to continue or proceed with surgery upfront.  She is status post lumpectomy, final pathology showed a grade  three 3.1 cm IDC, ER/PR positive HER2 negative, negative margins, negative lymph nodes.  Oncotype DX of 24.  She will be an excellent candidate for OFSET trial.  She is interested in participating because she would like to try and avoid chemo if possible but she understands that if she were to be randomized to the chemo arm, she will still get chemotherapy.  Outside the trial in women who are premenopausal and with an Oncotype DX of 16 or greater, we will offer chemotherapy.  We have discussed about docetaxel and cyclophosphamide every 21 days for 4 cycles.  I have discussed the details of chemotherapy and adverse effects of chemotherapy including but not limited to fatigue, nausea, vomiting, diarrhea, increased risk of infections, neuropathy etc. I have already called the research team and asked them to follow-up on this.  She should consider proceeding with trial enrollment as soon as possible and once randomized, we will see if she will need chemotherapy or ovarian suppression with antiestrogen therapy.    Total time spent: 40 minutes including history, physical exam, review of records, counseling and coordination of care All questions were answered. The patient knows to call the clinic with any problems, questions or concerns.    [name], MD 09/13/22

## 2022-09-17 ENCOUNTER — Encounter: Payer: Self-pay | Admitting: Hematology and Oncology

## 2022-09-17 ENCOUNTER — Telehealth: Payer: Self-pay | Admitting: *Deleted

## 2022-09-17 NOTE — Telephone Encounter (Signed)
[id number]: A Phase III Adjuvant Trial Evaluating the Addition of Adjuvant Chemotherapy to Ovarian Function Suppression plus Endocrine Therapy in Premenopausal Patients with pN0-1, ER-Positive/HER2-Negative Breast Cancer and an Oncotype Recurrence Score ? 25 (OFSET)  Patient returned call regarding the above study. Briefly described the study by phone and emailed patient a copy of the informed consent and HIPAA forms to patient for her review. Informed patient that this research nurse is working on confirming her eligibility with the study and will let her know as soon as I hear back them. She verbalized understanding.  Domenica Reamer, BSN, RN, [employer] Research Nurse II [phone] 09/17/2022 1:27 PM

## 2022-09-18 ENCOUNTER — Telehealth: Payer: Self-pay | Admitting: *Deleted

## 2022-09-18 ENCOUNTER — Other Ambulatory Visit: Payer: Self-pay | Admitting: *Deleted

## 2022-09-18 DIAGNOSIS — Z170 Estrogen receptor positive status [ER+]: Secondary | ICD-10-CM

## 2022-09-18 DIAGNOSIS — B181 Chronic viral hepatitis B without delta-agent: Secondary | ICD-10-CM

## 2022-09-18 NOTE — Telephone Encounter (Signed)
This RN spoke with pt post My Chart communication- scheduled pt for MD appt this Friday to discuss treatment.  Pt is scheduled for ID on 6/4.  No further needs at this time.

## 2022-09-20 ENCOUNTER — Inpatient Hospital Stay (HOSPITAL_BASED_OUTPATIENT_CLINIC_OR_DEPARTMENT_OTHER): Payer: BC Managed Care – PPO | Admitting: Hematology and Oncology

## 2022-09-20 ENCOUNTER — Encounter: Payer: Self-pay | Admitting: *Deleted

## 2022-09-20 VITALS — BP 121/56 | HR 82 | Temp 98.1°F | Resp 16 | Wt 153.1 lb

## 2022-09-20 DIAGNOSIS — B181 Chronic viral hepatitis B without delta-agent: Secondary | ICD-10-CM | POA: Diagnosis not present

## 2022-09-20 DIAGNOSIS — Z800 Family history of malignant neoplasm of digestive organs: Secondary | ICD-10-CM | POA: Diagnosis not present

## 2022-09-20 DIAGNOSIS — Z170 Estrogen receptor positive status [ER+]: Secondary | ICD-10-CM | POA: Diagnosis not present

## 2022-09-20 DIAGNOSIS — C50412 Malignant neoplasm of upper-outer quadrant of left female breast: Secondary | ICD-10-CM

## 2022-09-20 DIAGNOSIS — Z803 Family history of malignant neoplasm of breast: Secondary | ICD-10-CM | POA: Diagnosis not present

## 2022-09-20 NOTE — Assessment & Plan Note (Addendum)
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  I agree with the considering breast MRI for accurate sizing.  Given strong ER/PR positivity despite high-grade and high proliferation index, if she is indeed node-negative is appropriate to continue or proceed with surgery upfront.  She is status post lumpectomy, final pathology showed a grade three 3.1 cm IDC, ER/PR positive HER2 negative, negative margins, negative lymph nodes.  Oncotype DX of 24.  She will be an excellent candidate for OFSET trial.  She is interested in participating because she would like to try and avoid chemo if possible but she understands that if she were to be randomized to the chemo arm, she will still get chemotherapy.  Outside the trial in women who are premenopausal and with an Oncotype DX of 16 or greater, we will offer chemotherapy.  We have discussed about docetaxel and cyclophosphamide every 21 days for 4 cycles.  I have discussed the details of chemotherapy and adverse effects of chemotherapy including but not limited to fatigue, nausea, vomiting, diarrhea, increased risk of infections, neuropathy etc. She once again is here to discuss about trial, she apparently had some concerns after reading through the consent. We went over the details of the trial again. We discussed that she will get [facility] outside the trial which is chemotherapy with OFS and AI. I once again went over the chemotherapy details today with her and her sister. I used teach back technique to make sure she understands. At this time, we are awaiting ID recommendations. She is a hepatitis B carrier. She will be seeing them on Tuesday. I will try to coordinate with Dr Earlene Plater from ID I will plan to call  her on Wednesday. We also gave her details on how to order the cold cap in case she is interested to proceed.

## 2022-09-20 NOTE — Research (Signed)
Trial Name:  [id number]: A Phase III Adjuvant Trial Evaluating the Addition of Adjuvant Chemotherapy to Ovarian Function Suppression plus Endocrine Therapy in Premenopausal Patients with pN0-1, ER-Positive/HER2-Negative Breast Cancer and an Oncotype Recurrence Score ? 25 (OFSET)   Patient Sara Browning was identified by Dr. Al Pimple as a potential candidate for the above listed study.  This Clinical Research Nurse met with Sara Browning, Sara Browning on 09/20/22 in a manner and location that ensures patient privacy to discuss participation in the above listed research study.  Patient is Unaccompanied.  Patient was previously provided with informed consent documents.  Patient confirmed they have read the informed consent documents.  As outlined in the informed consent form, this Nurse and Astra D Govan discussed the purpose of the research study, the investigational nature of the study, study procedures and requirements for study participation, potential risks and benefits of study participation, as well as alternatives to participation.  This study is not blinded or double-blinded. The patient understands participation is voluntary and they may withdraw from study participation at any time.  Each study arm was reviewed, and randomization discussed.  This study does not involve an investigational drug or device. This study does not involve a placebo. Patient understands enrollment is pending full eligibility review.   Confidentiality and how the patient's information will be used as part of study participation were discussed.  Patient was informed there is not reimbursement provided for their time and effort spent on trial participation.  The patient is encouraged to discuss research study participation with their insurance provider to determine what costs they may incur as part of study participation, including research related injury.    All questions were answered to patient's satisfaction.  The  informed consent and separate HIPAA Authorization was reviewed page by page.  The patient's mental and emotional status is appropriate to provide informed consent, and the patient verbalizes an understanding of study participation.  Patient has agreed to participate in the above listed research study and has voluntarily signed the informed consent protocol version date 11/15/21, [place] Active 06/20/22  and separate HIPAA Authorization, version [place] IRB approved 05/16/22  on 09/20/22 at 12:51PM.  Patient circled "Yes" to take part in the quality of life study, optional sample collection and to be contacted for future research. The patient was provided with a copy of the signed informed consent form and separate HIPAA Authorization for their reference.  No study specific procedures were obtained prior to the signing of the informed consent document.  Approximately 45 minutes were spent with the patient reviewing the informed consent documents.  Patient was not requested to complete a Release of Information form.   Patient understands that randomization to this study is pending her hepatitis B workup at [facility]. If the ID evaluation does not show any evidence of chronic hepatitis B infection, then patient would need to return to Cancer Center to have a pregnancy test and complete questionnaires prior to randomization.  She verbalized understanding.  Domenica Reamer, BSN, RN, [employer] Research Nurse II [phone] 09/20/2022 1:17 PM

## 2022-09-20 NOTE — Progress Notes (Signed)
[facility] CONSULT NOTE  Patient Care Team: [name], NP as PCP - General (Nurse Practitioner) Marcelyn Bruins, MD as Consulting Physician (Allergy) Pershing Proud, RN as Oncology Nurse Navigator Donnelly Angelica, RN as Oncology Nurse Navigator [name], MD as Consulting Physician (Hematology and Oncology)  CHIEF COMPLAINTS/PURPOSE OF CONSULTATION:  New diagnosis of breast cancer  HISTORY OF PRESENTING ILLNESS:  Sara Browning 47 y.o. 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  She went to her primary doctor had further evaluation with diagnostic mammogram which confirmed left breast mass.  Biopsy was done on the left breast 1:00 mass 6 cm from the nipple which showed grade 3 IDC, ER/PR strongly positive, HER2 1+ by IHC Ki-67 of 70%.  Lymph node biopsy from the left axilla was negative for malignancy.  They recommended an MRI.  Patient lives in [place] but did not want to continue care in [place] hence referrals were made to medical oncology and surgical oncology in [place].  She arrived to the appointment today with her husband.  She currently works as a Teacher, early years/pre for [employer].  She was never deployed even while in [employer] but worked with several solvents.  She has 3 children, age at first birth of 77.  No history of breast-feeding.  No history of prolonged use of birth control.  She is adopted hence does not know her family history on her maternal side but no evidence of breast cancer or other cancers from paternal side.  She is originally from [place] and tells me that cancers are not really common back there. 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  She does not follow-up with ID locally.    Since her last visit here, she had left breast lumpectomy which showed a 3.1 cm invasive  ductal carcinoma in maximal dimension, grade 3, negative margins, ER +80% strong staining PR 90% positive strong staining HER2 negative 1+, Ki-67 of 70% no evidence of lymph node involvement. Oncotype DX of 24, distant recurrence risk at 9 years of 10% with antiestrogen therapy alone.  Recommended that she participate in the OFSET trial.  She is here once again to discuss about this.  Outside clinical trial, given premenopausal status for Oncotype DX greater than 16, I would recommend adjuvant chemotherapy.  Given node-negative disease, I would recommend docetaxel and cyclophosphamide every 21 days for 4 cycles followed by ovarian suppression and antiestrogen therapy.  She is here with her sister to review recommendations.  MEDICAL HISTORY:  Past Medical History:  Diagnosis Date   Cancer (HCC) 2024   Left Breat Cancer   Hepatitis    Hep B Carrier   Hepatitis 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)    Pneumonia    Hx   Urticaria     SURGICAL HISTORY: Past Surgical History:  Procedure Laterality Date   BREAST BIOPSY Left 07/30/2022   [place] LT RADIOACTIVE SEED LOC 07/30/2022 [facility] MAMMOGRAPHY   BREAST LUMPECTOMY WITH RADIOACTIVE SEED AND SENTINEL LYMPH NODE BIOPSY Left 08/01/2022   Procedure: LEFT BREAST LUMPECTOMY WITH RADIOACTIVE SEED AND SENTINEL LYMPH NODE BIOPSY;  Surgeon: Harriette Bouillon, MD;  Location: [facility] OR;  Service: General;  Laterality: Left;   CESAREAN SECTION     twice    SOCIAL HISTORY: Social History   Socioeconomic History   Marital status: Married  Spouse name: Not on file   Number of children: Not on file   Years of education: Not on file   Highest education level: Not on file  Occupational History   Not on file  Tobacco Use   Smoking status: Never   Smokeless tobacco: Never   Tobacco comments:    Drinks seldom  Vaping Use   Vaping Use: Never used  Substance and Sexual Activity   Alcohol use: Not Currently    Comment: occasionally   Drug use: Never   Sexual activity: Yes   Other Topics Concern   Not on file  Social History Narrative   Not on file   Social Determinants of Health   Financial Resource Strain: Not on file  Food Insecurity: No Food Insecurity (07/19/2022)   Hunger Vital Sign    Worried About Running Out of Food in the Last Year: Never true    Ran Out of Food in the Last Year: Never true  Transportation Needs: No Transportation Needs (07/19/2022)   PRAPARE - Administrator, Civil Service (Medical): No    Lack of Transportation (Non-Medical): No  Physical Activity: Not on file  Stress: Not on file  Social Connections: Not on file  Intimate Partner Violence: Not on file    FAMILY HISTORY: Family History  Problem Relation Age of Onset   Liver cancer Maternal Aunt    Breast cancer Maternal Grandmother    Hypertension Paternal Grandmother    Diabetes Paternal Grandfather     ALLERGIES:  has No Known Allergies.  MEDICATIONS:  Current Outpatient Medications  Medication Sig Dispense Refill   BETA GLUCAN PO Take 2 tablets by mouth daily.     ibuprofen (ADVIL) 200 MG tablet Take 400-600 mg by mouth every 6 (six) hours as needed for moderate pain.     ibuprofen (ADVIL) 800 MG tablet Take 1 tablet (800 mg total) by mouth every 8 (eight) hours as needed. 30 tablet 0   loratadine (CLARITIN) 10 MG tablet Take 10 mg by mouth daily as needed for allergies.     Melatonin 5 MG CHEW Chew 5 mg by mouth at bedtime as needed (sleep).     Multiple Vitamin (MULTIVITAMIN) tablet Take 1 tablet by mouth daily.     OVER THE COUNTER MEDICATION Take 2 capsules by mouth daily. Graviola supplement     OVER THE COUNTER MEDICATION Take 1 Scoop by mouth daily. prolean greens supplement powder     oxyCODONE (OXY IR/ROXICODONE) 5 MG immediate release tablet Take 1 tablet (5 mg total) by mouth every 6 (six) hours as needed for severe pain. 15 tablet 0   No current facility-administered medications for this visit.    REVIEW OF SYSTEMS:   Constitutional:  Denies fevers, chills or abnormal night sweats Eyes: Denies blurriness of vision, double vision or watery eyes Ears, nose, mouth, throat, and face: Denies mucositis or sore throat Respiratory: Denies cough, dyspnea or wheezes Cardiovascular: Denies palpitation, chest discomfort or lower extremity swelling Gastrointestinal:  Denies nausea, heartburn or change in bowel habits Skin: Denies abnormal skin rashes Lymphatics: Denies new lymphadenopathy or easy bruising Neurological:Denies numbness, tingling or new weaknesses Behavioral/Psych: Mood is stable, no new changes  Breast: Palpable breast lump in the left breast All other systems were reviewed with the patient and are negative.  PHYSICAL EXAMINATION: ECOG PERFORMANCE STATUS: 0 - Asymptomatic  Vitals:   09/20/22 1101  BP: (!) 121/56  Pulse: 82  Resp: 16  Temp: 98.1 F (36.7  C)  SpO2: 99%    Filed Weights   09/20/22 1101  Weight: 153 lb 1.6 oz (69.4 kg)     GENERAL:alert, no distress and comfortable Rest of the physical exam deferred in lieu of counseling  LABORATORY DATA:  I have reviewed the data as listed Lab Results  Component Value Date   WBC 7.0 07/31/2022   HGB 13.4 07/31/2022   HCT 39.8 07/31/2022   MCV 85.6 07/31/2022   PLT 253 07/31/2022   Lab Results  Component Value Date   NA 133 (L) 07/31/2022   K 4.1 07/31/2022   CL 102 07/31/2022   CO2 27 07/31/2022    RADIOGRAPHIC STUDIES: I have personally reviewed the radiological reports and agreed with the findings in the report.  ASSESSMENT AND PLAN:  Malignant neoplasm of upper-outer quadrant of left breast in female, estrogen receptor positive (HCC) This is a very pleasant 47 year old premenopausal female patient with no significant family history of breast cancer with newly diagnosed left breast upper outer quadrant T2 N0 M0 grade 3 IDC, ER/PR strongly positive HER2 1+ by IHC Ki-67 of 70% referred to medical oncology and surgical oncology for  recommendations.  I agree with the considering breast MRI for accurate sizing.  Given strong ER/PR positivity despite high-grade and high proliferation index, if she is indeed node-negative is appropriate to continue or proceed with surgery upfront.  She is status post lumpectomy, final pathology showed a grade three 3.1 cm IDC, ER/PR positive HER2 negative, negative margins, negative lymph nodes.  Oncotype DX of 24.  She will be an excellent candidate for OFSET trial.  She is interested in participating because she would like to try and avoid chemo if possible but she understands that if she were to be randomized to the chemo arm, she will still get chemotherapy.  Outside the trial in women who are premenopausal and with an Oncotype DX of 16 or greater, we will offer chemotherapy.  We have discussed about docetaxel and cyclophosphamide every 21 days for 4 cycles.  I have discussed the details of chemotherapy and adverse effects of chemotherapy including but not limited to fatigue, nausea, vomiting, diarrhea, increased risk of infections, neuropathy etc. She once again is here to discuss about trial, she apparently had some concerns after reading through the consent. We went over the details of the trial again. We discussed that she will get [facility] outside the trial which is chemotherapy with OFS and AI. I once again went over the chemotherapy details today with her and her sister. I used teach back technique to make sure she understands. At this time, we are awaiting ID recommendations. She is a hepatitis B carrier. She will be seeing them on Tuesday. I will try to coordinate with Dr Earlene Plater from ID I will plan to call her on Wednesday. We also gave her details on how to order the cold cap in case she is interested to proceed.   Total time spent: 40 minutes including history, physical exam, review of records, counseling and coordination of care All questions were answered. The patient knows to call the  clinic with any problems, questions or concerns.    [name], MD 09/20/22

## 2022-09-23 ENCOUNTER — Telehealth: Payer: Self-pay | Admitting: Hematology and Oncology

## 2022-09-23 ENCOUNTER — Other Ambulatory Visit (HOSPITAL_COMMUNITY): Payer: Self-pay

## 2022-09-23 ENCOUNTER — Telehealth: Payer: Self-pay | Admitting: Pharmacy Technician

## 2022-09-23 NOTE — Telephone Encounter (Signed)
Spoke with patient confirming upcoming appointment  

## 2022-09-23 NOTE — Telephone Encounter (Addendum)
[facility] Patient Product/process development scientist completed.    The patient is insured through [employer] and has a $0 copay.

## 2022-09-24 ENCOUNTER — Encounter: Payer: Self-pay | Admitting: Family

## 2022-09-24 ENCOUNTER — Other Ambulatory Visit: Payer: Self-pay

## 2022-09-24 ENCOUNTER — Ambulatory Visit (INDEPENDENT_AMBULATORY_CARE_PROVIDER_SITE_OTHER): Payer: BC Managed Care – PPO | Admitting: Family

## 2022-09-24 ENCOUNTER — Ambulatory Visit: Payer: BC Managed Care – PPO | Admitting: Internal Medicine

## 2022-09-24 VITALS — BP 118/72 | HR 80 | Temp 98.0°F | Resp 16 | Wt 156.0 lb

## 2022-09-24 DIAGNOSIS — B181 Chronic viral hepatitis B without delta-agent: Secondary | ICD-10-CM

## 2022-09-24 NOTE — Progress Notes (Unsigned)
Subjective:    Patient ID: Sara Browning, female    DOB: 04-Oct-1975, 47 y.o.   MRN: [id number]  Chief Complaint  Patient presents with   New Patient (Initial Visit)    Hep B    HPI:  Sara Browning is a 47 y.o. female with previous medical history of estrogen receptor positive breast cancer and Hepatitis B  presenting today for evaluation of Hepatitis B.   Sara Browning was born in the [facility] and found out that she was Hepatitis B positive when she joined the [employer]. Siblings are also positive for Hepatitis B. Adopted so unaware of family history. Has not received any treatment for Hepatitis B in the past and is currently asymptomatic and denies abdominal pain, nausea, vomiting, fatigue, fever, scleral icterus or jaundice. Currently being seen and treated by Oncology for breast cancer and may be a candidate for a study for breast cancer treatment..   Lab work available for review from 2018 with positive Hepatitis B surface antigen and Hepatitis B DNA 78 with normal liver function testing. Has not had any additional blood work or follow up since that time.     No Known Allergies    Outpatient Medications Prior to Visit  Medication Sig Dispense Refill   BETA GLUCAN PO Take 2 tablets by mouth daily.     ibuprofen (ADVIL) 200 MG tablet Take 400-600 mg by mouth every 6 (six) hours as needed for moderate pain.     ibuprofen (ADVIL) 800 MG tablet Take 1 tablet (800 mg total) by mouth every 8 (eight) hours as needed. 30 tablet 0   loratadine (CLARITIN) 10 MG tablet Take 10 mg by mouth daily as needed for allergies.     Melatonin 5 MG CHEW Chew 5 mg by mouth at bedtime as needed (sleep).     Multiple Vitamin (MULTIVITAMIN) tablet Take 1 tablet by mouth daily.     OVER THE COUNTER MEDICATION Take 2 capsules by mouth daily. Graviola supplement     OVER THE COUNTER MEDICATION Take 1 Scoop by mouth daily. prolean greens supplement powder     oxyCODONE (OXY IR/ROXICODONE) 5 MG  immediate release tablet Take 1 tablet (5 mg total) by mouth every 6 (six) hours as needed for severe pain. 15 tablet 0   No facility-administered medications prior to visit.     Past Medical History:  Diagnosis Date   Cancer (HCC) 2024   Left Breat Cancer   Hepatitis    Hep B Carrier   Hepatitis 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)    Pneumonia    Hx   Urticaria      Past Surgical History:  Procedure Laterality Date   BREAST BIOPSY Left 07/30/2022   [place] LT RADIOACTIVE SEED LOC 07/30/2022 [facility] MAMMOGRAPHY   BREAST LUMPECTOMY WITH RADIOACTIVE SEED AND SENTINEL LYMPH NODE BIOPSY Left 08/01/2022   Procedure: LEFT BREAST LUMPECTOMY WITH RADIOACTIVE SEED AND SENTINEL LYMPH NODE BIOPSY;  Surgeon: Harriette Bouillon, MD;  Location: [facility] OR;  Service: General;  Laterality: Left;   CESAREAN SECTION     twice       Review of Systems  Constitutional:  Negative for chills, fatigue, fever and unexpected weight change.  Respiratory:  Negative for cough, chest tightness, shortness of breath and wheezing.   Cardiovascular:  Negative for chest pain and leg swelling.  Gastrointestinal:  Negative for abdominal distention, constipation, diarrhea, nausea and vomiting.  Neurological:  Negative for dizziness, weakness, light-headedness and headaches.  Hematological:  Does not  bruise/bleed easily.      Objective:    BP 118/72   Pulse 80   Temp 98 F (36.7 C) (Oral)   Resp 16   Wt 156 lb (70.8 kg)   LMP 09/16/2022   SpO2 100%   BMI 27.63 kg/m  Nursing note and vital signs reviewed.  Physical Exam Constitutional:      General: She is not in acute distress.    Appearance: She is well-developed.  Cardiovascular:     Rate and Rhythm: Normal rate and regular rhythm.     Heart sounds: Normal heart sounds.  Pulmonary:     Effort: Pulmonary effort is normal.     Breath sounds: Normal breath sounds.  Skin:    General: Skin is warm and dry.  Neurological:     Mental Status: She is alert and oriented to person,  place, and time.  Psychiatric:        Mood and Affect: Mood normal.         09/24/2022    2:45 PM  Depression screen PHQ 2/9  Decreased Interest 0  Down, Depressed, Hopeless 0  PHQ - 2 Score 0       Assessment & Plan:    Patient Active Problem List   Diagnosis Date Noted   Chronic viral hepatitis B without delta agent and without coma (HCC) 09/25/2022   Genetic testing 07/25/2022   Malignant neoplasm of upper-outer quadrant of left breast in female, estrogen receptor positive (HCC) 07/09/2022     Problem List Items Addressed This Visit       Digestive   Chronic viral hepatitis B without delta agent and without coma (HCC) - Primary    Sara Browning has chronic Hepatitis B. Suspect this is likely to be chronic inactive but will need updated lab work to determine. Discussed the basics of Hepatitis B including transmission, risks ,lab work, treatment options including side effects, and plan of care. Based on previous lab work no treatment would be indicated and would continue to monitor. She will need treatment for breast cancer chemotherapy is a risk factor for Hepatitis B reactivation/flare. In this case I would recommend prophylactic treatment while on chemotherapy to reduce this risk with either tenofovir or entecavir. Likley would be able to come off prophylaxis once immune suppression effects of chemotherapy is resolved. Check lab work today including [place]. Additional treatment recommendations pending results.       Relevant Orders   Hepatitis B surface antibody,quantitative   Hepatitis B surface antigen   Hepatitis delta antibody   Hepatitis B surface antibody,qualitative   Hepatitis B e antigen   Hepatitis B e antibody   Hepatitis B DNA, ultraquantitative, PCR   Hepatitis B core antibody, total   Hepatitis B core antibody, IgM   Protime-INR (Completed)   Liver Fibrosis, FibroTest-ActiTest   US ABDOMEN COMPLETE W/ELASTOGRAPHY     I am having Sara Browning maintain  her multivitamin, BETA GLUCAN PO, OVER THE COUNTER MEDICATION, OVER THE COUNTER MEDICATION, ibuprofen, Melatonin, loratadine, oxyCODONE, and ibuprofen.   Follow-up: Return in about 6 months (around 03/26/2023), or if symptoms worsen or fail to improve.   Marcos Eke, MSN, FNP-C Nurse Practitioner [facility] Main number: [phone]

## 2022-09-24 NOTE — Patient Instructions (Addendum)
Nice to see you.  We will check your lab work today.  They will schedule your ultrasound.   Plan for follow up in 6 months or sooner if needed with lab work on the same day.  Have a great day and stay safe!

## 2022-09-25 ENCOUNTER — Encounter: Payer: Self-pay | Admitting: Family

## 2022-09-25 ENCOUNTER — Encounter: Payer: Self-pay | Admitting: Hematology and Oncology

## 2022-09-25 ENCOUNTER — Inpatient Hospital Stay: Payer: BC Managed Care – PPO | Attending: Hematology and Oncology | Admitting: Hematology and Oncology

## 2022-09-25 DIAGNOSIS — Z803 Family history of malignant neoplasm of breast: Secondary | ICD-10-CM | POA: Insufficient documentation

## 2022-09-25 DIAGNOSIS — Z800 Family history of malignant neoplasm of digestive organs: Secondary | ICD-10-CM | POA: Insufficient documentation

## 2022-09-25 DIAGNOSIS — Z5189 Encounter for other specified aftercare: Secondary | ICD-10-CM | POA: Insufficient documentation

## 2022-09-25 DIAGNOSIS — Z170 Estrogen receptor positive status [ER+]: Secondary | ICD-10-CM | POA: Diagnosis not present

## 2022-09-25 DIAGNOSIS — C50412 Malignant neoplasm of upper-outer quadrant of left female breast: Secondary | ICD-10-CM

## 2022-09-25 DIAGNOSIS — Z5111 Encounter for antineoplastic chemotherapy: Secondary | ICD-10-CM | POA: Insufficient documentation

## 2022-09-25 DIAGNOSIS — B181 Chronic viral hepatitis B without delta-agent: Secondary | ICD-10-CM | POA: Insufficient documentation

## 2022-09-25 LAB — HEPATITIS B E ANTIGEN: Hep B E Ag: NONREACTIVE

## 2022-09-25 LAB — HEPATITIS B SURFACE ANTIGEN: Hepatitis B Surface Ag: REACTIVE — AB

## 2022-09-25 LAB — HEPATITIS B CORE ANTIBODY, IGM: Hep B C IgM: NONREACTIVE

## 2022-09-25 LAB — HEPATITIS B E ANTIBODY: Hep B E Ab: REACTIVE — AB

## 2022-09-25 NOTE — Progress Notes (Signed)
[facility] CONSULT NOTE  Patient Care Team: [name], NP as PCP - General (Nurse Practitioner) Marcelyn Bruins, MD as Consulting Physician (Allergy) Pershing Proud, RN as Oncology Nurse Navigator Donnelly Angelica, RN as Oncology Nurse Navigator [name], MD as Consulting Physician (Hematology and Oncology)  CHIEF COMPLAINTS/PURPOSE OF CONSULTATION:  New diagnosis of breast cancer  HISTORY OF PRESENTING ILLNESS:  Sara Browning 47 y.o. 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  She went to her primary doctor had further evaluation with diagnostic mammogram which confirmed left breast mass.  Biopsy was done on the left breast 1:00 mass 6 cm from the nipple which showed grade 3 IDC, ER/PR strongly positive, HER2 1+ by IHC Ki-67 of 70%.  Lymph node biopsy from the left axilla was negative for malignancy.  They recommended an MRI.  Patient lives in [place] but did not want to continue care in [place] hence referrals were made to medical oncology and surgical oncology in [place].  She arrived to the appointment today with her husband.  She currently works as a Teacher, early years/pre for [employer].  She was never deployed even while in [employer] but worked with several solvents.  She has 3 children, age at first birth of 14.  No history of breast-feeding.  No history of prolonged use of birth control.  She is adopted hence does not know her family history on her maternal side but no evidence of breast cancer or other cancers from paternal side.  She is originally from [place] and tells me that cancers are not really common back there. 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  She does not follow-up with ID locally.    Since her last visit here, she had left breast lumpectomy which showed a 3.1 cm invasive  ductal carcinoma in maximal dimension, grade 3, negative margins, ER +80% strong staining PR 90% positive strong staining HER2 negative 1+, Ki-67 of 70% no evidence of lymph node involvement. Oncotype DX of 24, distant recurrence risk at 9 years of 10% with antiestrogen therapy alone.  Recommended that she participate in the OFSET trial.  She is here once again to discuss about this.  Outside clinical trial, given premenopausal status for Oncotype DX greater than 16, I would recommend adjuvant chemotherapy.  Given node-negative disease, I would recommend docetaxel and cyclophosphamide every 21 days for 4 cycles followed by ovarian suppression and antiestrogen therapy.  She is here with her sister to review recommendations.  MEDICAL HISTORY:  Past Medical History:  Diagnosis Date   Cancer (HCC) 2024   Left Breat Cancer   Hepatitis    Hep B Carrier   Hepatitis 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)    Pneumonia    Hx   Urticaria     SURGICAL HISTORY: Past Surgical History:  Procedure Laterality Date   BREAST BIOPSY Left 07/30/2022   [place] LT RADIOACTIVE SEED LOC 07/30/2022 [facility] MAMMOGR[facility]   BREAST LUMPECTOMY WITH RADIOACTIVE SEED AND SENTINEL LYMPH NODE BIOPSY Left 08/01/2022   Procedure: LEFT BREAST LUMPECTOMY WITH RADIOACTIVE SEED AND SENTINEL LYMPH NODE BIOPSY;  Surgeon: Harriette Bouillon, MD;  Location: [facility] OR;  Service: General;  Laterality: Left;   CESAREAN SECTION     twice    SOCIAL HISTORY: Social History   Socioeconomic History   Marital status: Married  Spouse name: Not on file   Number of children: Not on file   Years of education: Not on file   Highest education level: Not on file  Occupational History   Not on file  Tobacco Use   Smoking status: Never   Smokeless tobacco: Never   Tobacco comments:    Drinks seldom  Vaping Use   Vaping Use: Never used  Substance and Sexual Activity   Alcohol use: Not Currently    Comment: occasionally   Drug use: Never   Sexual activity: Yes   Other Topics Concern   Not on file  Social History Narrative   Not on file   Social Determinants of Health   Financial Resource Strain: Not on file  Food Insecurity: No Food Insecurity (07/19/2022)   Hunger Vital Sign    Worried About Running Out of Food in the Last Year: Never true    Ran Out of Food in the Last Year: Never true  Transportation Needs: No Transportation Needs (07/19/2022)   PRAPARE - Administrator, Civil Service (Medical): No    Lack of Transportation (Non-Medical): No  Physical Activity: Not on file  Stress: Not on file  Social Connections: Not on file  Intimate Partner Violence: Not on file    FAMILY HISTORY: Family History  Problem Relation Age of Onset   Liver cancer Maternal Aunt    Breast cancer Maternal Grandmother    Hypertension Paternal Grandmother    Diabetes Paternal Grandfather     ALLERGIES:  has No Known Allergies.  MEDICATIONS:  Current Outpatient Medications  Medication Sig Dispense Refill   BETA GLUCAN PO Take 2 tablets by mouth daily.     ibuprofen (ADVIL) 800 MG tablet Take 1 tablet (800 mg total) by mouth every 8 (eight) hours as needed. 30 tablet 0   loratadine (CLARITIN) 10 MG tablet Take 10 mg by mouth daily as needed for allergies.     Melatonin 5 MG CHEW Chew 5 mg by mouth at bedtime as needed (sleep).     Multiple Vitamin (MULTIVITAMIN) tablet Take 1 tablet by mouth daily.     OVER THE COUNTER MEDICATION Take 2 capsules by mouth daily. Graviola supplement     OVER THE COUNTER MEDICATION Take 1 Scoop by mouth daily. prolean greens supplement powder     oxyCODONE (OXY IR/ROXICODONE) 5 MG immediate release tablet Take 1 tablet (5 mg total) by mouth every 6 (six) hours as needed for severe pain. 15 tablet 0   No current facility-administered medications for this visit.    REVIEW OF SYSTEMS:   Constitutional: Denies fevers, chills or abnormal night sweats Eyes: Denies blurriness of vision, double vision or watery  eyes Ears, nose, mouth, throat, and face: Denies mucositis or sore throat Respiratory: Denies cough, dyspnea or wheezes Cardiovascular: Denies palpitation, chest discomfort or lower extremity swelling Gastrointestinal:  Denies nausea, heartburn or change in bowel habits Skin: Denies abnormal skin rashes Lymphatics: Denies new lymphadenopathy or easy bruising Neurological:Denies numbness, tingling or new weaknesses Behavioral/Psych: Mood is stable, no new changes  Breast: Palpable breast lump in the left breast All other systems were reviewed with the patient and are negative.  PHYSICAL EXAMINATION: ECOG PERFORMANCE STATUS: 0 - Asymptomatic  There were no vitals filed for this visit.   There were no vitals filed for this visit.    GENERAL:alert, no distress and comfortable Rest of the physical exam deferred in lieu of telephone visit  LABORATORY DATA:  I have  reviewed the data as listed Lab Results  Component Value Date   WBC 7.0 07/31/2022   HGB 13.4 07/31/2022   HCT 39.8 07/31/2022   MCV 85.6 07/31/2022   PLT 253 07/31/2022   Lab Results  Component Value Date   NA 133 (L) 07/31/2022   K 4.1 07/31/2022   CL 102 07/31/2022   CO2 27 07/31/2022    RADIOGRAPHIC STUDIES: I have personally reviewed the radiological reports and agreed with the findings in the report.  ASSESSMENT AND PLAN:  Malignant neoplasm of upper-outer quadrant of left breast in female, estrogen receptor positive (HCC) This is a very pleasant 48 year old premenopausal female patient with no significant family history of breast cancer with newly diagnosed left breast upper outer quadrant T2 N0 M0 grade 3 IDC, ER/PR strongly positive HER2 1+ by IHC Ki-67 of 70% referred to medical oncology and surgical oncology for recommendations.  I agree with the considering breast MRI for accurate sizing.  Given strong ER/PR positivity despite high-grade and high proliferation index, if she is indeed node-negative is  appropriate to continue or proceed with surgery upfront.  She is status post lumpectomy, final pathology showed a grade three 3.1 cm IDC, ER/PR positive HER2 negative, negative margins, negative lymph nodes.  Oncotype DX of 24.  She will be an excellent candidate for OFSET trial.  She is interested in participating because she would like to try and avoid chemo if possible but she understands that if she were to be randomized to the chemo arm, she will still get chemotherapy.  Outside the trial in women who are premenopausal and with an Oncotype DX of 16 or greater, we will offer chemotherapy.  We have discussed about docetaxel and cyclophosphamide every 21 days for 4 cycles.  I have discussed the details of chemotherapy and adverse effects of chemotherapy including but not limited to fatigue, nausea, vomiting, diarrhea, increased risk of infections, neuropathy etc. This is a follow-up phone call given her ID visit yesterday.  She happens to be a hepatitis B carrier.  We are awaiting some labs ordered by the ID team.  They have discussed about considering prophylactic Entecavir since she may be receiving adjuvant chemotherapy.  I have Dr. Jeanine Luz from ID about this.  I have left him my cell phone number once they have more results to finalize recommendations.  At this time she is a little hesitant to do the prophylactic Entecavir since she was under the impression that she has to continue indefinitely but upon my discussion with Earl Lites, she may be able to come off of it when she is done with chemotherapy.  Will once again regroup after ID recommendations.  We hope to randomize her as soon as we have some additional recommendations.  She understands the plan.    Total time spent: 15 minutes including history, review of records, counseling and coordination of care  I connected with  Sara Browning on 09/25/22 by a telephone application and verified that I am speaking with the correct person using two  identifiers.   I discussed the limitations of evaluation and management by telemedicine. The patient expressed understanding and agreed to proceed.  All questions were answered. The patient knows to call the clinic with any problems, questions or concerns.    [name], MD 09/25/22

## 2022-09-25 NOTE — Assessment & Plan Note (Signed)
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  I agree with the considering breast MRI for accurate sizing.  Given strong ER/PR positivity despite high-grade and high proliferation index, if she is indeed node-negative is appropriate to continue or proceed with surgery upfront.  She is status post lumpectomy, final pathology showed a grade three 3.1 cm IDC, ER/PR positive HER2 negative, negative margins, negative lymph nodes.  Oncotype DX of 24.  She will be an excellent candidate for OFSET trial.  She is interested in participating because she would like to try and avoid chemo if possible but she understands that if she were to be randomized to the chemo arm, she will still get chemotherapy.  Outside the trial in women who are premenopausal and with an Oncotype DX of 16 or greater, we will offer chemotherapy.  We have discussed about docetaxel and cyclophosphamide every 21 days for 4 cycles.  I have discussed the details of chemotherapy and adverse effects of chemotherapy including but not limited to fatigue, nausea, vomiting, diarrhea, increased risk of infections, neuropathy etc. This is a follow-up phone call given her ID visit yesterday.  She happens to be a hepatitis B carrier.  We are awaiting some labs ordered by the ID team.  They have discussed about considering prophylactic Entecavir since she may be receiving adjuvant chemotherapy.  I have Dr. Jeanine Luz from ID about this.  I have left him my cell phone number once they have more results to finalize recommendations.  At this time she is a little hesitant to do the prophylactic Entecavir since she was under the impression that she has to continue indefinitely but upon my discussion  with Earl Lites, she may be able to come off of it when she is done with chemotherapy.  Will once again regroup after ID recommendations.  We hope to randomize her as soon as we have some additional recommendations.  She understands the plan.

## 2022-09-25 NOTE — Assessment & Plan Note (Signed)
Sara Browning has chronic Hepatitis B. Suspect this is likely to be chronic inactive but will need updated lab work to determine. Discussed the basics of Hepatitis B including transmission, risks ,lab work, treatment options including side effects, and plan of care. Based on previous lab work no treatment would be indicated and would continue to monitor. She will need treatment for breast cancer chemotherapy is a risk factor for Hepatitis B reactivation/flare. In this case I would recommend prophylactic treatment while on chemotherapy to reduce this risk with either tenofovir or entecavir. Likley would be able to come off prophylaxis once immune suppression effects of chemotherapy is resolved. Check lab work today including [place]. Additional treatment recommendations pending results.

## 2022-09-26 ENCOUNTER — Ambulatory Visit (HOSPITAL_COMMUNITY)
Admission: RE | Admit: 2022-09-26 | Discharge: 2022-09-26 | Disposition: A | Discharge status: Home / Self Care | Payer: BC Managed Care – PPO | Source: Ambulatory Visit | Attending: Family | Admitting: Family

## 2022-09-26 DIAGNOSIS — B181 Chronic viral hepatitis B without delta-agent: Secondary | ICD-10-CM | POA: Diagnosis not present

## 2022-09-27 ENCOUNTER — Encounter: Payer: Self-pay | Admitting: Hematology and Oncology

## 2022-09-27 LAB — HEPATITIS B DNA, ULTRAQUANTITATIVE, PCR
Hepatitis B DNA: 43 IU/mL — ABNORMAL HIGH
Hepatitis B virus DNA: 1.63 Log IU/mL — ABNORMAL HIGH

## 2022-09-30 ENCOUNTER — Encounter: Payer: Self-pay | Admitting: Hematology and Oncology

## 2022-09-30 ENCOUNTER — Telehealth: Payer: Self-pay | Admitting: Family

## 2022-09-30 ENCOUNTER — Telehealth: Payer: Self-pay | Admitting: Hematology and Oncology

## 2022-09-30 ENCOUNTER — Telehealth: Payer: Self-pay | Admitting: *Deleted

## 2022-09-30 ENCOUNTER — Other Ambulatory Visit: Payer: Self-pay | Admitting: Hematology and Oncology

## 2022-09-30 ENCOUNTER — Other Ambulatory Visit (HOSPITAL_COMMUNITY): Payer: Self-pay

## 2022-09-30 DIAGNOSIS — B181 Chronic viral hepatitis B without delta-agent: Secondary | ICD-10-CM

## 2022-09-30 DIAGNOSIS — C50412 Malignant neoplasm of upper-outer quadrant of left female breast: Secondary | ICD-10-CM

## 2022-09-30 MED ORDER — VEMLIDY 25 MG PO TABS
1.0000 | ORAL_TABLET | Freq: Every day | ORAL | 5 refills | Status: DC
Start: 2022-09-30 — End: 2022-10-02

## 2022-09-30 NOTE — Telephone Encounter (Signed)
Spoke with patient confirming upcoming appointment  

## 2022-09-30 NOTE — Telephone Encounter (Signed)
[id number]: A Phase III Adjuvant Trial Evaluating the Addition of Adjuvant Chemotherapy to Ovarian Function Suppression plus Endocrine Therapy in Premenopausal Patients with pN0-1, ER-Positive/HER2-Negative Breast Cancer and an Oncotype Recurrence Score ? 25 (OFSET)  Informed patient that upon review of her hepatitis B lab results, she does not meet eligibility for the study. Dr. Al Pimple is aware and will request chemotherapy and chemotherapy education appointments to be scheduled.  Infectious Disease will contact patient to start medication for prophylactic treatment to help prevent hepatitis activation while on chemotherapy.  Patient verbalized understanding of plan. Thanked patient for her time and willingness to participate in this research study. Encouraged her to call back if any questions before her next appointment.  Domenica Reamer, BSN, RN, [employer] Research Nurse II [phone] 09/30/2022 2:28 PM

## 2022-09-30 NOTE — Progress Notes (Signed)
START ON PATHWAY REGIMEN - Breast     A cycle is every 21 days:     Cyclophosphamide      Docetaxel   **Always confirm dose/schedule in your pharmacy ordering system**  Patient Characteristics: Postoperative without Neoadjuvant Therapy, M0 (Pathologic Staging), Invasive Disease, Adjuvant Therapy, HER2 Negative, ER Positive, Node Negative, pT1a, pN77mi or pT1b-c, pN0/N44mi or pT2 or Higher, pN0, Oncotype Intermediate Risk (16-25), Premenopausal,  Chemotherapy Preferred Therapeutic Status: Postoperative without Neoadjuvant Therapy, M0 (Pathologic Staging) AJCC Grade: G3 AJCC N Category: pN0 AJCC M Category: cM0 ER Status: Positive (+) AJCC 8 Stage Grouping: IB HER2 Status: Negative (-) Oncotype Dx Recurrence Score: 24 AJCC T Category: pT2 PR Status: Positive (+) Has this patient completed genomic testing<= Yes - Oncotype DX(R) Menopausal Status: Premenopausal Treatment Preferred: Chemotherapy Intent of Therapy: Curative Intent, Discussed with Patient

## 2022-09-30 NOTE — Telephone Encounter (Signed)
Spoke with Ms. Meubed regarding testing results and plan of care. Vemlidy 25 mg PO daily has been sent to the pharmacy. Reviewed administration and possible side effects. Will plan to continue with medication for duration of chemotherapy and immune suppression. Plan for follow up in 1 month or sooner if needed.   Marcos Eke, NP 09/30/2022 2:41 PM

## 2022-10-01 ENCOUNTER — Other Ambulatory Visit: Payer: Self-pay | Admitting: Hematology and Oncology

## 2022-10-01 ENCOUNTER — Telehealth: Payer: Self-pay | Admitting: Hematology and Oncology

## 2022-10-01 ENCOUNTER — Other Ambulatory Visit: Payer: Self-pay

## 2022-10-01 ENCOUNTER — Telehealth: Payer: Self-pay

## 2022-10-01 NOTE — Progress Notes (Signed)
Pharmacist Chemotherapy Monitoring - Initial Assessment    Anticipated start date: 10/08/2022   The following has been reviewed per standard work regarding the patient's treatment regimen: The patient's diagnosis, treatment plan and drug doses, and organ/hematologic function Lab orders and baseline tests specific to treatment regimen  The treatment plan start date, drug sequencing, and pre-medications Prior authorization status  Patient's documented medication list, including drug-drug interaction screen and prescriptions for anti-emetics and supportive care specific to the treatment regimen The drug concentrations, fluid compatibility, administration routes, and timing of the medications to be used The patient's access for treatment and lifetime cumulative dose history, if applicable  The patient's medication allergies and previous infusion related reactions, if applicable   Changes made to treatment plan:  treatment plan date  Follow up needed:  Pending authorization for treatment    Briant Cedar, RPH, 10/01/2022  1:50 PM

## 2022-10-01 NOTE — Telephone Encounter (Signed)
Pt is aware of appts for chemo ed tomorrow and lab, md visit and 1st tx Tuesday.

## 2022-10-01 NOTE — Progress Notes (Signed)
Treatment deferred by 2 weeks to 6/25 per recommendation from ID given her Hep B status. Chemo dates adjusted and scheduling team noted.  Sara Browning

## 2022-10-01 NOTE — Telephone Encounter (Signed)
-----   Message from [name], MD sent at 09/30/2022  1:54 PM EDT ----- Ms Haydon needs a teach and will have to see me on Monday, with chemo start on Tuesday please. She absolutely must see me before chemo and I prefer to see her day before chemo.

## 2022-10-02 ENCOUNTER — Encounter: Payer: Self-pay | Admitting: Hematology and Oncology

## 2022-10-02 ENCOUNTER — Other Ambulatory Visit: Payer: Self-pay | Admitting: Family

## 2022-10-02 ENCOUNTER — Encounter: Payer: Self-pay | Admitting: *Deleted

## 2022-10-02 ENCOUNTER — Inpatient Hospital Stay: Payer: BC Managed Care – PPO

## 2022-10-02 ENCOUNTER — Other Ambulatory Visit (HOSPITAL_COMMUNITY): Payer: Self-pay

## 2022-10-02 ENCOUNTER — Other Ambulatory Visit: Payer: Self-pay

## 2022-10-02 ENCOUNTER — Other Ambulatory Visit: Payer: Self-pay | Admitting: Hematology and Oncology

## 2022-10-02 ENCOUNTER — Telehealth: Payer: Self-pay | Admitting: Pharmacist

## 2022-10-02 ENCOUNTER — Inpatient Hospital Stay: Payer: BC Managed Care – PPO | Admitting: Pharmacist

## 2022-10-02 DIAGNOSIS — Z800 Family history of malignant neoplasm of digestive organs: Secondary | ICD-10-CM | POA: Diagnosis not present

## 2022-10-02 DIAGNOSIS — Z803 Family history of malignant neoplasm of breast: Secondary | ICD-10-CM | POA: Diagnosis not present

## 2022-10-02 DIAGNOSIS — C50412 Malignant neoplasm of upper-outer quadrant of left female breast: Secondary | ICD-10-CM | POA: Diagnosis not present

## 2022-10-02 DIAGNOSIS — Z170 Estrogen receptor positive status [ER+]: Secondary | ICD-10-CM

## 2022-10-02 DIAGNOSIS — Z5189 Encounter for other specified aftercare: Secondary | ICD-10-CM | POA: Diagnosis not present

## 2022-10-02 DIAGNOSIS — B181 Chronic viral hepatitis B without delta-agent: Secondary | ICD-10-CM

## 2022-10-02 DIAGNOSIS — Z5111 Encounter for antineoplastic chemotherapy: Secondary | ICD-10-CM | POA: Diagnosis not present

## 2022-10-02 MED ORDER — PROCHLORPERAZINE MALEATE 10 MG PO TABS
10.0000 mg | ORAL_TABLET | Freq: Four times a day (QID) | ORAL | 1 refills | Status: DC | PRN
Start: 2022-10-02 — End: 2023-06-09

## 2022-10-02 MED ORDER — DEXAMETHASONE 4 MG PO TABS
ORAL_TABLET | ORAL | 1 refills | Status: DC
Start: 2022-10-02 — End: 2022-10-22

## 2022-10-02 MED ORDER — ONDANSETRON HCL 8 MG PO TABS
8.0000 mg | ORAL_TABLET | Freq: Three times a day (TID) | ORAL | 1 refills | Status: DC | PRN
Start: 2022-10-02 — End: 2023-06-09

## 2022-10-02 MED ORDER — VEMLIDY 25 MG PO TABS
1.0000 | ORAL_TABLET | Freq: Every day | ORAL | 5 refills | Status: DC
Start: 2022-10-02 — End: 2023-06-17
  Filled 2022-10-02 (×3): qty 30, 30d supply, fill #0
  Filled 2022-10-21: qty 30, 30d supply, fill #1
  Filled 2022-11-20: qty 30, 30d supply, fill #2
  Filled 2022-12-18: qty 30, 30d supply, fill #3
  Filled 2023-01-20: qty 30, 30d supply, fill #4

## 2022-10-02 NOTE — Progress Notes (Signed)
[facility]       Telephone: [phone]?Fax: [phone]   Oncology Clinical Pharmacist Practitioner Initial Assessment  Sara Browning is a 47 y.o. female with a diagnosis of breast cancer. They were contacted today via in-person visit. She is accompanied her husband Vincenza Hews and her mother-in-law Lawson Fiscal.   Indication/Regimen Docetaxel (Taxotere) and cyclophosphamide (Cytoxan) are being used appropriately for treatment of breast cancer by Dr. [name].      Wt Readings from Last 1 Encounters:  09/24/22 156 lb (70.8 kg)    Estimated body surface area is 1.77 meters squared as calculated from the following:   Height as of 08/01/22: 5\' 3"  (1.6 m).   Weight as of 09/24/22: 156 lb (70.8 kg).  The dosing regimen cycle is every 21 days x 4 cycles  Docetaxel (75 mg/m2) on Day 1 Cyclophosphamide (600 mg/m2) on Day 1 Pegfilgrastim (6 mg) on Day 3  It is planned to continue until treatment plan completion or unacceptable toxicity. The tentative start date is: 10/15/22. Treatment plan was changed from 10/08/23 to allow patient to start tenofovir. We will notify scheduling as patient scheduled has not been updated with new times.  Dose Modifications No dose modifications at this time   Allergies No Known Allergies  Vitals: No vitals or labs were done today for this chemotherapy education visit     09/24/2022    2:43 PM 09/20/2022   11:01 AM 09/12/2022    4:02 PM  Oncology Vitals  Weight 70.761 kg 69.446 kg 70.943 kg  Weight (lbs) 156 lbs 153 lbs 2 oz 156 lbs 6 oz  BMI 27.63 kg/m2   27.63 kg/m2 27.12 kg/m2   27.12 kg/m2 27.71 kg/m2   27.71 kg/m2  Temp 98 F (36.7 C) 98.1 F (36.7 C) 98.1 F (36.7 C)  Pulse Rate 80 82 86  BP 118/72 121/56 129/57  Resp 16 16 16   SpO2 100 % 99 % 100 %  BSA (m2) 1.77 m2   1.77 m2 1.76 m2   1.76 m2 1.78 m2   1.78 m2     Laboratory Data    Latest Ref Rng & Units 07/31/2022    9:25 AM 03/28/2020   11:18 AM  CBC EXTENDED   WBC 4.0 - 10.5 K/uL 7.0  7.1   RBC 3.87 - 5.11 MIL/uL 4.65  5.19   Hemoglobin 12.0 - 15.0 g/dL 84.6  96.2   HCT 95.2 - 46.0 % 39.8  43.6   Platelets 150 - 400 K/uL 253  257   NEUT# 1.4 - 7.0 x10E3/uL  4.1   Lymph# 0.7 - 3.1 x10E3/uL  2.5        Latest Ref Rng & Units 07/31/2022    9:25 AM 03/28/2020   11:18 AM  CMP  Glucose 70 - 99 mg/dL 99  841   BUN 6 - 20 mg/dL 16  11   Creatinine 3.24 - 1.00 mg/dL 4.01  0.27   Sodium 253 - 145 mmol/L 133  136   Potassium 3.5 - 5.1 mmol/L 4.1  4.5   Chloride 98 - 111 mmol/L 102  101   CO2 22 - 32 mmol/L 27  23   Calcium 8.9 - 10.3 mg/dL 8.8  9.2   Total Protein 6.5 - 8.1 g/dL 7.1  7.5   Total Bilirubin 0.3 - 1.2 mg/dL 0.6  0.5   Alkaline Phos 38 - 126 U/L 61  59   AST 15 - 41 U/L 28  20   ALT 0 - 44 U/L 16  13    Contraindications Contraindications were reviewed? Yes Contraindications to therapy were identified? No   Safety Precautions The following safety precautions were reviewed:  Fever: reviewed the importance of having a thermometer and the Centers for Disease Control and Prevention (CDC) definition of fever which is 100.81F (38C) or higher. Patient should call 24/7 triage at [phone] if experiencing a fever or any other symptoms Decreased white blood cells (WBCs) and increased risk for infection Decreased platelet count and increased risk of bleeding Decreased hemoglobin, part of the red blood cells that carry iron and oxygen Hair Loss Fatigue Fluid retention or swelling (edema) Peripheral Neuropathy Mouth sores Rash or itchy skin Muscle or joint pain or weakness Nausea or vomiting Diarrhea Nail Changes Hypersensitivity reactions Secondary Malignancies Hemorrhagic cystitis Pneumonitis Handling body fluids and waste Intimacy, sexual activity, contraception, fertility  Medication Reconciliation Current Outpatient Medications  Medication Sig Dispense Refill   Multiple Vitamin (MULTIVITAMIN) tablet Take 1 tablet by  mouth daily.     OVER THE COUNTER MEDICATION Take 1 Scoop by mouth daily. prolean greens supplement powder     BETA GLUCAN PO Take 2 tablets by mouth daily. (Patient not taking: Reported on 10/02/2022)     dexamethasone (DECADRON) 4 MG tablet Take 2 tabs by mouth 2 times daily starting day before chemo. Then take 2 tabs daily for 2 days starting day after chemo. Take with food. (Patient not taking: Reported on 10/02/2022) 30 tablet 1   loratadine (CLARITIN) 10 MG tablet Take 10 mg by mouth daily as needed for allergies. (Patient not taking: Reported on 10/02/2022)     Melatonin 5 MG CHEW Chew 5 mg by mouth at bedtime as needed (sleep). (Patient not taking: Reported on 10/02/2022)     ondansetron (ZOFRAN) 8 MG tablet Take 1 tablet (8 mg total) by mouth every 8 (eight) hours as needed for nausea or vomiting. Start on the third day after chemotherapy. (Patient not taking: Reported on 10/02/2022) 30 tablet 1   OVER THE COUNTER MEDICATION Take 2 capsules by mouth daily. Graviola supplement (Patient not taking: Reported on 10/02/2022)     oxyCODONE (OXY IR/ROXICODONE) 5 MG immediate release tablet Take 1 tablet (5 mg total) by mouth every 6 (six) hours as needed for severe pain. (Patient not taking: Reported on 10/02/2022) 15 tablet 0   prochlorperazine (COMPAZINE) 10 MG tablet Take 1 tablet (10 mg total) by mouth every 6 (six) hours as needed for nausea or vomiting. (Patient not taking: Reported on 10/02/2022) 30 tablet 1   Tenofovir Alafenamide Fumarate (VEMLIDY) 25 MG TABS Take 1 tablet (25 mg total) by mouth daily. (Patient not taking: Reported on 10/02/2022) 30 tablet 5   No current facility-administered medications for this visit.   Medication reconciliation is based on the patient's most recent medication list in the electronic medical record (EMR) including herbal products and OTC medications.   The patient's medication list was reviewed today with the patient? Yes   Drug-drug interactions (DDIs) DDIs  were evaluated? Yes Significant DDIs identified? No   Drug-Food Interactions Drug-food interactions were evaluated? Yes Drug-food interactions identified? No   Follow-up Plan  Treatment start date: Tentatively 10/15/22. Treatment dates updated, will notify scheduling to call patient with new times Port placement date: Not applicable. Patient will be peripheral line. Dr. Al Pimple is aware. We reviewed the prescriptions, premedications, and treatment regimen with the patient. Possible side effects of the treatment regimen were reviewed and  management strategies were discussed. Prescriptions sent to local pharmacy of choice.  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  She will start tenofovir when she receives it. This has been prescribed to [facility] after [facility] said they could not fill [facility]. She is being followed by Infectious Disease. Appreciate their recs Clinical pharmacy will assist Dr. [name] and Sherre Lain D Vita on an as needed basis going forward  Raelyn D Kerlin participated in the discussion, expressed understanding, and voiced agreement with the above plan. All questions were answered to her satisfaction. The patient was advised to contact the clinic at [phone] with any questions or concerns prior to her return visit.   I spent 60 minutes assessing the patient.  Iasha Mccalister A. Odetta Pink, PharmD, BCOP, CPP  Anselm Lis, RPH-CPP, 10/02/2022 10:41 AM  **Disclaimer: This note was dictated with voice recognition software. Similar sounding words can inadvertently be transcribed and this note may contain transcription errors which may not have been corrected upon publication of note.**

## 2022-10-02 NOTE — Telephone Encounter (Signed)
Patient calling as she wants to pick up [facility] ASAP to start for several days prior to starting chemotherapy on 6/25. [facility] by Friday, but she wants to pick it up today if possible. [facility] does not have med in stock but can have it ready for her later today. She will pick up from [facility].  Margarite Gouge, PharmD, CPP, BCIDP, AAHIVP Clinical Pharmacist Practitioner Infectious Diseases Clinical Pharmacist [facility]

## 2022-10-02 NOTE — Progress Notes (Signed)
Tenofovir re directed to [facility] so she can get it filled early.  Sara Browning

## 2022-10-02 NOTE — Telephone Encounter (Signed)
Called pt and explained that unfortunately she can't participate in the trial. In that case we should consider chemotherapy since there is about 7% benefit in pre menopausal women with intermediate oncotype score. She expressed understanding> However we will have to wait until she starts tenofovir for about 2 weeks per ID team before we can start chemo. She understands the plan and is agreeable.

## 2022-10-03 ENCOUNTER — Other Ambulatory Visit (HOSPITAL_COMMUNITY): Payer: Self-pay

## 2022-10-08 ENCOUNTER — Other Ambulatory Visit: Payer: BC Managed Care – PPO

## 2022-10-08 ENCOUNTER — Ambulatory Visit: Payer: BC Managed Care – PPO | Admitting: Hematology and Oncology

## 2022-10-08 ENCOUNTER — Ambulatory Visit: Payer: BC Managed Care – PPO

## 2022-10-08 NOTE — Progress Notes (Signed)
Pharmacist Chemotherapy Monitoring - Initial Assessment    Anticipated start date: 10/15/2022   The following has been reviewed per standard work regarding the patient's treatment regimen: The patient's diagnosis, treatment plan and drug doses, and organ/hematologic function Lab orders and baseline tests specific to treatment regimen  The treatment plan start date, drug sequencing, and pre-medications Prior authorization status  Patient's documented medication list, including drug-drug interaction screen and prescriptions for anti-emetics and supportive care specific to the treatment regimen The drug concentrations, fluid compatibility, administration routes, and timing of the medications to be used The patient's access for treatment and lifetime cumulative dose history, if applicable  The patient's medication allergies and previous infusion related reactions, if applicable   Changes made to treatment plan:  N/A  Follow up needed:  Pending authorization for treatment    Briant Cedar, RPH, 10/08/2022  9:25 AM

## 2022-10-08 NOTE — Progress Notes (Signed)
The following biosimilar Udenyca ([id number]) has been selected for use in this patient.   Lakishia Bourassa, PharmD 

## 2022-10-10 ENCOUNTER — Encounter: Payer: Self-pay | Admitting: *Deleted

## 2022-10-10 ENCOUNTER — Ambulatory Visit: Payer: BC Managed Care – PPO

## 2022-10-11 LAB — LIVER FIBROSIS, FIBROTEST-ACTITEST
ALT: 12 U/L (ref 6–29)
Alpha-2-Macroglobulin: 157 mg/dL (ref 106–279)
Apolipoprotein A1: 174 mg/dL (ref 101–198)
Bilirubin: 0.3 mg/dL (ref 0.2–1.2)
Fibrosis Score: 0.07
GGT: 19 U/L (ref 3–55)
Haptoglobin: 58 mg/dL (ref 43–212)
Necroinflammat ACT Score: 0.02
Reference ID: 4970951

## 2022-10-11 LAB — PROTIME-INR
INR: 1
Prothrombin Time: 10.2 s (ref 9.0–11.5)

## 2022-10-11 LAB — HEPATITIS DELTA ANTIBODY: Hepatitis D Ab, Total: NEGATIVE

## 2022-10-11 LAB — HEPATITIS B CORE ANTIBODY, TOTAL: Hep B Core Total Ab: REACTIVE — AB

## 2022-10-11 LAB — HEPATITIS B SURFACE ANTIBODY, QUANTITATIVE: Hep B S AB Quant (Post): <5 m[IU]/mL — ABNORMAL LOW (ref >=10)

## 2022-10-14 ENCOUNTER — Encounter: Payer: Self-pay | Admitting: Hematology and Oncology

## 2022-10-14 ENCOUNTER — Telehealth: Payer: Self-pay | Admitting: *Deleted

## 2022-10-14 MED FILL — Dexamethasone Sodium Phosphate Inj 100 MG/10ML: INTRAMUSCULAR | Qty: 1 | Status: AC

## 2022-10-14 NOTE — Patient Instructions (Signed)
[facility]  Discharge Instructions: Thank you for choosing [facility] to provide your oncology and hematology care.   If you have a lab appointment with the Cancer Center, please go directly to the Cancer Center and check in at the registration area.   Wear comfortable clothing and clothing appropriate for easy access to any Portacath or PICC line.   We strive to give you quality time with your provider. You may need to reschedule your appointment if you arrive late (15 or more minutes).  Arriving late affects you and other patients whose appointments are after yours.  Also, if you miss three or more appointments without notifying the office, you may be dismissed from the clinic at the provider's discretion.      For prescription refill requests, have your pharmacy contact our office and allow 72 hours for refills to be completed.    Today you received the following chemotherapy and/or immunotherapy agents: Imfinzi      To help prevent nausea and vomiting after your treatment, we encourage you to take your nausea medication as directed.  BELOW ARE SYMPTOMS THAT SHOULD BE REPORTED IMMEDIATELY: *FEVER GREATER THAN 100.4 F (38 C) OR HIGHER *CHILLS OR SWEATING *NAUSEA AND VOMITING THAT IS NOT CONTROLLED WITH YOUR NAUSEA MEDICATION *UNUSUAL SHORTNESS OF BREATH *UNUSUAL BRUISING OR BLEEDING *URINARY PROBLEMS (pain or burning when urinating, or frequent urination) *BOWEL PROBLEMS (unusual diarrhea, constipation, pain near the anus) TENDERNESS IN MOUTH AND THROAT WITH OR WITHOUT PRESENCE OF ULCERS (sore throat, sores in mouth, or a toothache) UNUSUAL RASH, SWELLING OR PAIN  UNUSUAL VAGINAL DISCHARGE OR ITCHING   Items with * indicate a potential emergency and should be followed up as soon as possible or go to the Emergency Department if any problems should occur.  Please show the CHEMOTHERAPY ALERT CARD or IMMUNOTHERAPY ALERT CARD at  check-in to the Emergency Department and triage nurse.  Should you have questions after your visit or need to cancel or reschedule your appointment, please contact [facility]  Dept: [phone]  and follow the prompts.  Office hours are 8:00 a.m. to 4:30 p.m. Monday - Friday. Please note that voicemails left after 4:00 p.m. may not be returned until the following business day.  We are closed weekends and major holidays. You have access to a nurse at all times for urgent questions. Please call the main number to the clinic Dept: [phone] and follow the prompts.   For any non-urgent questions, you may also contact your provider using MyChart. We now offer e-Visits for anyone 18 and older to request care online for non-urgent symptoms. For details visit [email or web address].   Also download the MyChart app! Go to the app store, search "MyChart", open the app, select [facility], and log in with your MyChart username and password.   

## 2022-10-14 NOTE — Telephone Encounter (Signed)
DCP-001: Use of a Clinical Trial Screening Tool to Address Cancer Health Disparities in the [employer] Research Program [facility])   Left VM for patient introducing the above study and informing patient this research nurse will talk to her about it tomorrow while she is in infusion room.  Informed that it is voluntary. Also informed patient of her appointment times for tomorrow since there was a MyChart message asking about her appointment times. Asked her to call back if any questions.  Domenica Reamer, BSN, RN, [employer] Research Nurse II [phone] 10/14/2022 2:34 PM

## 2022-10-15 ENCOUNTER — Ambulatory Visit: Payer: BC Managed Care – PPO | Admitting: Nutrition

## 2022-10-15 ENCOUNTER — Other Ambulatory Visit: Payer: Self-pay

## 2022-10-15 ENCOUNTER — Encounter: Payer: Self-pay | Admitting: *Deleted

## 2022-10-15 ENCOUNTER — Encounter: Payer: Self-pay | Admitting: Adult Health

## 2022-10-15 ENCOUNTER — Inpatient Hospital Stay (HOSPITAL_BASED_OUTPATIENT_CLINIC_OR_DEPARTMENT_OTHER): Payer: BC Managed Care – PPO

## 2022-10-15 ENCOUNTER — Inpatient Hospital Stay (HOSPITAL_BASED_OUTPATIENT_CLINIC_OR_DEPARTMENT_OTHER): Payer: BC Managed Care – PPO | Admitting: Adult Health

## 2022-10-15 ENCOUNTER — Encounter: Payer: Self-pay | Admitting: Hematology and Oncology

## 2022-10-15 ENCOUNTER — Telehealth: Payer: Self-pay | Admitting: Adult Health

## 2022-10-15 ENCOUNTER — Inpatient Hospital Stay: Payer: BC Managed Care – PPO

## 2022-10-15 VITALS — BP 122/71 | HR 74 | Temp 98.2°F | Resp 17

## 2022-10-15 DIAGNOSIS — Z800 Family history of malignant neoplasm of digestive organs: Secondary | ICD-10-CM | POA: Diagnosis not present

## 2022-10-15 DIAGNOSIS — C50412 Malignant neoplasm of upper-outer quadrant of left female breast: Secondary | ICD-10-CM

## 2022-10-15 DIAGNOSIS — Z170 Estrogen receptor positive status [ER+]: Secondary | ICD-10-CM | POA: Diagnosis not present

## 2022-10-15 DIAGNOSIS — Z803 Family history of malignant neoplasm of breast: Secondary | ICD-10-CM | POA: Diagnosis not present

## 2022-10-15 DIAGNOSIS — Z5189 Encounter for other specified aftercare: Secondary | ICD-10-CM | POA: Diagnosis not present

## 2022-10-15 DIAGNOSIS — Z5111 Encounter for antineoplastic chemotherapy: Secondary | ICD-10-CM | POA: Diagnosis not present

## 2022-10-15 DIAGNOSIS — B181 Chronic viral hepatitis B without delta-agent: Secondary | ICD-10-CM | POA: Diagnosis not present

## 2022-10-15 LAB — CBC WITH DIFFERENTIAL (CANCER CENTER ONLY)
Abs Immature Granulocytes: 0.07 10*3/uL (ref 0.00–0.07)
Basophils Absolute: 0 10*3/uL (ref 0.0–0.1)
Basophils Relative: 0 %
Eosinophils Absolute: 0 10*3/uL (ref 0.0–0.5)
Eosinophils Relative: 0 %
HCT: 41.4 % (ref 36.0–46.0)
Hemoglobin: 14.1 g/dL (ref 12.0–15.0)
Immature Granulocytes: 1 %
Lymphocytes Relative: 11 %
Lymphs Abs: 1.7 10*3/uL (ref 0.7–4.0)
MCH: 29 pg (ref 26.0–34.0)
MCHC: 34.1 g/dL (ref 30.0–36.0)
MCV: 85.2 fL (ref 80.0–100.0)
Monocytes Absolute: 0.6 10*3/uL (ref 0.1–1.0)
Monocytes Relative: 4 %
Neutro Abs: 12.5 10*3/uL — ABNORMAL HIGH (ref 1.7–7.7)
Neutrophils Relative %: 84 %
Platelet Count: 280 10*3/uL (ref 150–400)
RBC: 4.86 MIL/uL (ref 3.87–5.11)
RDW: 12.2 % (ref 11.5–15.5)
WBC Count: 14.9 10*3/uL — ABNORMAL HIGH (ref 4.0–10.5)
nRBC: 0 % (ref 0.0–0.2)

## 2022-10-15 LAB — CMP (CANCER CENTER ONLY)
ALT: 13 U/L (ref 0–44)
AST: 20 U/L (ref 15–41)
Albumin: 4.3 g/dL (ref 3.5–5.0)
Alkaline Phosphatase: 70 U/L (ref 38–126)
Anion gap: 10 (ref 5–15)
BUN: 17 mg/dL (ref 6–20)
CO2: 23 mmol/L (ref 22–32)
Calcium: 9.7 mg/dL (ref 8.9–10.3)
Chloride: 104 mmol/L (ref 98–111)
Creatinine: 0.84 mg/dL (ref 0.44–1.00)
GFR, Estimated: >60 mL/min (ref >60)
Glucose, Bld: 143 mg/dL — ABNORMAL HIGH (ref 70–99)
Potassium: 3.7 mmol/L (ref 3.5–5.1)
Sodium: 137 mmol/L (ref 135–145)
Total Bilirubin: 0.8 mg/dL (ref 0.3–1.2)
Total Protein: 8.1 g/dL (ref 6.5–8.1)

## 2022-10-15 LAB — PREGNANCY, URINE: Preg Test, Ur: NEGATIVE

## 2022-10-15 MED ORDER — SODIUM CHLORIDE 0.9 % IV SOLN
600.0000 mg/m2 | Freq: Once | INTRAVENOUS | Status: AC
  Administered 2022-10-15: 1000 mg via INTRAVENOUS
  Filled 2022-10-15: qty 50

## 2022-10-15 MED ORDER — SODIUM CHLORIDE 0.9 % IV SOLN
10.0000 mg | Freq: Once | INTRAVENOUS | Status: AC
  Administered 2022-10-15: 10 mg via INTRAVENOUS
  Filled 2022-10-15: qty 10

## 2022-10-15 MED ORDER — SODIUM CHLORIDE 0.9 % IV SOLN: Freq: Once | INTRAVENOUS | Status: AC

## 2022-10-15 MED ORDER — PALONOSETRON HCL INJECTION 0.25 MG/5ML
0.2500 mg | Freq: Once | INTRAVENOUS | Status: AC
  Administered 2022-10-15: 0.25 mg via INTRAVENOUS
  Filled 2022-10-15: qty 5

## 2022-10-15 MED ORDER — SODIUM CHLORIDE 0.9 % IV SOLN
75.0000 mg/m2 | Freq: Once | INTRAVENOUS | Status: AC
  Administered 2022-10-15: 133 mg via INTRAVENOUS
  Filled 2022-10-15: qty 13.3

## 2022-10-15 NOTE — Assessment & Plan Note (Signed)
Sara Browning is a 47 year old woman with stage Ib ER/PR positive left breast cancer status postlumpectomy here today for follow-up and evaluation prior to beginning adjuvant chemotherapy.  Treatment Plan:  Breast conserving surgery and lymph node biopsy Oncotype Dx Adjuvant chemotherapy with Taxotere and Cytoxan x 4 cycles Adjuvant radiation Antiestrogen therapy Consider Verzenio  _________________________________________ Current treatment: adjuvant chemotherapy   Left breast cancer: She had elevated Oncotype and was recommended chemotherapy.  She did not qualify for offset trial so she will proceed with Taxotere and Cytoxan today.  She understands risks and benefits and is willing to proceed.  She understands how to take her antinausea medicines.  Chronic viral hepatitis: She has been taking Vemlidy for 2 weeks as prescribed by infectious disease.  She will follow-up with them in July.  This has not yet been scheduled so I reached out to the infectious disease nurse practitioner Marcos Eke to inquire.  Sara Browning is otherwise doing well.  She will return in 1 week for labs and follow-up.

## 2022-10-15 NOTE — Progress Notes (Signed)
Brief nutrition visit with patient during infusion.  Patient is a 47 year old female diagnosed with breast cancer receiving Taxotere/Cytoxan.  Patient denies issues with oral intake or recent weight loss.  She has some questions regarding her vitamins and minerals combined with treatment.  She has spoken with a member of the pharmacy staff who has given her information.  I also provided evidence-based information about vitamin mineral supplementation during treatments.  Provided my contact information for questions or concerns.

## 2022-10-15 NOTE — Progress Notes (Signed)
[facility] Cancer Follow up:    Sara Morales, NP [address] [place] [address]   DIAGNOSIS:  Cancer Staging  Malignant neoplasm of upper-outer quadrant of left breast in female, estrogen receptor positive (HCC) Staging form: Breast, AJCC 8th Edition - Clinical: Stage IIA (cT2, cN0, cM0, G3, ER+, PR+, HER2-) - Signed by [name], MD on 07/09/2022 Histologic grading system: 3 grade system - Pathologic: Stage IB (pT2, pN0, cM0, G3, ER+, PR+, HER2-) - Signed by [name], NP on 10/15/2022 Histologic grading system: 3 grade system   SUMMARY OF ONCOLOGIC HISTORY: Oncology History  Malignant neoplasm of upper-outer quadrant of left breast in female, estrogen receptor positive (HCC)  07/09/2022 Initial Diagnosis   Malignant neoplasm of upper-outer quadrant of left breast in female, estrogen receptor positive (HCC)   07/09/2022 Cancer Staging   Staging form: Breast, AJCC 8th Edition - Clinical: Stage IIA (cT2, cN0, cM0, G3, ER+, PR+, HER2-) - Signed by [name], MD on 07/09/2022 Histologic grading system: 3 grade system    Genetic Testing   [facility] Multi-Cancer Panel+RNA was Negative. Of note, a variant of uncertain significance was detected in the HOXB13 gene (c.208C>T) and MUTYH gene (c.1547C>T). Report date is 07/24/2022.  The Multi-Cancer + RNA Panel offered by [facility] includes sequencing and/or deletion/duplication analysis of the following 70 genes:  AIP*, ALK, APC*, ATM*, AXIN2*, BAP1*, BARD1*, BLM*, BMPR1A*, BRCA1*, BRCA2*, BRIP1*, CDC73*, CDH1*, CDK4, CDKN1B*, CDKN2A, CHEK2*, CTNNA1*, DICER1*, EPCAM (del/dup only), EGFR, FH*, FLCN*, GREM1 (promoter dup only), HOXB13, KIT, LZTR1, MAX*, MBD4, MEN1*, MET, MITF, MLH1*, MSH2*, MSH3*, MSH6*, MUTYH*, NF1*, NF2*, NTHL1*, PALB2*, PDGFRA, PMS2*, POLD1*, POLE*, POT1*, PRKAR1A*, PTCH1*, PTEN*, RAD51C*, RAD51D*, RB1*, RET, SDHA* (sequencing only), SDHAF2*, SDHB*, SDHC*, SDHD*, SMAD4*, SMARCA4*, SMARCB1*,  SMARCE1*, STK11*, SUFU*, TMEM127*, TP53*, TSC1*, TSC2*, VHL*. RNA analysis is performed for * genes.   08/01/2022 Surgery   Left breast Lumpectomy: IDC, 3.1cm, grade 3, margins negative, no LVI, ER/PR+, Ki-67 70%, HER2-.  9 SLN negative.   08/01/2022 Oncotype testing   24/10%, indicating approximate 6.5% CT benefit   10/15/2022 -  Chemotherapy   Patient is on Treatment Plan : BREAST TC q21d       CURRENT THERAPY: Taxotere/Cytoxan  INTERVAL HISTORY: Sara Browning 47 y.o. female returns for evaluation of her estrogen positive breast cancer prior to receiving chemotherapy with Taxotere and Cytoxan.  She has chronic hepatitis B and was seen by infectious disease who recommended treatment with Vemlidy 25mg  po daily that started on 10/02/2022 and is recommended to continue during chemotherapy and shortly thereafter.  She confirms that she has been taking this medication daily with good tolerance.  She has no questions about her upcoming treatment and denies any new issues.  She opted to forego port placement and instead will have a peripheral IV.  She has picked up her antinausea medicines and has no questions on how to take them.   Patient Active Problem List   Diagnosis Date Noted   Chronic viral hepatitis B without delta agent and without coma (HCC) 09/25/2022   Genetic testing 07/25/2022   Malignant neoplasm of upper-outer quadrant of left breast in female, estrogen receptor positive (HCC) 07/09/2022    has No Known Allergies.  MEDICAL HISTORY: Past Medical History:  Diagnosis Date   Cancer (HCC) 2024   Left Breat Cancer   Hepatitis    Hep B Carrier   Hepatitis 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)    Pneumonia    Hx   Urticaria  SURGICAL HISTORY: Past Surgical History:  Procedure Laterality Date   BREAST BIOPSY Left 07/30/2022   [place] LT RADIOACTIVE SEED LOC 07/30/2022 [facility]AMMOGRAP[facility]   BREAST LUMPECTOMY WITH RADIOACTIVE SEED AND SENTINEL LYMPH NODE BIOPSY Left 08/01/2022   Procedure: LEFT  BREAST LUMPECTOMY WITH RADIOACTIVE SEED AND SENTINEL LYMPH NODE BIOPSY;  Surgeon: Harriette Bouillon, MD;  Location: [facility] OR;  Service: General;  Laterality: Left;   CESAREAN SECTION     twice    SOCIAL HISTORY: Social History   Socioeconomic History   Marital status: Married    Spouse name: Not on file   Number of children: Not on file   Years of education: Not on file   Highest education level: Not on file  Occupational History   Not on file  Tobacco Use   Smoking status: Never   Smokeless tobacco: Never   Tobacco comments:    Drinks seldom  Vaping Use   Vaping Use: Never used  Substance and Sexual Activity   Alcohol use: Not Currently    Comment: occasionally   Drug use: Never   Sexual activity: Yes  Other Topics Concern   Not on file  Social History Narrative   Not on file   Social Determinants of Health   Financial Resource Strain: Not on file  Food Insecurity: No Food Insecurity (07/19/2022)   Hunger Vital Sign    Worried About Running Out of Food in the Last Year: Never true    Ran Out of Food in the Last Year: Never true  Transportation Needs: No Transportation Needs (07/19/2022)   PRAPARE - Administrator, Civil Service (Medical): No    Lack of Transportation (Non-Medical): No  Physical Activity: Not on file  Stress: Not on file  Social Connections: Not on file  Intimate Partner Violence: Not on file    FAMILY HISTORY: Family History  Problem Relation Age of Onset   Liver cancer Maternal Aunt    Breast cancer Maternal Grandmother    Hypertension Paternal Grandmother    Diabetes Paternal Grandfather     Review of Systems  Constitutional:  Negative for appetite change, chills, fatigue, fever and unexpected weight change.  HENT:   Negative for hearing loss, lump/mass and trouble swallowing.   Eyes:  Negative for eye problems and icterus.  Respiratory:  Negative for chest tightness, cough and shortness of breath.   Cardiovascular:  Negative for  chest pain, leg swelling and palpitations.  Gastrointestinal:  Negative for abdominal distention, abdominal pain, constipation, diarrhea, nausea and vomiting.  Endocrine: Negative for hot flashes.  Genitourinary:  Negative for difficulty urinating.   Musculoskeletal:  Negative for arthralgias.  Skin:  Negative for itching and rash.  Neurological:  Negative for dizziness, extremity weakness, headaches and numbness.  Hematological:  Negative for adenopathy. Does not bruise/bleed easily.  Psychiatric/Behavioral:  Negative for depression. The patient is not nervous/anxious.       PHYSICAL EXAMINATION   Onc Performance Status - 10/15/22 0900       KPS SCALE   KPS % SCORE Able to carry on normal activity, minor s/s of disease             Vitals:   10/15/22 0950  BP: 135/61  Pulse: 84  Resp: 16  Temp: 98.2 F (36.8 C)  SpO2: 98%    Physical Exam Constitutional:      General: She is not in acute distress.    Appearance: Normal appearance. She is not toxic-appearing.  HENT:  Head: Normocephalic and atraumatic.     Mouth/Throat:     Mouth: Mucous membranes are moist.     Pharynx: Oropharynx is clear. No oropharyngeal exudate or posterior oropharyngeal erythema.  Eyes:     General: No scleral icterus. Cardiovascular:     Rate and Rhythm: Normal rate and regular rhythm.     Pulses: Normal pulses.     Heart sounds: Normal heart sounds.  Pulmonary:     Effort: Pulmonary effort is normal.     Breath sounds: Normal breath sounds.  Chest:     Comments: Left breast lumpectomy and left axillary incision sites both clean dry and intact well-healed no sign of infection.  No axillary lymphadenopathy is noted. Abdominal:     General: Abdomen is flat. Bowel sounds are normal. There is no distension.     Palpations: Abdomen is soft.     Tenderness: There is no abdominal tenderness.  Musculoskeletal:        General: No swelling.     Cervical back: Neck supple.   Lymphadenopathy:     Cervical: No cervical adenopathy.  Skin:    General: Skin is warm and dry.     Findings: No rash.  Neurological:     General: No focal deficit present.     Mental Status: She is alert.  Psychiatric:        Mood and Affect: Mood normal.        Behavior: Behavior normal.     LABORATORY DATA:  CBC    Component Value Date/Time   WBC 14.9 (H) 10/15/2022 0924   WBC 7.0 07/31/2022 0925   RBC 4.86 10/15/2022 0924   HGB 14.1 10/15/2022 0924   HGB 14.9 03/28/2020 1118   HCT 41.4 10/15/2022 0924   HCT 43.6 03/28/2020 1118   PLT 280 10/15/2022 0924   PLT 257 03/28/2020 1118   MCV 85.2 10/15/2022 0924   MCV 84 03/28/2020 1118   MCH 29.0 10/15/2022 0924   MCHC 34.1 10/15/2022 0924   RDW 12.2 10/15/2022 0924   RDW 11.9 03/28/2020 1118   LYMPHSABS 1.7 10/15/2022 0924   LYMPHSABS 2.5 03/28/2020 1118   MONOABS 0.6 10/15/2022 0924   EOSABS 0.0 10/15/2022 0924   EOSABS 0.1 03/28/2020 1118   BASOSABS 0.0 10/15/2022 0924   BASOSABS 0.0 03/28/2020 1118    CMP     Component Value Date/Time   NA 137 10/15/2022 0924   NA 136 03/28/2020 1118   K 3.7 10/15/2022 0924   CL 104 10/15/2022 0924   CO2 23 10/15/2022 0924   GLUCOSE 143 (H) 10/15/2022 0924   BUN 17 10/15/2022 0924   BUN 11 03/28/2020 1118   CREATININE 0.84 10/15/2022 0924   CALCIUM 9.7 10/15/2022 0924   PROT 8.1 10/15/2022 0924   PROT 7.5 03/28/2020 1118   ALBUMIN 4.3 10/15/2022 0924   ALBUMIN 4.3 03/28/2020 1118   AST 20 10/15/2022 0924   ALT 13 10/15/2022 0924   ALT 12 09/24/2022 1523   ALKPHOS 70 10/15/2022 0924   BILITOT 0.8 10/15/2022 0924   GFRNONAA >60 10/15/2022 0924   GFRAA 102 03/28/2020 1118         ASSESSMENT and THERAPY PLAN:   Malignant neoplasm of upper-outer quadrant of left breast in female, estrogen receptor positive (HCC) Elnita Maxwell is a 47 year old woman with stage Ib ER/PR positive left breast cancer status postlumpectomy here today for follow-up and evaluation prior to  beginning adjuvant chemotherapy.  Treatment Plan:  Breast conserving surgery and lymph  node biopsy Oncotype Dx Adjuvant chemotherapy with Taxotere and Cytoxan x 4 cycles Adjuvant radiation Antiestrogen therapy Consider Verzenio  _________________________________________ Current treatment: adjuvant chemotherapy   Left breast cancer: She had elevated Oncotype and was recommended chemotherapy.  She did not qualify for offset trial so she will proceed with Taxotere and Cytoxan today.  She understands risks and benefits and is willing to proceed.  She understands how to take her antinausea medicines.  Chronic viral hepatitis: She has been taking Vemlidy for 2 weeks as prescribed by infectious disease.  She will follow-up with them in July.  This has not yet been scheduled so I reached out to the infectious disease nurse practitioner Marcos Eke to inquire.  Elnita Maxwell is otherwise doing well.  She will return in 1 week for labs and follow-up.   All questions were answered. The patient knows to call the clinic with any problems, questions or concerns. We can certainly see the patient much sooner if necessary.  Total encounter time:30 minutes*in face-to-face visit time, chart review, lab review, care coordination, order entry, and documentation of the encounter time.    Lillard Anes, NP 10/15/22 11:20 AM Medical Oncology and Hematology [facility] [address] [place], [place] [address] Tel. [phone]    Fax. [phone]  *Total Encounter Time as defined by the Centers for Medicare and Medicaid Services includes, in addition to the face-to-face time of a patient visit (documented in the note above) non-face-to-face time: obtaining and reviewing outside history, ordering and reviewing medications, tests or procedures, care coordination (communications with other health care professionals or caregivers) and documentation in the medical record.

## 2022-10-15 NOTE — Telephone Encounter (Signed)
Scheduled appointments per 6/25 los. Patient is aware of the made appointments. 

## 2022-10-15 NOTE — Research (Signed)
Trial: [id number]: Use of a Clinical Trial Screening Tool to Address Cancer Health Disparities in the [employer] [facility] Research Program [facility])    Patient Sara Browning was identified by Dr. Al Pimple as a potential candidate for the above listed study.  This Clinical Research Nurse met with [name], [id number], on 10/15/22 in a manner and location that ensures patient privacy to discuss participation in the above listed research study.  Patient is Accompanied by her husband .  A copy of the informed consent document and separate HIPAA Authorization was provided to the patient.  Patient reads, speaks, and understands [place].    Patient was provided with the business card of this Nurse and encouraged to contact the research team with any questions.  Patient was provided the option of taking informed consent documents home to review and was encouraged to review at their convenience with their support network, including other care providers. Patient is comfortable with making a decision regarding study participation today.  As outlined in the informed consent form, this Nurse and [name] discussed the purpose of the research study, the investigational nature of the study, study procedures and requirements for study participation, potential risks and benefits of study participation, as well as alternatives to participation. This study is not blinded. The patient understands participation is voluntary and they may withdraw from study participation at any time.  This study does not involve randomization.  This study does not involve an investigational drug or device. This study does not involve a placebo. Patient understands enrollment is pending full eligibility review.   Confidentiality and how the patient's information will be used as part of study participation were discussed.  Patient was informed there is not reimbursement provided for their time and effort spent on trial  participation.  The patient is encouraged to discuss research study participation with their insurance provider to determine what costs they may incur as part of study participation, including research related injury.    All questions were answered to patient's satisfaction.  The informed consent and separate HIPAA Authorization was reviewed page by page.  The patient's mental and emotional status is appropriate to provide informed consent, and the patient verbalizes an understanding of study participation.  Patient has agreed to participate in the above listed research study and has voluntarily signed the informed consent protocol version date 05/21/21 [place] active 12/07/21 with separate HIPAA version 15, IRB approved 09/30/22  on 10/15/22 at 2:45PM.  The patient was provided with a copy of the signed informed consent form and separate HIPAA Authorization for their reference.  No study specific procedures were obtained prior to the signing of the informed consent document.  Approximately 15 minutes were spent with the patient reviewing the informed consent documents.  Patient was not requested to complete a Release of Information form.    This Nurse has reviewed this patient's inclusion and exclusion criteria and confirmed Sara Browning is eligible for study participation.  Patient will continue with enrollment. Patient was interviewed to collect demographic information for this study.  Thanked patient for her participation.  Domenica Reamer, BSN, RN, [employer] Research Nurse II [phone] 10/15/2022 3:16 PM

## 2022-10-16 ENCOUNTER — Encounter: Payer: Self-pay | Admitting: Hematology and Oncology

## 2022-10-17 ENCOUNTER — Inpatient Hospital Stay: Payer: BC Managed Care – PPO

## 2022-10-17 ENCOUNTER — Other Ambulatory Visit: Payer: Self-pay

## 2022-10-17 VITALS — BP 110/59 | HR 66 | Temp 98.4°F | Resp 16

## 2022-10-17 DIAGNOSIS — Z5189 Encounter for other specified aftercare: Secondary | ICD-10-CM | POA: Diagnosis not present

## 2022-10-17 DIAGNOSIS — Z170 Estrogen receptor positive status [ER+]: Secondary | ICD-10-CM | POA: Diagnosis not present

## 2022-10-17 DIAGNOSIS — Z800 Family history of malignant neoplasm of digestive organs: Secondary | ICD-10-CM | POA: Diagnosis not present

## 2022-10-17 DIAGNOSIS — B181 Chronic viral hepatitis B without delta-agent: Secondary | ICD-10-CM | POA: Diagnosis not present

## 2022-10-17 DIAGNOSIS — Z803 Family history of malignant neoplasm of breast: Secondary | ICD-10-CM | POA: Diagnosis not present

## 2022-10-17 DIAGNOSIS — Z5111 Encounter for antineoplastic chemotherapy: Secondary | ICD-10-CM | POA: Diagnosis not present

## 2022-10-17 DIAGNOSIS — C50412 Malignant neoplasm of upper-outer quadrant of left female breast: Secondary | ICD-10-CM | POA: Diagnosis not present

## 2022-10-17 MED ORDER — PEGFILGRASTIM-CBQV 6 MG/0.6ML ~~LOC~~ SOSY
6.0000 mg | PREFILLED_SYRINGE | Freq: Once | SUBCUTANEOUS | Status: AC
  Administered 2022-10-17: 6 mg via SUBCUTANEOUS
  Filled 2022-10-17: qty 0.6

## 2022-10-17 NOTE — Patient Instructions (Signed)

## 2022-10-18 ENCOUNTER — Encounter: Payer: Self-pay | Admitting: Hematology and Oncology

## 2022-10-18 DIAGNOSIS — Z20822 Contact with and (suspected) exposure to covid-19: Secondary | ICD-10-CM | POA: Diagnosis not present

## 2022-10-18 DIAGNOSIS — E871 Hypo-osmolality and hyponatremia: Secondary | ICD-10-CM | POA: Diagnosis not present

## 2022-10-18 DIAGNOSIS — C50919 Malignant neoplasm of unspecified site of unspecified female breast: Secondary | ICD-10-CM | POA: Diagnosis not present

## 2022-10-18 DIAGNOSIS — M545 Low back pain, unspecified: Secondary | ICD-10-CM | POA: Diagnosis not present

## 2022-10-18 DIAGNOSIS — R112 Nausea with vomiting, unspecified: Secondary | ICD-10-CM | POA: Diagnosis not present

## 2022-10-18 DIAGNOSIS — R531 Weakness: Secondary | ICD-10-CM | POA: Diagnosis not present

## 2022-10-18 DIAGNOSIS — E860 Dehydration: Secondary | ICD-10-CM | POA: Diagnosis not present

## 2022-10-18 DIAGNOSIS — D72829 Elevated white blood cell count, unspecified: Secondary | ICD-10-CM | POA: Diagnosis not present

## 2022-10-18 DIAGNOSIS — B370 Candidal stomatitis: Secondary | ICD-10-CM | POA: Diagnosis not present

## 2022-10-18 DIAGNOSIS — Z79899 Other long term (current) drug therapy: Secondary | ICD-10-CM | POA: Diagnosis not present

## 2022-10-18 DIAGNOSIS — M79604 Pain in right leg: Secondary | ICD-10-CM | POA: Diagnosis not present

## 2022-10-18 DIAGNOSIS — M79605 Pain in left leg: Secondary | ICD-10-CM | POA: Diagnosis not present

## 2022-10-18 NOTE — Telephone Encounter (Signed)
Called pt to see how she did with her treatment & she reports some nausea, some constipation.  She has taken zofran today before eating & that helped.  Informed that she can take a stool softener if needed.  She has had some aches with the Pegfilgrastim.  Informed that she could take tylenol if needed & instructed to take tylenol with claritin before her shot.  She was told not to take tylenol b/c it would mask a fever.  Informed OK to take sparingly if needed for mild pain or for this reason.  She expressed understanding.  She reports knowing how to reach [place] if needed & Knows her next appts.

## 2022-10-18 NOTE — Telephone Encounter (Signed)
-----   Message from Delight Stare, RN sent at 10/15/2022  3:23 PM EDT ----- Regarding: 1st time Taxotere/Cytoxan, Dr. Al Pimple patient Hello, pt tolerated 1st time Taxotere/Cytoxan treatment well. Please check on her tomorrow. Thank you

## 2022-10-19 DIAGNOSIS — E871 Hypo-osmolality and hyponatremia: Secondary | ICD-10-CM | POA: Diagnosis not present

## 2022-10-19 DIAGNOSIS — E860 Dehydration: Secondary | ICD-10-CM | POA: Diagnosis not present

## 2022-10-19 DIAGNOSIS — C50919 Malignant neoplasm of unspecified site of unspecified female breast: Secondary | ICD-10-CM | POA: Diagnosis not present

## 2022-10-21 ENCOUNTER — Other Ambulatory Visit: Payer: Self-pay

## 2022-10-22 ENCOUNTER — Encounter: Payer: Self-pay | Admitting: Adult Health

## 2022-10-22 ENCOUNTER — Other Ambulatory Visit: Payer: Self-pay

## 2022-10-22 ENCOUNTER — Inpatient Hospital Stay (HOSPITAL_BASED_OUTPATIENT_CLINIC_OR_DEPARTMENT_OTHER): Payer: BC Managed Care – PPO | Admitting: Adult Health

## 2022-10-22 ENCOUNTER — Ambulatory Visit: Payer: BC Managed Care – PPO

## 2022-10-22 ENCOUNTER — Inpatient Hospital Stay: Payer: BC Managed Care – PPO | Attending: Hematology and Oncology

## 2022-10-22 ENCOUNTER — Other Ambulatory Visit: Payer: Self-pay | Admitting: *Deleted

## 2022-10-22 VITALS — BP 107/67 | HR 103 | Temp 97.7°F | Resp 17 | Wt 152.4 lb

## 2022-10-22 DIAGNOSIS — K1230 Oral mucositis (ulcerative), unspecified: Secondary | ICD-10-CM | POA: Insufficient documentation

## 2022-10-22 DIAGNOSIS — C50412 Malignant neoplasm of upper-outer quadrant of left female breast: Secondary | ICD-10-CM

## 2022-10-22 DIAGNOSIS — R5383 Other fatigue: Secondary | ICD-10-CM | POA: Diagnosis not present

## 2022-10-22 DIAGNOSIS — Z5111 Encounter for antineoplastic chemotherapy: Secondary | ICD-10-CM | POA: Diagnosis not present

## 2022-10-22 DIAGNOSIS — Z5189 Encounter for other specified aftercare: Secondary | ICD-10-CM | POA: Insufficient documentation

## 2022-10-22 DIAGNOSIS — K5900 Constipation, unspecified: Secondary | ICD-10-CM | POA: Diagnosis not present

## 2022-10-22 DIAGNOSIS — Z170 Estrogen receptor positive status [ER+]: Secondary | ICD-10-CM | POA: Insufficient documentation

## 2022-10-22 DIAGNOSIS — B181 Chronic viral hepatitis B without delta-agent: Secondary | ICD-10-CM | POA: Insufficient documentation

## 2022-10-22 DIAGNOSIS — E860 Dehydration: Secondary | ICD-10-CM | POA: Diagnosis not present

## 2022-10-22 LAB — CMP (CANCER CENTER ONLY)
ALT: 46 U/L — ABNORMAL HIGH (ref 0–44)
AST: 62 U/L — ABNORMAL HIGH (ref 15–41)
Albumin: 3.7 g/dL (ref 3.5–5.0)
Alkaline Phosphatase: 82 U/L (ref 38–126)
Anion gap: 7 (ref 5–15)
BUN: 12 mg/dL (ref 6–20)
CO2: 30 mmol/L (ref 22–32)
Calcium: 9.8 mg/dL (ref 8.9–10.3)
Chloride: 97 mmol/L — ABNORMAL LOW (ref 98–111)
Creatinine: 0.94 mg/dL (ref 0.44–1.00)
GFR, Estimated: >60 mL/min (ref >60)
Glucose, Bld: 111 mg/dL — ABNORMAL HIGH (ref 70–99)
Potassium: 4.2 mmol/L (ref 3.5–5.1)
Sodium: 134 mmol/L — ABNORMAL LOW (ref 135–145)
Total Bilirubin: 0.5 mg/dL (ref 0.3–1.2)
Total Protein: 6.8 g/dL (ref 6.5–8.1)

## 2022-10-22 LAB — CBC WITH DIFFERENTIAL (CANCER CENTER ONLY)
Abs Immature Granulocytes: 1 10*3/uL — ABNORMAL HIGH (ref 0.00–0.07)
Basophils Absolute: 0.1 10*3/uL (ref 0.0–0.1)
Basophils Relative: 1 %
Blasts: 1 %
Eosinophils Absolute: 0 10*3/uL (ref 0.0–0.5)
Eosinophils Relative: 0 %
HCT: 41.3 % (ref 36.0–46.0)
Hemoglobin: 14.2 g/dL (ref 12.0–15.0)
Lymphocytes Relative: 44 %
Lymphs Abs: 3 10*3/uL (ref 0.7–4.0)
MCH: 29.2 pg (ref 26.0–34.0)
MCHC: 34.4 g/dL (ref 30.0–36.0)
MCV: 85 fL (ref 80.0–100.0)
Metamyelocytes Relative: 2 %
Monocytes Absolute: 1.4 10*3/uL — ABNORMAL HIGH (ref 0.1–1.0)
Monocytes Relative: 21 %
Myelocytes: 13 %
Neutro Abs: 1.2 10*3/uL — ABNORMAL LOW (ref 1.7–7.7)
Neutrophils Relative %: 18 %
Platelet Count: 149 10*3/uL — ABNORMAL LOW (ref 150–400)
RBC: 4.86 MIL/uL (ref 3.87–5.11)
RDW: 11.6 % (ref 11.5–15.5)
WBC Count: 6.9 10*3/uL (ref 4.0–10.5)
nRBC: 0 % (ref 0.0–0.2)

## 2022-10-22 MED ORDER — DEXAMETHASONE 4 MG PO TABS: ORAL_TABLET | ORAL | 1 refills | Status: DC

## 2022-10-22 MED ORDER — SODIUM CHLORIDE 0.9 % IV SOLN: INTRAVENOUS | Status: AC

## 2022-10-22 NOTE — Progress Notes (Signed)
[name] Cancer Follow up:    [name], NP [address] [place] [address]   DIAGNOSIS:  Cancer Staging  Malignant neoplasm of upper-outer quadrant of left breast in female, estrogen receptor positive (HCC) Staging form: Breast, AJCC 8th Edition - Clinical: Stage IIA (cT2, cN0, cM0, G3, ER+, PR+, HER2-) - Signed by [name], MD on 07/09/2022 Histologic grading system: 3 grade system - Pathologic: Stage IB (pT2, pN0, cM0, G3, ER+, PR+, HER2-) - Signed by [name], NP on 10/15/2022 Histologic grading system: 3 grade system   SUMMARY OF ONCOLOGIC HISTORY: Oncology History  Malignant neoplasm of upper-outer quadrant of left breast in female, estrogen receptor positive (HCC)  07/09/2022 Initial Diagnosis   Malignant neoplasm of upper-outer quadrant of left breast in female, estrogen receptor positive (HCC)   07/09/2022 Cancer Staging   Staging form: Breast, AJCC 8th Edition - Clinical: Stage IIA (cT2, cN0, cM0, G3, ER+, PR+, HER2-) - Signed by [name], MD on 07/09/2022 Histologic grading system: 3 grade system    Genetic Testing   [facility] Multi-Cancer Panel+RNA was Negative. Of note, a variant of uncertain significance was detected in the HOXB13 gene (c.208C>T) and MUTYH gene (c.1547C>T). Report date is 07/24/2022.  The Multi-Cancer + RNA Panel offered by [facility] includes sequencing and/or deletion/duplication analysis of the following 70 genes:  AIP*, ALK, APC*, ATM*, AXIN2*, BAP1*, BARD1*, BLM*, BMPR1A*, BRCA1*, BRCA2*, BRIP1*, CDC73*, CDH1*, CDK4, CDKN1B*, CDKN2A, CHEK2*, CTNNA1*, DICER1*, EPCAM (del/dup only), EGFR, FH*, FLCN*, GREM1 (promoter dup only), HOXB13, KIT, LZTR1, MAX*, MBD4, MEN1*, MET, MITF, MLH1*, MSH2*, MSH3*, MSH6*, MUTYH*, NF1*, NF2*, NTHL1*, PALB2*, PDGFRA, PMS2*, POLD1*, POLE*, POT1*, PRKAR1A*, PTCH1*, PTEN*, RAD51C*, RAD51D*, RB1*, RET, SDHA* (sequencing only), SDHAF2*, SDHB*, SDHC*, SDHD*, SMAD4*, SMARCA4*, SMARCB1*,  SMARCE1*, STK11*, SUFU*, TMEM127*, TP53*, TSC1*, TSC2*, VHL*. RNA analysis is performed for * genes.   08/01/2022 Surgery   Left breast Lumpectomy: IDC, 3.1cm, grade 3, margins negative, no LVI, ER/PR+, Ki-67 70%, HER2-.  9 SLN negative.   08/01/2022 Oncotype testing   24/10%, indicating approximate 6.5% CT benefit   10/15/2022 -  Chemotherapy   Patient is on Treatment Plan : BREAST TC q21d     10/15/2022 Cancer Staging   Staging form: Breast, AJCC 8th Edition - Pathologic: Stage IB (pT2, pN0, cM0, G3, ER+, PR+, HER2-) - Signed by [name], NP on 10/15/2022 Histologic grading system: 3 grade system     CURRENT THERAPY: Taxotere/Cytoxan  INTERVAL HISTORY: Sara Browning 47 y.o. female returns for f/u after receiving her first cycle of Taxotere/Cytoxan.  She received treatment on Tuesday, June 25, receivied udenyca on Thursday June 27 and then went to the ER Friday evening, June 28 due to   She developed increased body pain and received IV fluids and IV pain medication which helped.  She also received a laxative and magic mouthwash.  She has not yet picked this up.  She went to [facility].    Today she is fatigued.  She felt this worsened once she stopped the Dexamethasone.  She also has aching in her body and is taking tylenol and claritin which helps.  She feels slightly dehydrated.  She has mild intermittent numbness and tingling in her fingertips and toes that began after receiving her treatment.     Patient Active Problem Browning   Diagnosis Date Noted   Chronic viral hepatitis B without delta agent and without coma (HCC) 09/25/2022   Genetic testing 07/25/2022   Malignant neoplasm of  upper-outer quadrant of left breast in female, estrogen receptor positive (HCC) 07/09/2022    has No Known Allergies.  MEDICAL HISTORY: Past Medical History:  Diagnosis Date   Cancer (HCC) 2024   Left Breat Cancer   Hepatitis    Hep B Carrier   Hepatitis 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)     Pneumonia    Hx   Urticaria     SURGICAL HISTORY: Past Surgical History:  Procedure Laterality Date   BREAST BIOPSY Left 07/30/2022   [place] LT RADIOACTIVE SEED LOC 07/30/2022 [facility] MAMMOGRAPHY   BREAST LUMPECTOMY WITH RADIOACTIVE SEED AND SENTINEL LYMPH NODE BIOPSY Left 08/01/2022   Procedure: LEFT BREAST LUMPECTOMY WITH RADIOACTIVE SEED AND SENTINEL LYMPH NODE BIOPSY;  Surgeon: Harriette Bouillon, MD;  Location: [facility] OR;  Service: General;  Laterality: Left;   CESAREAN SECTION     twice    SOCIAL HISTORY: Social History   Socioeconomic History   Marital status: Married    Spouse name: Not on file   Number of children: Not on file   Years of education: Not on file   Highest education level: Not on file  Occupational History   Not on file  Tobacco Use   Smoking status: Never   Smokeless tobacco: Never   Tobacco comments:    Drinks seldom  Vaping Use   Vaping Use: Never used  Substance and Sexual Activity   Alcohol use: Not Currently    Comment: occasionally   Drug use: Never   Sexual activity: Yes  Other Topics Concern   Not on file  Social History Narrative   Not on file   Social Determinants of Health   Financial Resource Strain: Not on file  Food Insecurity: No Food Insecurity (07/19/2022)   Hunger Vital Sign    Worried About Running Out of Food in the Last Year: Never true    Ran Out of Food in the Last Year: Never true  Transportation Needs: No Transportation Needs (07/19/2022)   PRAPARE - Administrator, Civil Service (Medical): No    Lack of Transportation (Non-Medical): No  Physical Activity: Not on file  Stress: Not on file  Social Connections: Not on file  Intimate Partner Violence: Not on file    FAMILY HISTORY: Family History  Problem Relation Age of Onset   Liver cancer Maternal Aunt    Breast cancer Maternal Grandmother    Hypertension Paternal Grandmother    Diabetes Paternal Grandfather     Review of Systems - Oncology     PHYSICAL EXAMINATION   Onc Performance Status - 10/22/22 1100       KPS SCALE   KPS % SCORE Cares for self, unable to carry on normal activity or to do active work             Vitals:   10/22/22 1117  BP: 107/67  Pulse: (!) 103  Resp: 17  Temp: 97.7 F (36.5 C)  SpO2: 97%    Physical Exam Constitutional:      General: She is not in acute distress.    Appearance: Normal appearance. She is ill-appearing (patient appears very tired). She is not toxic-appearing.  HENT:     Head: Normocephalic and atraumatic.     Mouth/Throat:     Mouth: Mucous membranes are moist.     Pharynx: Oropharynx is clear. Posterior oropharyngeal erythema present. No oropharyngeal exudate.  Eyes:     General: No scleral icterus. Cardiovascular:     Rate and Rhythm: Regular  rhythm. Tachycardia present.     Pulses: Normal pulses.     Heart sounds: Normal heart sounds.  Pulmonary:     Effort: Pulmonary effort is normal.     Breath sounds: Normal breath sounds.  Abdominal:     General: Abdomen is flat. Bowel sounds are normal. There is no distension.     Palpations: Abdomen is soft.     Tenderness: There is no abdominal tenderness.  Musculoskeletal:        General: No swelling.     Cervical back: Neck supple.  Lymphadenopathy:     Cervical: No cervical adenopathy.  Skin:    General: Skin is warm and dry.     Findings: No rash.  Neurological:     General: No focal deficit present.     Mental Status: She is alert.  Psychiatric:        Mood and Affect: Mood normal.        Behavior: Behavior normal.     LABORATORY DATA:  CBC    Component Value Date/Time   WBC 6.9 10/22/2022 1102   WBC 7.0 07/31/2022 0925   RBC 4.86 10/22/2022 1102   HGB 14.2 10/22/2022 1102   HGB 14.9 03/28/2020 1118   HCT 41.3 10/22/2022 1102   HCT 43.6 03/28/2020 1118   PLT 149 (L) 10/22/2022 1102   PLT 257 03/28/2020 1118   MCV 85.0 10/22/2022 1102   MCV 84 03/28/2020 1118   MCH 29.2 10/22/2022 1102    MCHC 34.4 10/22/2022 1102   RDW 11.6 10/22/2022 1102   RDW 11.9 03/28/2020 1118   LYMPHSABS 3.0 10/22/2022 1102   LYMPHSABS 2.5 03/28/2020 1118   MONOABS 1.4 (H) 10/22/2022 1102   EOSABS 0.0 10/22/2022 1102   EOSABS 0.1 03/28/2020 1118   BASOSABS 0.1 10/22/2022 1102   BASOSABS 0.0 03/28/2020 1118    CMP     Component Value Date/Time   NA 134 (L) 10/22/2022 1102   NA 136 03/28/2020 1118   K 4.2 10/22/2022 1102   CL 97 (L) 10/22/2022 1102   CO2 30 10/22/2022 1102   GLUCOSE 111 (H) 10/22/2022 1102   BUN 12 10/22/2022 1102   BUN 11 03/28/2020 1118   CREATININE 0.94 10/22/2022 1102   CALCIUM 9.8 10/22/2022 1102   PROT 6.8 10/22/2022 1102   PROT 7.5 03/28/2020 1118   ALBUMIN 3.7 10/22/2022 1102   ALBUMIN 4.3 03/28/2020 1118   AST 62 (H) 10/22/2022 1102   ALT 46 (H) 10/22/2022 1102   ALT 12 09/24/2022 1523   ALKPHOS 82 10/22/2022 1102   BILITOT 0.5 10/22/2022 1102   GFRNONAA >60 10/22/2022 1102   GFRAA 102 03/28/2020 1118         ASSESSMENT and THERAPY PLAN:   Malignant neoplasm of upper-outer quadrant of left breast in female, estrogen receptor positive (HCC) Sara Browning is a 47 year old woman with history of stage IB ER/PR positive breast cancer s/p lumpectomy, now receiving adjuvant chemotherapy with taxotere and cytoxan here today for f/u after receiving her first cycle last week.   Treatment Plan:  Lumpectomy Adjuvant chemotherapy Adjuvant radiation Antiestrogen therapy  Current Treatment: Taxotere and Cytoxan cycle 1 day 8  Dehydration: IV fluids today (ordered as signed and held) and on day 3 with subsequent treatments.  Orders placed within treatment plan and scheduling message sent.   Body pain: This is related to her Udenyca.  Has oxycodone PRN, she hasn't tried this, however I recommended she do so if needed and let  me know if it works for her significant pain.   Mucositis: Magic Mouthwash Constipation: picking up Enulose that was prescribed by the ER  last week, today. Profound fatigue: I encouraged her that this would slowly improve.  With her next treatment we will change her dexamethasone dosing to 2 tab the day before and two days after, and then 1 tab daily x 3 days so the fatigue doesn't hit her as suddenly as it did when she stopped the dexamethasone this past cycle.  I updated the orders and sent in a refill to her pharmacy as well today.    Brizza's CMET is pending.  We will await these results to understand whether dose reduction is needed for her next treatment.     All questions were answered. The patient knows to call the clinic with any problems, questions or concerns. We can certainly see the patient much sooner if necessary.  Total encounter time:30 minutes*in face-to-face visit time, chart review, lab review, care coordination, order entry, and documentation of the encounter time.    Lillard Anes, NP 10/22/22 12:12 PM Medical Oncology and Hematology [facility] [address] [place], [place] [address] Tel. [phone]    Fax. [phone]  *Total Encounter Time as defined by the Centers for Medicare and Medicaid Services includes, in addition to the face-to-face time of a patient visit (documented in the note above) non-face-to-face time: obtaining and reviewing outside history, ordering and reviewing medications, tests or procedures, care coordination (communications with other health care professionals or caregivers) and documentation in the medical record.

## 2022-10-22 NOTE — Assessment & Plan Note (Addendum)
Sara Browning is a 47 year old woman with history of stage IB ER/PR positive breast cancer s/p lumpectomy, now receiving adjuvant chemotherapy with taxotere and cytoxan here today for f/u after receiving her first cycle last week.   Treatment Plan:  Lumpectomy Adjuvant chemotherapy Adjuvant radiation Antiestrogen therapy  Current Treatment: Taxotere and Cytoxan cycle 1 day 8  Dehydration: IV fluids today (ordered as signed and held) and on day 3 with subsequent treatments.  Orders placed within treatment plan and scheduling message sent.   Body pain: This is related to her Udenyca.  Has oxycodone PRN, she hasn't tried this, however I recommended she do so if needed and let me know if it works for her significant pain.   Mucositis: Magic Mouthwash Constipation: picking up Enulose that was prescribed by the ER last week, today. Profound fatigue: I encouraged her that this would slowly improve.  With her next treatment we will change her dexamethasone dosing to 2 tab the day before and two days after, and then 1 tab daily x 3 days so the fatigue doesn't hit her as suddenly as it did when she stopped the dexamethasone this past cycle.  I updated the orders and sent in a refill to her pharmacy as well today.    Sara Browning's CMET is pending.  We will await these results to understand whether dose reduction is needed for her next treatment.

## 2022-10-22 NOTE — Patient Instructions (Signed)

## 2022-10-23 ENCOUNTER — Telehealth: Payer: Self-pay | Admitting: *Deleted

## 2022-10-23 ENCOUNTER — Other Ambulatory Visit: Payer: Self-pay | Admitting: *Deleted

## 2022-10-23 ENCOUNTER — Telehealth: Payer: Self-pay | Admitting: Adult Health

## 2022-10-23 ENCOUNTER — Encounter: Payer: Self-pay | Admitting: Hematology and Oncology

## 2022-10-23 MED ORDER — MAGIC MOUTHWASH W/LIDOCAINE: 5.0000 mL | Freq: Four times a day (QID) | ORAL | 2 refills | Status: DC | PRN

## 2022-10-23 NOTE — Telephone Encounter (Signed)
Pt aware that local pharmacies do not have lidocaine in stock- prescription for MMW sent to [facility] and pt will pick up this Friday.

## 2022-10-23 NOTE — Telephone Encounter (Signed)
Scheduled appointment per los. Patient is aware of the made appointment. 

## 2022-10-25 ENCOUNTER — Other Ambulatory Visit (HOSPITAL_COMMUNITY): Payer: Self-pay

## 2022-10-25 ENCOUNTER — Encounter: Payer: Self-pay | Admitting: Hematology and Oncology

## 2022-10-25 MED ORDER — NYSTATIN 100000 UNIT/ML MT SUSP
OROMUCOSAL | 2 refills | Status: DC
  Filled 2022-10-25: qty 240, 6d supply, fill #0
  Filled 2022-11-22: qty 240, 6d supply, fill #1
  Filled 2022-12-18: qty 240, 6d supply, fill #2

## 2022-10-28 ENCOUNTER — Ambulatory Visit: Payer: BC Managed Care – PPO | Admitting: Family

## 2022-10-28 ENCOUNTER — Other Ambulatory Visit (HOSPITAL_COMMUNITY): Payer: Self-pay

## 2022-11-04 MED FILL — Dexamethasone Sodium Phosphate Inj 100 MG/10ML: INTRAMUSCULAR | Qty: 1 | Status: AC

## 2022-11-05 ENCOUNTER — Other Ambulatory Visit: Payer: Self-pay | Admitting: *Deleted

## 2022-11-05 ENCOUNTER — Inpatient Hospital Stay: Payer: BC Managed Care – PPO

## 2022-11-05 ENCOUNTER — Ambulatory Visit: Payer: BC Managed Care – PPO

## 2022-11-05 ENCOUNTER — Other Ambulatory Visit: Payer: Self-pay

## 2022-11-05 ENCOUNTER — Encounter: Payer: Self-pay | Admitting: Hematology and Oncology

## 2022-11-05 ENCOUNTER — Inpatient Hospital Stay: Payer: BC Managed Care – PPO | Admitting: Hematology and Oncology

## 2022-11-05 VITALS — BP 117/74 | HR 69 | Temp 97.6°F | Resp 16

## 2022-11-05 DIAGNOSIS — K5900 Constipation, unspecified: Secondary | ICD-10-CM | POA: Diagnosis not present

## 2022-11-05 DIAGNOSIS — Z170 Estrogen receptor positive status [ER+]: Secondary | ICD-10-CM

## 2022-11-05 DIAGNOSIS — B181 Chronic viral hepatitis B without delta-agent: Secondary | ICD-10-CM | POA: Diagnosis not present

## 2022-11-05 DIAGNOSIS — Z5189 Encounter for other specified aftercare: Secondary | ICD-10-CM | POA: Diagnosis not present

## 2022-11-05 DIAGNOSIS — C50412 Malignant neoplasm of upper-outer quadrant of left female breast: Secondary | ICD-10-CM | POA: Diagnosis not present

## 2022-11-05 DIAGNOSIS — R5383 Other fatigue: Secondary | ICD-10-CM | POA: Diagnosis not present

## 2022-11-05 DIAGNOSIS — E860 Dehydration: Secondary | ICD-10-CM | POA: Diagnosis not present

## 2022-11-05 DIAGNOSIS — K1230 Oral mucositis (ulcerative), unspecified: Secondary | ICD-10-CM | POA: Diagnosis not present

## 2022-11-05 DIAGNOSIS — Z5111 Encounter for antineoplastic chemotherapy: Secondary | ICD-10-CM | POA: Diagnosis not present

## 2022-11-05 LAB — CMP (CANCER CENTER ONLY)
ALT: 13 U/L (ref 0–44)
AST: 17 U/L (ref 15–41)
Albumin: 4.1 g/dL (ref 3.5–5.0)
Alkaline Phosphatase: 80 U/L (ref 38–126)
Anion gap: 8 (ref 5–15)
BUN: 17 mg/dL (ref 6–20)
CO2: 24 mmol/L (ref 22–32)
Calcium: 9.6 mg/dL (ref 8.9–10.3)
Chloride: 104 mmol/L (ref 98–111)
Creatinine: 0.79 mg/dL (ref 0.44–1.00)
GFR, Estimated: >60 mL/min (ref >60)
Glucose, Bld: 161 mg/dL — ABNORMAL HIGH (ref 70–99)
Potassium: 3.7 mmol/L (ref 3.5–5.1)
Sodium: 136 mmol/L (ref 135–145)
Total Bilirubin: 0.4 mg/dL (ref 0.3–1.2)
Total Protein: 7.2 g/dL (ref 6.5–8.1)

## 2022-11-05 LAB — CBC WITH DIFFERENTIAL (CANCER CENTER ONLY)
Abs Immature Granulocytes: 0.08 10*3/uL — ABNORMAL HIGH (ref 0.00–0.07)
Basophils Absolute: 0 10*3/uL (ref 0.0–0.1)
Basophils Relative: 0 %
Eosinophils Absolute: 0 10*3/uL (ref 0.0–0.5)
Eosinophils Relative: 0 %
HCT: 36.2 % (ref 36.0–46.0)
Hemoglobin: 12.5 g/dL (ref 12.0–15.0)
Immature Granulocytes: 1 %
Lymphocytes Relative: 9 %
Lymphs Abs: 1.1 10*3/uL (ref 0.7–4.0)
MCH: 29 pg (ref 26.0–34.0)
MCHC: 34.5 g/dL (ref 30.0–36.0)
MCV: 84 fL (ref 80.0–100.0)
Monocytes Absolute: 0.6 10*3/uL (ref 0.1–1.0)
Monocytes Relative: 4 %
Neutro Abs: 11.5 10*3/uL — ABNORMAL HIGH (ref 1.7–7.7)
Neutrophils Relative %: 86 %
Platelet Count: 376 10*3/uL (ref 150–400)
RBC: 4.31 MIL/uL (ref 3.87–5.11)
RDW: 12.5 % (ref 11.5–15.5)
WBC Count: 13.3 10*3/uL — ABNORMAL HIGH (ref 4.0–10.5)
nRBC: 0 % (ref 0.0–0.2)

## 2022-11-05 LAB — PREGNANCY, URINE: Preg Test, Ur: NEGATIVE

## 2022-11-05 MED ORDER — SODIUM CHLORIDE 0.9 % IV SOLN
600.0000 mg/m2 | Freq: Once | INTRAVENOUS | Status: AC
  Administered 2022-11-05: 1000 mg via INTRAVENOUS
  Filled 2022-11-05: qty 50

## 2022-11-05 MED ORDER — SODIUM CHLORIDE 0.9 % IV SOLN
10.0000 mg | Freq: Once | INTRAVENOUS | Status: AC
  Administered 2022-11-05: 10 mg via INTRAVENOUS
  Filled 2022-11-05: qty 10

## 2022-11-05 MED ORDER — FAMOTIDINE IN NACL 20-0.9 MG/50ML-% IV SOLN
20.0000 mg | Freq: Once | INTRAVENOUS | Status: AC
  Administered 2022-11-05: 20 mg via INTRAVENOUS
  Filled 2022-11-05: qty 50

## 2022-11-05 MED ORDER — SODIUM CHLORIDE 0.9 % IV SOLN
75.0000 mg/m2 | Freq: Once | INTRAVENOUS | Status: AC
  Administered 2022-11-05: 133 mg via INTRAVENOUS
  Filled 2022-11-05: qty 13.3

## 2022-11-05 MED ORDER — SODIUM CHLORIDE 0.9 % IV SOLN: Freq: Once | INTRAVENOUS | Status: AC

## 2022-11-05 MED ORDER — PALONOSETRON HCL INJECTION 0.25 MG/5ML
0.2500 mg | Freq: Once | INTRAVENOUS | Status: AC
  Administered 2022-11-05: 0.25 mg via INTRAVENOUS
  Filled 2022-11-05: qty 5

## 2022-11-05 NOTE — Progress Notes (Signed)
Pt. complained of heartburn prior to starting treatment today. Dr. Al Pimple notified and new order received.

## 2022-11-05 NOTE — Progress Notes (Signed)
[facility] Cancer Follow up:    Sara Morales, NP [address] [place] [address]   DIAGNOSIS:  Cancer Staging  Malignant neoplasm of upper-outer quadrant of left breast in female, estrogen receptor positive (HCC) Staging form: Breast, AJCC 8th Edition - Clinical: Stage IIA (cT2, cN0, cM0, G3, ER+, PR+, HER2-) - Signed by [name], MD on 07/09/2022 Histologic grading system: 3 grade system - Pathologic: Stage IB (pT2, pN0, cM0, G3, ER+, PR+, HER2-) - Signed by [name], NP on 10/15/2022 Histologic grading system: 3 grade system   SUMMARY OF ONCOLOGIC HISTORY: Oncology History  Malignant neoplasm of upper-outer quadrant of left breast in female, estrogen receptor positive (HCC)  07/09/2022 Initial Diagnosis   Malignant neoplasm of upper-outer quadrant of left breast in female, estrogen receptor positive (HCC)   07/09/2022 Cancer Staging   Staging form: Breast, AJCC 8th Edition - Clinical: Stage IIA (cT2, cN0, cM0, G3, ER+, PR+, HER2-) - Signed by [name], MD on 07/09/2022 Histologic grading system: 3 grade system    Genetic Testing   [facility] Multi-Cancer Panel+RNA was Negative. Of note, a variant of uncertain significance was detected in the HOXB13 gene (c.208C>T) and MUTYH gene (c.1547C>T). Report date is 07/24/2022.  The Multi-Cancer + RNA Panel offered by [facility] includes sequencing and/or deletion/duplication analysis of the following 70 genes:  AIP*, ALK, APC*, ATM*, AXIN2*, BAP1*, BARD1*, BLM*, BMPR1A*, BRCA1*, BRCA2*, BRIP1*, CDC73*, CDH1*, CDK4, CDKN1B*, CDKN2A, CHEK2*, CTNNA1*, DICER1*, EPCAM (del/dup only), EGFR, FH*, FLCN*, GREM1 (promoter dup only), HOXB13, KIT, LZTR1, MAX*, MBD4, MEN1*, MET, MITF, MLH1*, MSH2*, MSH3*, MSH6*, MUTYH*, NF1*, NF2*, NTHL1*, PALB2*, PDGFRA, PMS2*, POLD1*, POLE*, POT1*, PRKAR1A*, PTCH1*, PTEN*, RAD51C*, RAD51D*, RB1*, RET, SDHA* (sequencing only), SDHAF2*, SDHB*, SDHC*, SDHD*, SMAD4*, SMARCA4*, SMARCB1*,  SMARCE1*, STK11*, SUFU*, TMEM127*, TP53*, TSC1*, TSC2*, VHL*. RNA analysis is performed for * genes.   08/01/2022 Surgery   Left breast Lumpectomy: IDC, 3.1cm, grade 3, margins negative, no LVI, ER/PR+, Ki-67 70%, HER2-.  9 SLN negative.   08/01/2022 Oncotype testing   24/10%, indicating approximate 6.5% CT benefit. She was not a candidate for OFSET trial because of Hep C status   10/15/2022 -  Chemotherapy   Patient is on Treatment Plan : BREAST TC q21d     10/15/2022 Cancer Staging   Staging form: Breast, AJCC 8th Edition - Pathologic: Stage IB (pT2, pN0, cM0, G3, ER+, PR+, HER2-) - Signed by [name], NP on 10/15/2022 Histologic grading system: 3 grade system     CURRENT THERAPY: Taxotere/Cytoxan  INTERVAL HISTORY: Sara Browning 47 y.o. female returns for f/u after receiving her first cycle of Taxotere/Cytoxan.   She started adjuvant TC on 10/15/2022. She felt her scalp burn after chemo. She has noted mouth sores, cracking in mouth. She went to the ED in [place], had some IVF, gave some pain medication for her back pain. No fever or chills. Mild tingling/numbness which was transient. Nausea, mild to moderate, had to take anti nausea medication. She was also constipated. Rest of the pertinent 10 point ROS reviewed and neg  Patient Active Problem List   Diagnosis Date Noted   Chronic viral hepatitis B without delta agent and without coma (HCC) 09/25/2022   Genetic testing 07/25/2022   Malignant neoplasm of upper-outer quadrant of left breast in female, estrogen receptor positive (HCC) 07/09/2022    has No Known Allergies.  MEDICAL HISTORY: Past Medical History:  Diagnosis Date   Cancer [facility]) 2024   Left Breat Cancer  Hepatitis    Hep B Carrier   Hepatitis 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)    Pneumonia    Hx   Urticaria     SURGICAL HISTORY: Past Surgical History:  Procedure Laterality Date   BREAST BIOPSY Left 07/30/2022   [place] LT RADIOACTIVE SEED LOC 07/30/2022 [facility]  MAMMOGRAPHY   BREAST LUMPECTOMY WITH RADIOACTIVE SEED AND SENTINEL LYMPH NODE BIOPSY Left 08/01/2022   Procedure: LEFT BREAST LUMPECTOMY WITH RADIOACTIVE SEED AND SENTINEL LYMPH NODE BIOPSY;  Surgeon: Harriette Bouillon, MD;  Location: [facility] OR;  Service: General;  Laterality: Left;   CESAREAN SECTION     twice    SOCIAL HISTORY: Social History   Socioeconomic History   Marital status: Married    Spouse name: Not on file   Number of children: Not on file   Years of education: Not on file   Highest education level: Not on file  Occupational History   Not on file  Tobacco Use   Smoking status: Never   Smokeless tobacco: Never   Tobacco comments:    Drinks seldom  Vaping Use   Vaping status: Never Used  Substance and Sexual Activity   Alcohol use: Not Currently    Comment: occasionally   Drug use: Never   Sexual activity: Yes  Other Topics Concern   Not on file  Social History Narrative   Not on file   Social Determinants of Health   Financial Resource Strain: Not on file  Food Insecurity: No Food Insecurity (07/19/2022)   Hunger Vital Sign    Worried About Running Out of Food in the Last Year: Never true    Ran Out of Food in the Last Year: Never true  Transportation Needs: No Transportation Needs (07/19/2022)   PRAPARE - Administrator, Civil Service (Medical): No    Lack of Transportation (Non-Medical): No  Physical Activity: Not on file  Stress: Not on file  Social Connections: Not on file  Intimate Partner Violence: Not on file    FAMILY HISTORY: Family History  Problem Relation Age of Onset   Liver cancer Maternal Aunt    Breast cancer Maternal Grandmother    Hypertension Paternal Grandmother    Diabetes Paternal Grandfather     PHYSICAL EXAMINATION    Vitals:   11/05/22 0913  BP: 107/63  Pulse: 96  Resp: 16  Temp: (!) 97.3 F (36.3 C)  SpO2: 100%    Physical Exam Constitutional:      General: She is not in acute distress.     Appearance: Normal appearance. She is not ill-appearing or toxic-appearing.  HENT:     Head: Normocephalic and atraumatic.     Mouth/Throat:     Mouth: Mucous membranes are moist.     Pharynx: Oropharynx is clear. No oropharyngeal exudate or posterior oropharyngeal erythema.  Eyes:     General: No scleral icterus. Cardiovascular:     Rate and Rhythm: Regular rhythm.     Pulses: Normal pulses.     Heart sounds: Normal heart sounds.  Pulmonary:     Effort: Pulmonary effort is normal.     Breath sounds: Normal breath sounds.  Abdominal:     General: Abdomen is flat. Bowel sounds are normal. There is no distension.     Palpations: Abdomen is soft.     Tenderness: There is no abdominal tenderness.  Musculoskeletal:        General: No swelling.     Cervical back: Neck supple.  Lymphadenopathy:  Cervical: No cervical adenopathy.  Skin:    General: Skin is warm and dry.     Findings: No rash.  Neurological:     General: No focal deficit present.     Mental Status: She is alert.  Psychiatric:        Mood and Affect: Mood normal.        Behavior: Behavior normal.     LABORATORY DATA:  CBC    Component Value Date/Time   WBC 13.3 (H) 11/05/2022 0843   WBC 7.0 07/31/2022 0925   RBC 4.31 11/05/2022 0843   HGB 12.5 11/05/2022 0843   HGB 14.9 03/28/2020 1118   HCT 36.2 11/05/2022 0843   HCT 43.6 03/28/2020 1118   PLT 376 11/05/2022 0843   PLT 257 03/28/2020 1118   MCV 84.0 11/05/2022 0843   MCV 84 03/28/2020 1118   MCH 29.0 11/05/2022 0843   MCHC 34.5 11/05/2022 0843   RDW 12.5 11/05/2022 0843   RDW 11.9 03/28/2020 1118   LYMPHSABS 1.1 11/05/2022 0843   LYMPHSABS 2.5 03/28/2020 1118   MONOABS 0.6 11/05/2022 0843   EOSABS 0.0 11/05/2022 0843   EOSABS 0.1 03/28/2020 1118   BASOSABS 0.0 11/05/2022 0843   BASOSABS 0.0 03/28/2020 1118    CMP     Component Value Date/Time   NA 136 11/05/2022 0843   NA 136 03/28/2020 1118   K 3.7 11/05/2022 0843   CL 104 11/05/2022  0843   CO2 24 11/05/2022 0843   GLUCOSE 161 (H) 11/05/2022 0843   BUN 17 11/05/2022 0843   BUN 11 03/28/2020 1118   CREATININE 0.79 11/05/2022 0843   CALCIUM 9.6 11/05/2022 0843   PROT 7.2 11/05/2022 0843   PROT 7.5 03/28/2020 1118   ALBUMIN 4.1 11/05/2022 0843   ALBUMIN 4.3 03/28/2020 1118   AST 17 11/05/2022 0843   ALT 13 11/05/2022 0843   ALT 12 09/24/2022 1523   ALKPHOS 80 11/05/2022 0843   BILITOT 0.4 11/05/2022 0843   GFRNONAA >60 11/05/2022 0843   GFRAA 102 03/28/2020 1118       ASSESSMENT and THERAPY PLAN:   Malignant neoplasm of upper-outer quadrant of left breast in female, estrogen receptor positive (HCC) This is a very pleasant 47 year old premenopausal female patient with no significant family history of breast cancer with newly diagnosed left breast upper outer quadrant T2 N0 M0 grade 3 IDC, ER/PR strongly positive HER2 1+ by IHC Ki-67 of 70% referred to medical oncology and surgical oncology for recommendations.  I agree with the considering breast MRI for accurate sizing.  Given strong ER/PR positivity despite high-grade and high proliferation index, if she is indeed node-negative is appropriate to continue or proceed with surgery upfront.  She is status post lumpectomy, final pathology showed a grade three 3.1 cm IDC, ER/PR positive HER2 negative, negative margins, negative lymph nodes.  Oncotype DX of 24.  She will be an excellent candidate for OFSET trial.  She unfortunately could not qualify for the trial because of her hepatitis B status.  She is now on adjuvant chemotherapy and prophylactic tenofovir.  Her liver function tests have now normalized again.  She is doing quite well except for some arthralgias secondary to G-CSF, nausea, constipation.  She is however motivated to try and proceed with the same dose for the second cycle.  She will keep [place] posted if her adverse effects are intolerable so we can dose reduced with cycle 3.  No concerns on physical exam today.   She will  return to clinic before cycle 3 of TC.  Postchemotherapy she will proceed with adjuvant radiation followed by adjuvant antiestrogen therapy.    All questions were answered. The patient knows to call the clinic with any problems, questions or concerns. We can certainly see the patient much sooner if necessary.  Total encounter time:30 minutes*in face-to-face visit time, chart review, lab review, care coordination, order entry, and documentation of the encounter time.   *Total Encounter Time as defined by the Centers for Medicare and Medicaid Services includes, in addition to the face-to-face time of a patient visit (documented in the note above) non-face-to-face time: obtaining and reviewing outside history, ordering and reviewing medications, tests or procedures, care coordination (communications with other health care professionals or caregivers) and documentation in the medical record.

## 2022-11-05 NOTE — Assessment & Plan Note (Signed)
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  I agree with the considering breast MRI for accurate sizing.  Given strong ER/PR positivity despite high-grade and high proliferation index, if she is indeed node-negative is appropriate to continue or proceed with surgery upfront.  She is status post lumpectomy, final pathology showed a grade three 3.1 cm IDC, ER/PR positive HER2 negative, negative margins, negative lymph nodes.  Oncotype DX of 24.  She will be an excellent candidate for OFSET trial.  She unfortunately could not qualify for the trial because of her hepatitis B status.  She is now on adjuvant chemotherapy and prophylactic tenofovir.  Her liver function tests have now normalized again.  She is doing quite well except for some arthralgias secondary to G-CSF, nausea, constipation.  She is however motivated to try and proceed with the same dose for the second cycle.  She will keep [place] posted if her adverse effects are intolerable so we can dose reduced with cycle 3.  No concerns on physical exam today.  She will return to clinic before cycle 3 of TC.  Postchemotherapy she will proceed with adjuvant radiation followed by adjuvant antiestrogen therapy.

## 2022-11-05 NOTE — Patient Instructions (Addendum)
[facility]  Discharge Instructions: Thank you for choosing [facility] to provide your oncology and hematology care.   If you have a lab appointment with the Cancer Center, please go directly to the Cancer Center and check in at the registration area.   Wear comfortable clothing and clothing appropriate for easy access to any Portacath or PICC line.   We strive to give you quality time with your provider. You may need to reschedule your appointment if you arrive late (15 or more minutes).  Arriving late affects you and other patients whose appointments are after yours.  Also, if you miss three or more appointments without notifying the office, you may be dismissed from the clinic at the provider's discretion.      For prescription refill requests, have your pharmacy contact our office and allow 72 hours for refills to be completed.    Today you received the following chemotherapy and/or immunotherapy agents: Docetaxel (Taxotere) and Cytoxan.   To help prevent nausea and vomiting after your treatment, we encourage you to take your nausea medication as directed.  BELOW ARE SYMPTOMS THAT SHOULD BE REPORTED IMMEDIATELY: *FEVER GREATER THAN 100.4 F (38 C) OR HIGHER *CHILLS OR SWEATING *NAUSEA AND VOMITING THAT IS NOT CONTROLLED WITH YOUR NAUSEA MEDICATION *UNUSUAL SHORTNESS OF BREATH *UNUSUAL BRUISING OR BLEEDING *URINARY PROBLEMS (pain or burning when urinating, or frequent urination) *BOWEL PROBLEMS (unusual diarrhea, constipation, pain near the anus) TENDERNESS IN MOUTH AND THROAT WITH OR WITHOUT PRESENCE OF ULCERS (sore throat, sores in mouth, or a toothache) UNUSUAL RASH, SWELLING OR PAIN  UNUSUAL VAGINAL DISCHARGE OR ITCHING   Items with * indicate a potential emergency and should be followed up as soon as possible or go to the Emergency Department if any problems should occur.  Please show the CHEMOTHERAPY ALERT CARD or  IMMUNOTHERAPY ALERT CARD at check-in to the Emergency Department and triage nurse.  Should you have questions after your visit or need to cancel or reschedule your appointment, please contact [facility]  Dept: [phone]  and follow the prompts.  Office hours are 8:00 a.m. to 4:30 p.m. Monday - Friday. Please note that voicemails left after 4:00 p.m. may not be returned until the following business day.  We are closed weekends and major holidays. You have access to a nurse at all times for urgent questions. Please call the main number to the clinic Dept: [phone] and follow the prompts.   For any non-urgent questions, you may also contact your provider using MyChart. We now offer e-Visits for anyone 87 and older to request care online for non-urgent symptoms. For details visit [email or web address].   Also download the MyChart app! Go to the app store, search "MyChart", open the app, select [place], and log in with your MyChart username and password.  Docetaxel Injection What is this medication? DOCETAXEL (doe se TAX el) treats some types of cancer. It works by slowing down the growth of cancer cells. This medicine may be used for other purposes; ask your health care provider or pharmacist if you have questions. COMMON BRAND NAME(S): Docefrez, Taxotere What should I tell my care team before I take this medication? They need to know if you have any of these conditions: Kidney disease Liver disease Low white blood cell levels Tingling of the fingers or toes or other nerve disorder An unusual or allergic reaction to docetaxel, polysorbate 80, other medications, foods, dyes, or  preservatives Pregnant or trying to get pregnant Breast-feeding How should I use this medication? This medication is injected into a vein. It is given by your care team in a hospital or clinic setting. Talk to your care team about the use of this medication in children.  Special care may be needed. Overdosage: If you think you have taken too much of this medicine contact a poison control center or emergency room at once. NOTE: This medicine is only for you. Do not share this medicine with others. What if I miss a dose? Keep appointments for follow-up doses. It is important not to miss your dose. Call your care team if you are unable to keep an appointment. What may interact with this medication? Do not take this medication with any of the following: Live virus vaccines This medication may also interact with the following: Certain antibiotics, such as clarithromycin, telithromycin Certain antivirals for HIV or hepatitis Certain medications for fungal infections, such as itraconazole, ketoconazole, voriconazole Grapefruit juice Nefazodone Supplements, such as St. John's wort This list may not describe all possible interactions. Give your health care provider a list of all the medicines, herbs, non-prescription drugs, or dietary supplements you use. Also tell them if you smoke, drink alcohol, or use illegal drugs. Some items may interact with your medicine. What should I watch for while using this medication? This medication may make you feel generally unwell. This is not uncommon as chemotherapy can affect healthy cells as well as cancer cells. Report any side effects. Continue your course of treatment even though you feel ill unless your care team tells you to stop. You may need blood work done while you are taking this medication. This medication can cause serious side effects and infusion reactions. To reduce the risk, your care team may give you other medications to take before receiving this one. Be sure to follow the directions from your care team. This medication may increase your risk of getting an infection. Call your care team for advice if you get a fever, chills, sore throat, or other symptoms of a cold or flu. Do not treat yourself. Try to avoid being  around people who are sick. Avoid taking medications that contain aspirin, acetaminophen, ibuprofen, naproxen, or ketoprofen unless instructed by your care team. These medications may hide a fever. Be careful brushing or flossing your teeth or using a toothpick because you may get an infection or bleed more easily. If you have any dental work done, tell your dentist you are receiving this medication. Some products may contain alcohol. Ask your care team if this medication contains alcohol. Be sure to tell all care teams you are taking this medicine. Certain medications, like metronidazole and disulfiram, can cause an unpleasant reaction when taken with alcohol. The reaction includes flushing, headache, nausea, vomiting, sweating, and increased thirst. The reaction can last from 30 minutes to several hours. This medication may affect your coordination, reaction time, or judgement. Do not drive or operate machinery until you know how this medication affects you. Sit up or stand slowly to reduce the risk of dizzy or fainting spells. Drinking alcohol with this medication can increase the risk of these side effects. Talk to your care team about your risk of cancer. You may be more at risk for certain types of cancer if you take this medication. Talk to your care team if you wish to become pregnant or think you might be pregnant. This medication can cause serious birth defects if taken during pregnancy  or if you get pregnant within 2 months after stopping therapy. A negative pregnancy test is required before starting this medication. A reliable form of contraception is recommended while taking this medication and for 2 months after stopping it. Talk to your care team about reliable forms of contraception. Do not breast-feed while taking this medication and for 1 week after stopping therapy. Use a condom during sex and for 4 months after stopping therapy. Tell your care team right away if you think your partner  might be pregnant. This medication can cause serious birth defects. This medication may cause infertility. Talk to your care team if you are concerned about your fertility. What side effects may I notice from receiving this medication? Side effects that you should report to your care team as soon as possible: Allergic reactions--skin rash, itching, hives, swelling of the face, lips, tongue, or throat Change in vision such as blurry vision, seeing halos around lights, vision loss Infection--fever, chills, cough, or sore throat Infusion reactions--chest pain, shortness of breath or trouble breathing, feeling faint or lightheaded Low red blood cell level--unusual weakness or fatigue, dizziness, headache, trouble breathing Pain, tingling, or numbness in the hands or feet Painful swelling, warmth, or redness of the skin, blisters or sores at the infusion site Redness, blistering, peeling, or loosening of the skin, including inside the mouth Sudden or severe stomach pain, bloody diarrhea, fever, nausea, vomiting Swelling of the ankles, hands, or feet Tumor lysis syndrome (TLS)--nausea, vomiting, diarrhea, decrease in the amount of urine, dark urine, unusual weakness or fatigue, confusion, muscle pain or cramps, fast or irregular heartbeat, joint pain Unusual bruising or bleeding Side effects that usually do not require medical attention (report to your care team if they continue or are bothersome): Change in nail shape, thickness, or color Change in taste Hair loss Increased tears This list may not describe all possible side effects. Call your doctor for medical advice about side effects. You may report side effects to FDA at 1-800-FDA-1088. Where should I keep my medication? This medication is given in a hospital or clinic. It will not be stored at home. NOTE: This sheet is a summary. It may not cover all possible information. If you have questions about this medicine, talk to your doctor,  pharmacist, or health care provider.  2024 Elsevier/Gold Standard (2021-06-14 00:00:00) Cyclophosphamide Injection What is this medication? CYCLOPHOSPHAMIDE (sye kloe FOSS fa mide) treats some types of cancer. It works by slowing down the growth of cancer cells. This medicine may be used for other purposes; ask your health care provider or pharmacist if you have questions. COMMON BRAND NAME(S): Cyclophosphamide, Cytoxan, Neosar What should I tell my care team before I take this medication? They need to know if you have any of these conditions: Heart disease Irregular heartbeat or rhythm Infection Kidney problems Liver disease Low blood cell levels (white cells, platelets, or red blood cells) Lung disease Previous radiation Trouble passing urine An unusual or allergic reaction to cyclophosphamide, other medications, foods, dyes, or preservatives Pregnant or trying to get pregnant Breast-feeding How should I use this medication? This medication is injected into a vein. It is given by your care team in a hospital or clinic setting. Talk to your care team about the use of this medication in children. Special care may be needed. Overdosage: If you think you have taken too much of this medicine contact a poison control center or emergency room at once. NOTE: This medicine is only for you. Do not share  this medicine with others. What if I miss a dose? Keep appointments for follow-up doses. It is important not to miss your dose. Call your care team if you are unable to keep an appointment. What may interact with this medication? Amphotericin B Amiodarone Azathioprine Certain antivirals for HIV or hepatitis Certain medications for blood pressure, such as enalapril, lisinopril, quinapril Cyclosporine Diuretics Etanercept Indomethacin Medications that relax muscles Metronidazole Natalizumab Tamoxifen Warfarin This list may not describe all possible interactions. Give your health care  provider a list of all the medicines, herbs, non-prescription drugs, or dietary supplements you use. Also tell them if you smoke, drink alcohol, or use illegal drugs. Some items may interact with your medicine. What should I watch for while using this medication? This medication may make you feel generally unwell. This is not uncommon as chemotherapy can affect healthy cells as well as cancer cells. Report any side effects. Continue your course of treatment even though you feel ill unless your care team tells you to stop. You may need blood work while you are taking this medication. This medication may increase your risk of getting an infection. Call your care team for advice if you get a fever, chills, sore throat, or other symptoms of a cold or flu. Do not treat yourself. Try to avoid being around people who are sick. Avoid taking medications that contain aspirin, acetaminophen, ibuprofen, naproxen, or ketoprofen unless instructed by your care team. These medications may hide a fever. Be careful brushing or flossing your teeth or using a toothpick because you may get an infection or bleed more easily. If you have any dental work done, tell your dentist you are receiving this medication. Drink water or other fluids as directed. Urinate often, even at night. Some products may contain alcohol. Ask your care team if this medication contains alcohol. Be sure to tell all care teams you are taking this medicine. Certain medicines, like metronidazole and disulfiram, can cause an unpleasant reaction when taken with alcohol. The reaction includes flushing, headache, nausea, vomiting, sweating, and increased thirst. The reaction can last from 30 minutes to several hours. Talk to your care team if you wish to become pregnant or think you might be pregnant. This medication can cause serious birth defects if taken during pregnancy and for 1 year after the last dose. A negative pregnancy test is required before starting  this medication. A reliable form of contraception is recommended while taking this medication and for 1 year after the last dose. Talk to your care team about reliable forms of contraception. Do not father a child while taking this medication and for 4 months after the last dose. Use a condom during this time period. Do not breast-feed while taking this medication or for 1 week after the last dose. This medication may cause infertility. Talk to your care team if you are concerned about your fertility. Talk to your care team about your risk of cancer. You may be more at risk for certain types of cancer if you take this medication. What side effects may I notice from receiving this medication? Side effects that you should report to your care team as soon as possible: Allergic reactions--skin rash, itching, hives, swelling of the face, lips, tongue, or throat Dry cough, shortness of breath or trouble breathing Heart failure--shortness of breath, swelling of the ankles, feet, or hands, sudden weight gain, unusual weakness or fatigue Heart muscle inflammation--unusual weakness or fatigue, shortness of breath, chest pain, fast or irregular heartbeat, dizziness,  swelling of the ankles, feet, or hands Heart rhythm changes--fast or irregular heartbeat, dizziness, feeling faint or lightheaded, chest pain, trouble breathing Infection--fever, chills, cough, sore throat, wounds that don't heal, pain or trouble when passing urine, general feeling of discomfort or being unwell Kidney injury--decrease in the amount of urine, swelling of the ankles, hands, or feet Liver injury--right upper belly pain, loss of appetite, nausea, light-colored stool, dark yellow or brown urine, yellowing skin or eyes, unusual weakness or fatigue Low red blood cell level--unusual weakness or fatigue, dizziness, headache, trouble breathing Low sodium level--muscle weakness, fatigue, dizziness, headache, confusion Red or dark brown  urine Unusual bruising or bleeding Side effects that usually do not require medical attention (report to your care team if they continue or are bothersome): Hair loss Irregular menstrual cycles or spotting Loss of appetite Nausea Pain, redness, or swelling with sores inside the mouth or throat Vomiting This list may not describe all possible side effects. Call your doctor for medical advice about side effects. You may report side effects to FDA at 1-800-FDA-1088. Where should I keep my medication? This medication is given in a hospital or clinic. It will not be stored at home. NOTE: This sheet is a summary. It may not cover all possible information. If you have questions about this medicine, talk to your doctor, pharmacist, or health care provider.  2024 Elsevier/Gold Standard (2021-08-24 00:00:00) Pepcid over the counter 20 mg by mouth daily until Dr. Al Pimple tells you to stop.

## 2022-11-06 ENCOUNTER — Ambulatory Visit (INDEPENDENT_AMBULATORY_CARE_PROVIDER_SITE_OTHER): Payer: BC Managed Care – PPO | Admitting: Family

## 2022-11-06 ENCOUNTER — Other Ambulatory Visit: Payer: Self-pay | Admitting: Hematology and Oncology

## 2022-11-06 ENCOUNTER — Encounter: Payer: Self-pay | Admitting: Family

## 2022-11-06 ENCOUNTER — Other Ambulatory Visit: Payer: Self-pay

## 2022-11-06 VITALS — BP 104/66 | HR 72 | Temp 97.7°F | Ht 63.0 in | Wt 162.0 lb

## 2022-11-06 DIAGNOSIS — B181 Chronic viral hepatitis B without delta-agent: Secondary | ICD-10-CM

## 2022-11-06 DIAGNOSIS — Z170 Estrogen receptor positive status [ER+]: Secondary | ICD-10-CM

## 2022-11-06 NOTE — Assessment & Plan Note (Addendum)
Ms. Bohall has chronic inactive Hepatitis B with normal liver function testing and no evidence of cirrhosis. Tolerating tenofovir as suppression and to prevent re-activation with risk of chemotherapy and immune suppression. Will ask Oncology to check Hepatitis B DNA level with next lab work given her frequent lab draws. Continue current dose of tenofovir for as long as immune suppression remains a possibility. Plan for follow up in 5 months or sooner if needed.

## 2022-11-06 NOTE — Progress Notes (Signed)
Subjective:    Patient ID: Sara Browning, female    DOB: 1975/06/08, 47 y.o.   MRN: [id number]  Chief Complaint  Patient presents with   Follow-up   Hepatitis B    HPI:  Sara Browning is a 47 y.o. female chronic inactive Hepatitis B and breast cancer receiving chemotherapy last sen on 09/24/22 with chronic inactive Hepatitis B and plan to start chemotherapy and radiation. Hepatitis B lab work consistent with inactive Hepatitis B with Hepatitis B DNA level 70 and normal liver function testing. Started on tenofovir for prevention of reactivation of Hepatitis B. In the interim started on chemotherapy and here today for follow up.  Sara Browning is present with her husband. Currently has received 2 out of 4 rounds of chemotherapy with plans for 5 weeks of radiation. Taking the tenofovir as prescribed with no adverse side effects or problems getting from the pharmacy. Denies abdominal pain, nausea, vomiting, fatigue, fever, scleral icterus or jaundice.     No Known Allergies    Outpatient Medications Prior to Visit  Medication Sig Dispense Refill   dexamethasone (DECADRON) 4 MG tablet Take 2 tabs by mouth 2 times daily starting day before chemo. Then take 2 tabs daily for 2 days starting day after chemo, then take 1 tab daily x 3 days. Take with food. 30 tablet 1   loratadine (CLARITIN) 10 MG tablet Take 10 mg by mouth daily as needed for allergies.     magic mouthwash (nystatin, lidocaine, diphenhydrAMINE, alum & mag hydroxide) suspension Swish and swallow 5-10 mls by mouth 4 times a day as needed 240 mL 2   magic mouthwash w/lidocaine SOLN Take 5 mLs by mouth 4 (four) times daily as needed for mouth pain. 240 mL 2   Melatonin 5 MG CHEW Chew 5 mg by mouth at bedtime as needed (sleep).     Multiple Vitamin (MULTIVITAMIN) tablet Take 1 tablet by mouth daily.     ondansetron (ZOFRAN) 8 MG tablet Take 1 tablet (8 mg total) by mouth every 8 (eight) hours as needed for nausea or vomiting.  Start on the third day after chemotherapy. 30 tablet 1   OVER THE COUNTER MEDICATION Take 1 Scoop by mouth daily. prolean greens supplement powder     prochlorperazine (COMPAZINE) 10 MG tablet Take 1 tablet (10 mg total) by mouth every 6 (six) hours as needed for nausea or vomiting. 30 tablet 1   Tenofovir Alafenamide Fumarate (VEMLIDY) 25 MG TABS Take 1 tablet (25 mg total) by mouth daily. 30 tablet 5   OVER THE COUNTER MEDICATION Take 2 capsules by mouth daily. Graviola supplement (Patient not taking: Reported on 10/02/2022)     oxyCODONE (OXY IR/ROXICODONE) 5 MG immediate release tablet Take 1 tablet (5 mg total) by mouth every 6 (six) hours as needed for severe pain. (Patient not taking: Reported on 10/02/2022) 15 tablet 0   No facility-administered medications prior to visit.     Past Medical History:  Diagnosis Date   Cancer (HCC) 2024   Left Breat Cancer   Hepatitis    Hep B Carrier   Hepatitis 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)    Pneumonia    Hx   Urticaria      Past Surgical History:  Procedure Laterality Date   BREAST BIOPSY Left 07/30/2022   [place] LT RADIOACTIVE SEED LOC 07/30/2022 [facility]   BREAST LUMPECTOMY WITH RADIOACTIVE SEED AND SENTINEL LYMPH NODE BIOPSY Left 08/01/2022   Procedure: LEFT BREAST LUMPECTOMY WITH RADIOACTIVE  SEED AND SENTINEL LYMPH NODE BIOPSY;  Surgeon: Harriette Bouillon, MD;  Location: [facility] OR;  Service: General;  Laterality: Left;   CESAREAN SECTION     twice       Review of Systems  Constitutional:  Negative for chills, fatigue, fever and unexpected weight change.  Respiratory:  Negative for cough, chest tightness, shortness of breath and wheezing.   Cardiovascular:  Negative for chest pain and leg swelling.  Gastrointestinal:  Negative for abdominal distention, constipation, diarrhea, nausea and vomiting.  Neurological:  Negative for dizziness, weakness, light-headedness and headaches.  Hematological:  Does not bruise/bleed easily.      Objective:    BP  104/66   Pulse 72   Temp 97.7 F (36.5 C) (Temporal)   Ht 5\' 3"  (1.6 m)   Wt 162 lb (73.5 kg)   SpO2 99%   BMI 28.70 kg/m  Nursing note and vital signs reviewed.  Physical Exam Constitutional:      General: She is not in acute distress.    Appearance: She is well-developed.  Cardiovascular:     Rate and Rhythm: Normal rate and regular rhythm.     Heart sounds: Normal heart sounds. No murmur heard.    No friction rub. No gallop.  Pulmonary:     Effort: Pulmonary effort is normal. No respiratory distress.     Breath sounds: Normal breath sounds. No wheezing or rales.  Chest:     Chest wall: No tenderness.  Abdominal:     General: Bowel sounds are normal. There is no distension.     Palpations: Abdomen is soft. There is no mass.     Tenderness: There is no abdominal tenderness. There is no guarding or rebound.  Skin:    General: Skin is warm and dry.  Neurological:     Mental Status: She is alert and oriented to person, place, and time.  Psychiatric:        Behavior: Behavior normal.        Thought Content: Thought content normal.        Judgment: Judgment normal.         11/06/2022    9:30 AM 09/24/2022    2:45 PM  Depression screen PHQ 2/9  Decreased Interest 0 0  Down, Depressed, Hopeless 0 0  PHQ - 2 Score 0 0       Assessment & Plan:    Patient Active Problem List   Diagnosis Date Noted   Chronic viral hepatitis B without delta agent and without coma (HCC) 09/25/2022   Genetic testing 07/25/2022   Malignant neoplasm of upper-outer quadrant of left breast in female, estrogen receptor positive (HCC) 07/09/2022     Problem List Items Addressed This Visit       Digestive   Chronic viral hepatitis B without delta agent and without coma (HCC) - Primary    Sara Browning has chronic inactive Hepatitis B with normal liver function testing and no evidence of cirrhosis. Tolerating tenofovir as suppression and to prevent re-activation with risk of chemotherapy and  immune suppression. Will ask Oncology to check Hepatitis B DNA level with next lab work given her frequent lab draws. Continue current dose of tenofovir for as long as immune suppression remains a possibility. Plan for follow up in 5 months or sooner if needed.         I am having Sara Browning maintain her multivitamin, OVER THE COUNTER MEDICATION, OVER THE COUNTER MEDICATION, Melatonin, loratadine, oxyCODONE, ondansetron, prochlorperazine, Vemlidy,  dexamethasone, magic mouthwash w/lidocaine, and (magic mouthwash (nystatin, lidocaine, diphenhydrAMINE, alum & mag hydroxide) suspension).   Follow-up: Return in about 5 months (around 04/08/2023).   Marcos Eke, MSN, FNP-C Nurse Practitioner [facility] Main number: [phone]

## 2022-11-06 NOTE — Patient Instructions (Addendum)
Nice to see you.  Continue to take your medication daily as prescribed.  Refills at the pharmacy.  Plan for follow up in 5 months or sooner if needed with lab work on the same day.  Have a great day and stay safe!

## 2022-11-07 ENCOUNTER — Other Ambulatory Visit: Payer: Self-pay

## 2022-11-07 ENCOUNTER — Inpatient Hospital Stay: Payer: BC Managed Care – PPO

## 2022-11-07 ENCOUNTER — Ambulatory Visit: Payer: BC Managed Care – PPO

## 2022-11-07 VITALS — BP 113/66 | HR 86

## 2022-11-07 DIAGNOSIS — C50412 Malignant neoplasm of upper-outer quadrant of left female breast: Secondary | ICD-10-CM | POA: Diagnosis not present

## 2022-11-07 DIAGNOSIS — K1230 Oral mucositis (ulcerative), unspecified: Secondary | ICD-10-CM | POA: Diagnosis not present

## 2022-11-07 DIAGNOSIS — Z170 Estrogen receptor positive status [ER+]: Secondary | ICD-10-CM

## 2022-11-07 DIAGNOSIS — Z5189 Encounter for other specified aftercare: Secondary | ICD-10-CM | POA: Diagnosis not present

## 2022-11-07 DIAGNOSIS — K5900 Constipation, unspecified: Secondary | ICD-10-CM | POA: Diagnosis not present

## 2022-11-07 DIAGNOSIS — B181 Chronic viral hepatitis B without delta-agent: Secondary | ICD-10-CM | POA: Diagnosis not present

## 2022-11-07 DIAGNOSIS — R5383 Other fatigue: Secondary | ICD-10-CM | POA: Diagnosis not present

## 2022-11-07 DIAGNOSIS — E860 Dehydration: Secondary | ICD-10-CM | POA: Diagnosis not present

## 2022-11-07 DIAGNOSIS — Z5111 Encounter for antineoplastic chemotherapy: Secondary | ICD-10-CM | POA: Diagnosis not present

## 2022-11-07 MED ORDER — SODIUM CHLORIDE 0.9 % IV SOLN: INTRAVENOUS | Status: DC

## 2022-11-07 MED ORDER — PEGFILGRASTIM-CBQV 6 MG/0.6ML ~~LOC~~ SOSY
6.0000 mg | PREFILLED_SYRINGE | Freq: Once | SUBCUTANEOUS | Status: AC
  Administered 2022-11-07: 6 mg via SUBCUTANEOUS
  Filled 2022-11-07: qty 0.6

## 2022-11-07 NOTE — Patient Instructions (Signed)
Pegfilgrastim Injection What is this medication? PEGFILGRASTIM (PEG fil gra stim) lowers the risk of infection in people who are receiving chemotherapy. It works by Systems analyst make more white blood cells, which protects your body from infection. It may also be used to help people who have been exposed to high doses of radiation. This medicine may be used for other purposes; ask your health care provider or pharmacist if you have questions. COMMON BRAND NAME(S): Cherly Hensen, Neulasta, Nyvepria, Stimufend, UDENYCA, UDENYCA ONBODY, Ziextenzo What should I tell my care team before I take this medication? They need to know if you have any of these conditions: Kidney disease Latex allergy Ongoing radiation therapy Sickle cell disease Skin reactions to acrylic adhesives (On-Body Injector only) An unusual or allergic reaction to pegfilgrastim, filgrastim, other medications, foods, dyes, or preservatives Pregnant or trying to get pregnant Breast-feeding How should I use this medication? This medication is for injection under the skin. If you get this medication at home, you will be taught how to prepare and give the pre-filled syringe or how to use the On-body Injector. Refer to the patient Instructions for Use for detailed instructions. Use exactly as directed. Tell your care team immediately if you suspect that the On-body Injector may not have performed as intended or if you suspect the use of the On-body Injector resulted in a missed or partial dose. It is important that you put your used needles and syringes in a special sharps container. Do not put them in a trash can. If you do not have a sharps container, call your pharmacist or care team to get one. Talk to your care team about the use of this medication in children. While this medication may be prescribed for selected conditions, precautions do apply. Overdosage: If you think you have taken too much of this medicine contact a poison  control center or emergency room at once. NOTE: This medicine is only for you. Do not share this medicine with others. What if I miss a dose? It is important not to miss your dose. Call your care team if you miss your dose. If you miss a dose due to an On-body Injector failure or leakage, a new dose should be administered as soon as possible using a single prefilled syringe for manual use. What may interact with this medication? Interactions have not been studied. This list may not describe all possible interactions. Give your health care provider a list of all the medicines, herbs, non-prescription drugs, or dietary supplements you use. Also tell them if you smoke, drink alcohol, or use illegal drugs. Some items may interact with your medicine. What should I watch for while using this medication? Your condition will be monitored carefully while you are receiving this medication. You may need blood work done while you are taking this medication. Talk to your care team about your risk of cancer. You may be more at risk for certain types of cancer if you take this medication. If you are going to need a MRI, CT scan, or other procedure, tell your care team that you are using this medication (On-Body Injector only). What side effects may I notice from receiving this medication? Side effects that you should report to your care team as soon as possible: Allergic reactions--skin rash, itching, hives, swelling of the face, lips, tongue, or throat Capillary leak syndrome--stomach or muscle pain, unusual weakness or fatigue, feeling faint or lightheaded, decrease in the amount of urine, swelling of the ankles, hands, or  feet, trouble breathing High white blood cell level--fever, fatigue, trouble breathing, night sweats, change in vision, weight loss Inflammation of the aorta--fever, fatigue, back, chest, or stomach pain, severe headache Kidney injury (glomerulonephritis)--decrease in the amount of urine, red  or dark brown urine, foamy or bubbly urine, swelling of the ankles, hands, or feet Shortness of breath or trouble breathing Spleen injury--pain in upper left stomach or shoulder Unusual bruising or bleeding Side effects that usually do not require medical attention (report to your care team if they continue or are bothersome): Bone pain Pain in the hands or feet This list may not describe all possible side effects. Call your doctor for medical advice about side effects. You may report side effects to FDA at 1-800-FDA-1088. Where should I keep my medication? Keep out of the reach of children. If you are using this medication at home, you will be instructed on how to store it. Throw away any unused medication after the expiration date on the label. NOTE: This sheet is a summary. It may not cover all possible information. If you have questions about this medicine, talk to your doctor, pharmacist, or health care provider.  2024 Elsevier/Gold Standard (2021-03-09 00:00:00)  Dehydration, Adult Dehydration is a condition in which there is not enough water or other fluids in the body. This happens when a person loses more fluids than they take in. Important organs, such as the kidneys, brain, and heart, cannot function without a proper amount of fluids. Any loss of fluids from the body can lead to dehydration. Dehydration can be mild, moderate, or severe. It should be treated right away to prevent it from becoming severe. What are the causes? Dehydration may be caused by: Health conditions, such as diarrhea, vomiting, fever, infection, or sweating or urinating a lot. Not drinking enough fluids. Certain medicines, such as medicines that remove excess fluid from the body (diuretics). Lack of safe drinking water. Not being able to get enough water and food. What increases the risk? The following factors may make you more likely to develop this condition: Having a long-term (chronic) illness that has  not been treated properly, such as diabetes, heart disease, or kidney disease. Being 45 years of age or older. Having a disability. Living in a place that is high in altitude, where thinner, drier air causes more fluid loss. Doing exercises that put stress on your body for a long time (endurance sports). Being active in a hot climate. What are the signs or symptoms? Symptoms of dehydration depend on how severe it is. Mild or moderate dehydration Thirst. Dry lips or dry mouth. Dizziness or light-headedness. Muscle cramps. Dark urine. Urine may be the color of tea. Less urine or tears produced than usual. Headache. Severe dehydration Changes in skin. Your skin may be cold and clammy, blotchy, or pale. Your skin also may not return to normal after being lightly pinched and released. Little or no tears, urine, or sweat. Rapid breathing and low blood pressure. Your pulse may be weak or may be faster than 100 beats per minute when you are sitting still. Other changes, such as: Feeling very thirsty. Sunken eyes. Cold hands and feet. Confusion. Being very tired (lethargic) or having trouble waking from sleep. Short-term weight loss. Loss of consciousness. How is this diagnosed? This condition is diagnosed based on your symptoms and a physical exam. You may have blood and urine tests to help confirm the diagnosis. How is this treated? Treatment for this condition depends on how severe it  is. Treatment should be started right away. Do not wait until dehydration becomes severe. Severe dehydration is an emergency and needs to be treated in a hospital. Mild or moderate dehydration can be treated at home. You may be asked to: Drink more fluids. Drink an oral rehydration solution (ORS). This drink restores fluids, salts, and minerals in the blood (electrolytes). Stop any activities that caused dehydration, such as exercise. Cool off with cool compresses, cool mist, or cool fluids, if heat or too  much sweat caused your condition. Take medicine to treat fever, if fever caused your condition. Take medicine to treat nausea and diarrhea, if vomiting or diarrhea caused your condition. Severe dehydration can be treated: With IV fluids. By correcting abnormal levels of electrolytes in your body. By treating the underlying cause of dehydration. Follow these instructions at home: Oral rehydration solution If told by your health care provider, drink an ORS: Make an ORS by following instructions on the package. Start by drinking small amounts, about  cup (120 mL) every 5-10 minutes. Slowly increase how much you drink until you have taken the amount recommended by your health care provider.  Eating and drinking  Drink enough clear fluid to keep your urine pale yellow. If you were told to drink an ORS, finish the ORS first and then start slowly drinking other clear fluids. Drink fluids such as: Water. Do not drink only water. Doing that can lead to hyponatremia, which is having too little salt (sodium) in the body. Water from ice chips you suck on. Diluted fruit juice. This is fruit juice that you have added water to. Low-calorie sports drinks. Eat foods that contain a healthy balance of electrolytes, such as bananas, oranges, potatoes, tomatoes, and spinach. Do not drink alcohol. Avoid the following: Drinks that contain a lot of sugar. These include high-calorie sports drinks, fruit juice that is not diluted, and soda. Caffeine. Foods that are greasy or contain a lot of fat or sugar. General instructions Take over-the-counter and prescription medicines only as told by your health care provider. Do not take sodium tablets. Doing that can lead to having too much sodium in the body (hypernatremia). Return to your normal activities as told by your health care provider. Ask your health care provider what activities are safe for you. Keep all follow-up visits. Your health care provider may need  to check your progress and suggest new ways to treat your condition. Contact a health care provider if: You have muscle cramps, pain, or discomfort, such as: Pain in your abdomen and the pain gets worse or stays in one area. Stiff neck. You have a rash. You are more irritable than usual. You are sleepier or have a harder time waking. You feel weak or dizzy. You feel very thirsty. Get help right away if: You have symptoms of severe dehydration. You vomit every time you eat or drink. Your vomiting gets worse, does not go away, or includes blood or green matter (bile). You are getting treatment but symptoms are getting worse. You have a fever. You have a severe headache. You have: Diarrhea that gets worse or does not go away. Blood in your stool. This may cause stool to look black and tarry. Not urinating, or urinating only a small amount of very dark urine, within 6-8 hours. You have trouble breathing. These symptoms may be an emergency. Get help right away. Do not wait to see if the symptoms will go away. Do not drive yourself to the hospital. Call 911.  This information is not intended to replace advice given to you by your health care provider. Make sure you discuss any questions you have with your health care provider. Document Revised: 11/05/2021 Document Reviewed: 11/05/2021 Elsevier Patient Education  2024 [employer].

## 2022-11-14 ENCOUNTER — Other Ambulatory Visit: Payer: Self-pay | Admitting: *Deleted

## 2022-11-15 ENCOUNTER — Encounter: Payer: Self-pay | Admitting: Hematology and Oncology

## 2022-11-18 ENCOUNTER — Encounter: Payer: Self-pay | Admitting: Hematology and Oncology

## 2022-11-19 ENCOUNTER — Other Ambulatory Visit (HOSPITAL_COMMUNITY): Payer: Self-pay

## 2022-11-20 ENCOUNTER — Other Ambulatory Visit: Payer: Self-pay

## 2022-11-21 ENCOUNTER — Encounter: Payer: Self-pay | Admitting: Hematology and Oncology

## 2022-11-22 ENCOUNTER — Other Ambulatory Visit: Payer: Self-pay | Admitting: *Deleted

## 2022-11-22 ENCOUNTER — Encounter: Payer: Self-pay | Admitting: *Deleted

## 2022-11-22 DIAGNOSIS — C50412 Malignant neoplasm of upper-outer quadrant of left female breast: Secondary | ICD-10-CM

## 2022-11-23 ENCOUNTER — Encounter: Payer: Self-pay | Admitting: Hematology and Oncology

## 2022-11-23 ENCOUNTER — Other Ambulatory Visit (HOSPITAL_COMMUNITY): Payer: Self-pay

## 2022-11-25 ENCOUNTER — Other Ambulatory Visit (HOSPITAL_COMMUNITY): Payer: Self-pay

## 2022-11-25 ENCOUNTER — Encounter: Payer: Self-pay | Admitting: *Deleted

## 2022-11-25 ENCOUNTER — Ambulatory Visit: Payer: BC Managed Care – PPO | Attending: Hematology and Oncology

## 2022-11-25 VITALS — Wt 163.0 lb

## 2022-11-25 DIAGNOSIS — Z483 Aftercare following surgery for neoplasm: Secondary | ICD-10-CM | POA: Insufficient documentation

## 2022-11-25 MED FILL — Dexamethasone Sodium Phosphate Inj 100 MG/10ML: INTRAMUSCULAR | Qty: 1 | Status: AC

## 2022-11-25 NOTE — Therapy (Signed)
  OUTPATIENT PHYSICAL THERAPY SOZO SCREENING NOTE   Patient Name: Sara Browning MRN: [id number] DOB:[birth year], 47 y.o., female Today's Date: 11/25/2022  PCP: [name], NP REFERRING PROVIDER: [name], MD   PT End of Session - 11/25/22 0915     Visit Number 2   # unchanged due to screen only   PT Start Time 0913    PT Stop Time 0917    PT Time Calculation (min) 4 min    Activity Tolerance Patient tolerated treatment well    Behavior During Therapy [facility] for tasks assessed/performed             Past Medical History:  Diagnosis Date   Cancer (HCC) 2024   Left Breat Cancer   Hepatitis    Hep B Carrier   Hepatitis 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)    Pneumonia    Hx   Urticaria    Past Surgical History:  Procedure Laterality Date   BREAST BIOPSY Left 07/30/2022   [place] LT RADIOACTIVE SEED LOC 07/30/2022 [facility]   BREAST LUMPECTOMY WITH RADIOACTIVE SEED AND SENTINEL LYMPH NODE BIOPSY Left 08/01/2022   Procedure: LEFT BREAST LUMPECTOMY WITH RADIOACTIVE SEED AND SENTINEL LYMPH NODE BIOPSY;  Surgeon: Harriette Bouillon, MD;  Location: [facility] OR;  Service: General;  Laterality: Left;   CESAREAN SECTION     twice   Patient Active Problem List   Diagnosis Date Noted   Chronic viral hepatitis B without delta agent and without coma (HCC) 09/25/2022   Genetic testing 07/25/2022   Malignant neoplasm of upper-outer quadrant of left breast in female, estrogen receptor positive (HCC) 07/09/2022    REFERRING DIAG: left breast cancer at risk for lymphedema  THERAPY DIAG:  Aftercare following surgery for neoplasm  PERTINENT HISTORY: left lumpectomy and SLNB on 08/01/22 with 9 negative nodes removed due to grade 3 IDC ER/PR positive Ki67 of 70%.  Will probably do radiation. Hep B carrier   PRECAUTIONS: left UE Lymphedema risk, None  SUBJECTIVE: Pt returns for her first 3 month L-Dex screen.   PAIN:  Are you having pain? No  SOZO SCREENING: Patient was assessed today using the SOZO  machine to determine the lymphedema index score. This was compared to her baseline score. It was determined that she is within the recommended range when compared to her baseline and no further action is needed at this time. She will continue SOZO screenings. These are done every 3 months for 2 years post operatively followed by every 6 months for 2 years, and then annually.   L-DEX FLOWSHEETS - 11/25/22 0900       L-DEX LYMPHEDEMA SCREENING   Measurement Type Unilateral    L-DEX MEASUREMENT EXTREMITY Upper Extremity    POSITION  Standing    DOMINANT SIDE Right    At Risk Side Left    BASELINE SCORE (UNILATERAL) -1.8    L-DEX SCORE (UNILATERAL) 2.9    VALUE CHANGE (UNILAT) 4.7               Hermenia Bers, PTA 11/25/2022, 9:16 AM

## 2022-11-26 ENCOUNTER — Other Ambulatory Visit: Payer: Self-pay

## 2022-11-26 ENCOUNTER — Inpatient Hospital Stay: Payer: BC Managed Care – PPO | Attending: Hematology and Oncology

## 2022-11-26 ENCOUNTER — Other Ambulatory Visit (HOSPITAL_COMMUNITY): Payer: Self-pay

## 2022-11-26 ENCOUNTER — Encounter: Payer: Self-pay | Admitting: Hematology and Oncology

## 2022-11-26 ENCOUNTER — Inpatient Hospital Stay (HOSPITAL_BASED_OUTPATIENT_CLINIC_OR_DEPARTMENT_OTHER): Payer: BC Managed Care – PPO | Admitting: Adult Health

## 2022-11-26 ENCOUNTER — Encounter: Payer: Self-pay | Admitting: Adult Health

## 2022-11-26 ENCOUNTER — Inpatient Hospital Stay: Payer: BC Managed Care – PPO

## 2022-11-26 VITALS — BP 105/67 | HR 69 | Temp 98.4°F | Resp 18

## 2022-11-26 DIAGNOSIS — Z5111 Encounter for antineoplastic chemotherapy: Secondary | ICD-10-CM | POA: Insufficient documentation

## 2022-11-26 DIAGNOSIS — Z803 Family history of malignant neoplasm of breast: Secondary | ICD-10-CM | POA: Diagnosis not present

## 2022-11-26 DIAGNOSIS — K5900 Constipation, unspecified: Secondary | ICD-10-CM | POA: Insufficient documentation

## 2022-11-26 DIAGNOSIS — R5383 Other fatigue: Secondary | ICD-10-CM | POA: Insufficient documentation

## 2022-11-26 DIAGNOSIS — Z170 Estrogen receptor positive status [ER+]: Secondary | ICD-10-CM

## 2022-11-26 DIAGNOSIS — B181 Chronic viral hepatitis B without delta-agent: Secondary | ICD-10-CM | POA: Diagnosis not present

## 2022-11-26 DIAGNOSIS — K1230 Oral mucositis (ulcerative), unspecified: Secondary | ICD-10-CM | POA: Insufficient documentation

## 2022-11-26 DIAGNOSIS — Z5189 Encounter for other specified aftercare: Secondary | ICD-10-CM | POA: Diagnosis not present

## 2022-11-26 DIAGNOSIS — C50412 Malignant neoplasm of upper-outer quadrant of left female breast: Secondary | ICD-10-CM

## 2022-11-26 DIAGNOSIS — Z3202 Encounter for pregnancy test, result negative: Secondary | ICD-10-CM | POA: Insufficient documentation

## 2022-11-26 DIAGNOSIS — Z800 Family history of malignant neoplasm of digestive organs: Secondary | ICD-10-CM | POA: Diagnosis not present

## 2022-11-26 LAB — CMP (CANCER CENTER ONLY)
ALT: 9 U/L (ref 0–44)
AST: 17 U/L (ref 15–41)
Albumin: 4.1 g/dL (ref 3.5–5.0)
Alkaline Phosphatase: 77 U/L (ref 38–126)
Anion gap: 9 (ref 5–15)
BUN: 14 mg/dL (ref 6–20)
CO2: 22 mmol/L (ref 22–32)
Calcium: 9.1 mg/dL (ref 8.9–10.3)
Chloride: 106 mmol/L (ref 98–111)
Creatinine: 0.75 mg/dL (ref 0.44–1.00)
GFR, Estimated: >60 mL/min (ref >60)
Glucose, Bld: 150 mg/dL — ABNORMAL HIGH (ref 70–99)
Potassium: 4 mmol/L (ref 3.5–5.1)
Sodium: 137 mmol/L (ref 135–145)
Total Bilirubin: 0.4 mg/dL (ref 0.3–1.2)
Total Protein: 6.6 g/dL (ref 6.5–8.1)

## 2022-11-26 LAB — CBC WITH DIFFERENTIAL (CANCER CENTER ONLY)
Abs Immature Granulocytes: 0.07 10*3/uL (ref 0.00–0.07)
Basophils Absolute: 0 10*3/uL (ref 0.0–0.1)
Basophils Relative: 0 %
Eosinophils Absolute: 0 10*3/uL (ref 0.0–0.5)
Eosinophils Relative: 0 %
HCT: 34.2 % — ABNORMAL LOW (ref 36.0–46.0)
Hemoglobin: 12.2 g/dL (ref 12.0–15.0)
Immature Granulocytes: 1 %
Lymphocytes Relative: 8 %
Lymphs Abs: 1 10*3/uL (ref 0.7–4.0)
MCH: 30.4 pg (ref 26.0–34.0)
MCHC: 35.7 g/dL (ref 30.0–36.0)
MCV: 85.3 fL (ref 80.0–100.0)
Monocytes Absolute: 0.8 10*3/uL (ref 0.1–1.0)
Monocytes Relative: 7 %
Neutro Abs: 10.2 10*3/uL — ABNORMAL HIGH (ref 1.7–7.7)
Neutrophils Relative %: 84 %
Platelet Count: 249 10*3/uL (ref 150–400)
RBC: 4.01 MIL/uL (ref 3.87–5.11)
RDW: 14.2 % (ref 11.5–15.5)
WBC Count: 12.1 10*3/uL — ABNORMAL HIGH (ref 4.0–10.5)
nRBC: 0 % (ref 0.0–0.2)

## 2022-11-26 LAB — PREGNANCY, URINE: Preg Test, Ur: NEGATIVE

## 2022-11-26 MED ORDER — SODIUM CHLORIDE 0.9 % IV SOLN: Freq: Once | INTRAVENOUS | Status: AC

## 2022-11-26 MED ORDER — SODIUM CHLORIDE 0.9 % IV SOLN
600.0000 mg/m2 | Freq: Once | INTRAVENOUS | Status: AC
  Administered 2022-11-26: 1000 mg via INTRAVENOUS
  Filled 2022-11-26: qty 50

## 2022-11-26 MED ORDER — SODIUM CHLORIDE 0.9 % IV SOLN
75.0000 mg/m2 | Freq: Once | INTRAVENOUS | Status: AC
  Administered 2022-11-26: 133 mg via INTRAVENOUS
  Filled 2022-11-26: qty 13.05

## 2022-11-26 MED ORDER — SODIUM CHLORIDE 0.9 % IV SOLN
10.0000 mg | Freq: Once | INTRAVENOUS | Status: AC
  Administered 2022-11-26: 10 mg via INTRAVENOUS
  Filled 2022-11-26: qty 10

## 2022-11-26 MED ORDER — HEPARIN SOD (PORK) LOCK FLUSH 100 UNIT/ML IV SOLN: 500.0000 [IU] | Freq: Once | INTRAVENOUS | Status: DC | PRN

## 2022-11-26 MED ORDER — SODIUM CHLORIDE 0.9% FLUSH: 10.0000 mL | INTRAVENOUS | Status: DC | PRN

## 2022-11-26 MED ORDER — PALONOSETRON HCL INJECTION 0.25 MG/5ML
0.2500 mg | Freq: Once | INTRAVENOUS | Status: AC
  Administered 2022-11-26: 0.25 mg via INTRAVENOUS
  Filled 2022-11-26: qty 5

## 2022-11-26 NOTE — Assessment & Plan Note (Signed)
Sara Browning is a 47 year old woman with history of stage IB ER/PR positive breast cancer s/p lumpectomy, now receiving adjuvant chemotherapy with taxotere and cytoxan here today for f/u after receiving her first cycle last week.   Treatment Plan:  Lumpectomy Adjuvant chemotherapy Adjuvant radiation Antiestrogen therapy  Current Treatment: Taxotere and Cytoxan cycle 3-day 1  Dehydration: IV fluids have been ordered for day 3 which has helped her tremendously.  These are not scheduled with this next 2 cycles and so I sent a message to our schedulers today to get this fixed. Body pain: This is related to her Udenyca.  Has oxycodone PRN, she hasn't tried this, however I recommended she do so if needed and let me know if it works for her significant pain.   Mucositis: Magic Mouthwash and oral cryotherapy during chemotherapy. Constipation: Managed with papaya  Profound fatigue: Energy conservation  Sara Browning proceed with treatment today.  Her labs are stable.  She will not need any dose reductions with this cycle.  She is not experiencing any peripheral neuropathy.  We will see her back in 2 days for her injection and IV fluids and in 3 weeks for labs, follow-up, and cycle 4 of her Taxotere and Cytoxan therapy.

## 2022-11-26 NOTE — Progress Notes (Signed)
[facility] Cancer Follow up:    Sara Morales, NP [address] [place] [address]   DIAGNOSIS:  Cancer Staging  Malignant neoplasm of upper-outer quadrant of left breast in female, estrogen receptor positive (HCC) Staging form: Breast, AJCC 8th Edition - Clinical: Stage IIA (cT2, cN0, cM0, G3, ER+, PR+, HER2-) - Signed by [name], MD on 07/09/2022 Histologic grading system: 3 grade system - Pathologic: Stage IB (pT2, pN0, cM0, G3, ER+, PR+, HER2-) - Signed by [name], NP on 10/15/2022 Histologic grading system: 3 grade system   SUMMARY OF ONCOLOGIC HISTORY: Oncology History  Malignant neoplasm of upper-outer quadrant of left breast in female, estrogen receptor positive (HCC)  07/09/2022 Initial Diagnosis   Malignant neoplasm of upper-outer quadrant of left breast in female, estrogen receptor positive (HCC)   07/09/2022 Cancer Staging   Staging form: Breast, AJCC 8th Edition - Clinical: Stage IIA (cT2, cN0, cM0, G3, ER+, PR+, HER2-) - Signed by [name], MD on 07/09/2022 Histologic grading system: 3 grade system    Genetic Testing   [facility] Multi-Cancer Panel+RNA was Negative. Of note, a variant of uncertain significance was detected in the HOXB13 gene (c.208C>T) and MUTYH gene (c.1547C>T). Report date is 07/24/2022.  The Multi-Cancer + RNA Panel offered by [facility] includes sequencing and/or deletion/duplication analysis of the following 70 genes:  AIP*, ALK, APC*, ATM*, AXIN2*, BAP1*, BARD1*, BLM*, BMPR1A*, BRCA1*, BRCA2*, BRIP1*, CDC73*, CDH1*, CDK4, CDKN1B*, CDKN2A, CHEK2*, CTNNA1*, DICER1*, EPCAM (del/dup only), EGFR, FH*, FLCN*, GREM1 (promoter dup only), HOXB13, KIT, LZTR1, MAX*, MBD4, MEN1*, MET, MITF, MLH1*, MSH2*, MSH3*, MSH6*, MUTYH*, NF1*, NF2*, NTHL1*, PALB2*, PDGFRA, PMS2*, POLD1*, POLE*, POT1*, PRKAR1A*, PTCH1*, PTEN*, RAD51C*, RAD51D*, RB1*, RET, SDHA* (sequencing only), SDHAF2*, SDHB*, SDHC*, SDHD*, SMAD4*, SMARCA4*, SMARCB1*,  SMARCE1*, STK11*, SUFU*, TMEM127*, TP53*, TSC1*, TSC2*, VHL*. RNA analysis is performed for * genes.   08/01/2022 Surgery   Left breast Lumpectomy: IDC, 3.1cm, grade 3, margins negative, no LVI, ER/PR+, Ki-67 70%, HER2-.  9 SLN negative.   08/01/2022 Oncotype testing   24/10%, indicating approximate 6.5% CT benefit. She was not a candidate for OFSET trial because of Hep C status   10/15/2022 -  Chemotherapy   Patient is on Treatment Plan : BREAST TC q21d     10/15/2022 Cancer Staging   Staging form: Breast, AJCC 8th Edition - Pathologic: Stage IB (pT2, pN0, cM0, G3, ER+, PR+, HER2-) - Signed by [name], NP on 10/15/2022 Histologic grading system: 3 grade system     CURRENT THERAPY: Taxotere/Cytoxan  INTERVAL HISTORY: Sara Browning 47 y.o. female returns for follow-up and evaluation prior to receiving her third cycle of Taxotere and Cytoxan adjuvant chemotherapy.  She is tolerating treatment well.  She started receiving IV fluids on day 3 of her therapy and this has been tremendously helpful for the fatigue that she is experience.  She also started eating ice with chemotherapy and this helped with the mucositis.  She is needing a refill of her Magic mouthwash and wants [place] to place orders for this today.  Experiencing mild constipation that she manages with papaya.   Patient Active Problem List   Diagnosis Date Noted   Chronic viral hepatitis B without delta agent and without coma (HCC) 09/25/2022   Genetic testing 07/25/2022   Malignant neoplasm of upper-outer quadrant of left breast in female, estrogen receptor positive (HCC) 07/09/2022    has No Known Allergies.  MEDICAL HISTORY: Past Medical History:  Diagnosis Date   Cancer (HCC)  2024   Left Breat Cancer   Hepatitis    Hep B Carrier   Hepatitis 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)    Pneumonia    Hx   Urticaria     SURGICAL HISTORY: Past Surgical History:  Procedure Laterality Date   BREAST BIOPSY Left 07/30/2022   [place] LT  RADIOACTIVE SEED LOC 07/30/2022 [facility]   BREAST LUMPECTOMY WITH RADIOACTIVE SEED AND SENTINEL LYMPH NODE BIOPSY Left 08/01/2022   Procedure: LEFT BREAST LUMPECTOMY WITH RADIOACTIVE SEED AND SENTINEL LYMPH NODE BIOPSY;  Surgeon: Harriette Bouillon, MD;  Location: [facility] OR;  Service: General;  Laterality: Left;   CESAREAN SECTION     twice    SOCIAL HISTORY: Social History   Socioeconomic History   Marital status: Married    Spouse name: Not on file   Number of children: Not on file   Years of education: Not on file   Highest education level: Not on file  Occupational History   Not on file  Tobacco Use   Smoking status: Never   Smokeless tobacco: Never   Tobacco comments:    Drinks seldom  Vaping Use   Vaping status: Never Used  Substance and Sexual Activity   Alcohol use: Not Currently    Comment: occasionally   Drug use: Never   Sexual activity: Yes  Other Topics Concern   Not on file  Social History Narrative   Not on file   Social Determinants of Health   Financial Resource Strain: Not on file  Food Insecurity: No Food Insecurity (07/19/2022)   Hunger Vital Sign    Worried About Running Out of Food in the Last Year: Never true    Ran Out of Food in the Last Year: Never true  Transportation Needs: No Transportation Needs (07/19/2022)   PRAPARE - Administrator, Civil Service (Medical): No    Lack of Transportation (Non-Medical): No  Physical Activity: Not on file  Stress: Not on file  Social Connections: Not on file  Intimate Partner Violence: Not on file    FAMILY HISTORY: Family History  Problem Relation Age of Onset   Liver cancer Maternal Aunt    Breast cancer Maternal Grandmother    Hypertension Paternal Grandmother    Diabetes Paternal Grandfather     Review of Systems  Constitutional:  Positive for fatigue. Negative for appetite change, chills, fever and unexpected weight change.  HENT:   Negative for hearing loss, lump/mass and  trouble swallowing.   Eyes:  Negative for eye problems and icterus.  Respiratory:  Negative for chest tightness, cough and shortness of breath.   Cardiovascular:  Negative for chest pain, leg swelling and palpitations.  Gastrointestinal:  Positive for constipation. Negative for abdominal distention, abdominal pain, diarrhea, nausea and vomiting.  Endocrine: Negative for hot flashes.  Genitourinary:  Negative for difficulty urinating.   Musculoskeletal:  Negative for arthralgias.  Skin:  Negative for itching and rash.  Neurological:  Negative for dizziness, extremity weakness, headaches and numbness.  Hematological:  Negative for adenopathy. Does not bruise/bleed easily.  Psychiatric/Behavioral:  Negative for depression. The patient is not nervous/anxious.       PHYSICAL EXAMINATION   Onc Performance Status - 11/26/22 1200       KPS SCALE   KPS % SCORE Able to carry on normal activity, minor s/s of disease             Vitals:   11/26/22 1130  BP: 105/62  Pulse: 94  Resp: 18  Temp: 97.8 F (36.6 C)  SpO2: 100%    Physical Exam  LABORATORY DATA:  CBC    Component Value Date/Time   WBC 12.1 (H) 11/26/2022 1117   WBC 7.0 07/31/2022 0925   RBC 4.01 11/26/2022 1117   HGB 12.2 11/26/2022 1117   HGB 14.9 03/28/2020 1118   HCT 34.2 (L) 11/26/2022 1117   HCT 43.6 03/28/2020 1118   PLT 249 11/26/2022 1117   PLT 257 03/28/2020 1118   MCV 85.3 11/26/2022 1117   MCV 84 03/28/2020 1118   MCH 30.4 11/26/2022 1117   MCHC 35.7 11/26/2022 1117   RDW 14.2 11/26/2022 1117   RDW 11.9 03/28/2020 1118   LYMPHSABS 1.0 11/26/2022 1117   LYMPHSABS 2.5 03/28/2020 1118   MONOABS 0.8 11/26/2022 1117   EOSABS 0.0 11/26/2022 1117   EOSABS 0.1 03/28/2020 1118   BASOSABS 0.0 11/26/2022 1117   BASOSABS 0.0 03/28/2020 1118    CMP     Component Value Date/Time   NA 137 11/26/2022 1117   NA 136 03/28/2020 1118   K 4.0 11/26/2022 1117   CL 106 11/26/2022 1117   CO2 22 11/26/2022  1117   GLUCOSE 150 (H) 11/26/2022 1117   BUN 14 11/26/2022 1117   BUN 11 03/28/2020 1118   CREATININE 0.75 11/26/2022 1117   CALCIUM 9.1 11/26/2022 1117   PROT 6.6 11/26/2022 1117   PROT 7.5 03/28/2020 1118   ALBUMIN 4.1 11/26/2022 1117   ALBUMIN 4.3 03/28/2020 1118   AST 17 11/26/2022 1117   ALT 9 11/26/2022 1117   ALT 12 09/24/2022 1523   ALKPHOS 77 11/26/2022 1117   BILITOT 0.4 11/26/2022 1117   GFRNONAA >60 11/26/2022 1117   GFRAA 102 03/28/2020 1118      ASSESSMENT and THERAPY PLAN:   Malignant neoplasm of upper-outer quadrant of left breast in female, estrogen receptor positive (HCC) Sara Browning is a 47 year old woman with history of stage IB ER/PR positive breast cancer s/p lumpectomy, now receiving adjuvant chemotherapy with taxotere and cytoxan here today for f/u after receiving her first cycle last week.   Treatment Plan:  Lumpectomy Adjuvant chemotherapy Adjuvant radiation Antiestrogen therapy  Current Treatment: Taxotere and Cytoxan cycle 3-day 1  Dehydration: IV fluids have been ordered for day 3 which has helped her tremendously.  These are not scheduled with this next 2 cycles and so I sent a message to our schedulers today to get this fixed. Body pain: This is related to her Udenyca.  Has oxycodone PRN, she hasn't tried this, however I recommended she do so if needed and let me know if it works for her significant pain.   Mucositis: Magic Mouthwash and oral cryotherapy during chemotherapy. Constipation: Managed with papaya  Profound fatigue: Energy conservation  Sara Browning proceed with treatment today.  Her labs are stable.  She will not need any dose reductions with this cycle.  She is not experiencing any peripheral neuropathy.  We will see her back in 2 days for her injection and IV fluids and in 3 weeks for labs, follow-up, and cycle 4 of her Taxotere and Cytoxan therapy.     All questions were answered. The patient knows to call the clinic with any  problems, questions or concerns. We can certainly see the patient much sooner if necessary.  Total encounter time:30 minutes*in face-to-face visit time, chart review, lab review, care coordination, order entry, and documentation of the encounter time.    Lillard Anes, NP 11/26/22 12:19 PM Medical Oncology and  Hematology [facility] [address] [place], [place] [address] Tel. [phone]    Fax. [phone]  *Total Encounter Time as defined by the Centers for Medicare and Medicaid Services includes, in addition to the face-to-face time of a patient visit (documented in the note above) non-face-to-face time: obtaining and reviewing outside history, ordering and reviewing medications, tests or procedures, care coordination (communications with other health care professionals or caregivers) and documentation in the medical record.

## 2022-11-27 ENCOUNTER — Telehealth: Payer: Self-pay | Admitting: Adult Health

## 2022-11-27 NOTE — Telephone Encounter (Signed)
Patient has been made aware of updated times/dates for upcoming appointments

## 2022-11-27 NOTE — Progress Notes (Signed)
Faxed completed Healthcare Provider Questionnaire to [employer] at [phone] with receipt confirmation in regards to Pt wearing a head cover while at work.

## 2022-11-28 ENCOUNTER — Other Ambulatory Visit: Payer: Self-pay

## 2022-11-28 ENCOUNTER — Inpatient Hospital Stay: Payer: BC Managed Care – PPO

## 2022-11-28 ENCOUNTER — Ambulatory Visit: Payer: BC Managed Care – PPO

## 2022-11-28 VITALS — BP 121/68 | HR 86 | Temp 97.9°F | Resp 18 | Wt 163.3 lb

## 2022-11-28 DIAGNOSIS — R5383 Other fatigue: Secondary | ICD-10-CM | POA: Diagnosis not present

## 2022-11-28 DIAGNOSIS — Z803 Family history of malignant neoplasm of breast: Secondary | ICD-10-CM | POA: Diagnosis not present

## 2022-11-28 DIAGNOSIS — Z170 Estrogen receptor positive status [ER+]: Secondary | ICD-10-CM | POA: Diagnosis not present

## 2022-11-28 DIAGNOSIS — Z5111 Encounter for antineoplastic chemotherapy: Secondary | ICD-10-CM | POA: Diagnosis not present

## 2022-11-28 DIAGNOSIS — K1230 Oral mucositis (ulcerative), unspecified: Secondary | ICD-10-CM | POA: Diagnosis not present

## 2022-11-28 DIAGNOSIS — B181 Chronic viral hepatitis B without delta-agent: Secondary | ICD-10-CM | POA: Diagnosis not present

## 2022-11-28 DIAGNOSIS — C50412 Malignant neoplasm of upper-outer quadrant of left female breast: Secondary | ICD-10-CM

## 2022-11-28 DIAGNOSIS — Z5189 Encounter for other specified aftercare: Secondary | ICD-10-CM | POA: Diagnosis not present

## 2022-11-28 DIAGNOSIS — Z800 Family history of malignant neoplasm of digestive organs: Secondary | ICD-10-CM | POA: Diagnosis not present

## 2022-11-28 DIAGNOSIS — Z3202 Encounter for pregnancy test, result negative: Secondary | ICD-10-CM | POA: Diagnosis not present

## 2022-11-28 DIAGNOSIS — K5900 Constipation, unspecified: Secondary | ICD-10-CM | POA: Diagnosis not present

## 2022-11-28 MED ORDER — PEGFILGRASTIM-CBQV 6 MG/0.6ML ~~LOC~~ SOSY
6.0000 mg | PREFILLED_SYRINGE | Freq: Once | SUBCUTANEOUS | Status: AC
  Administered 2022-11-28: 6 mg via SUBCUTANEOUS

## 2022-11-28 MED ORDER — SODIUM CHLORIDE 0.9 % IV SOLN: INTRAVENOUS | Status: AC

## 2022-11-28 NOTE — Patient Instructions (Signed)
Pegfilgrastim Injection What is this medication? PEGFILGRASTIM (PEG fil gra stim) lowers the risk of infection in people who are receiving chemotherapy. It works by Systems analyst make more white blood cells, which protects your body from infection. It may also be used to help people who have been exposed to high doses of radiation. This medicine may be used for other purposes; ask your health care provider or pharmacist if you have questions. COMMON BRAND NAME(S): Cherly Hensen, Neulasta, Nyvepria, Stimufend, UDENYCA, UDENYCA ONBODY, Ziextenzo What should I tell my care team before I take this medication? They need to know if you have any of these conditions: Kidney disease Latex allergy Ongoing radiation therapy Sickle cell disease Skin reactions to acrylic adhesives (On-Body Injector only) An unusual or allergic reaction to pegfilgrastim, filgrastim, other medications, foods, dyes, or preservatives Pregnant or trying to get pregnant Breast-feeding How should I use this medication? This medication is for injection under the skin. If you get this medication at home, you will be taught how to prepare and give the pre-filled syringe or how to use the On-body Injector. Refer to the patient Instructions for Use for detailed instructions. Use exactly as directed. Tell your care team immediately if you suspect that the On-body Injector may not have performed as intended or if you suspect the use of the On-body Injector resulted in a missed or partial dose. It is important that you put your used needles and syringes in a special sharps container. Do not put them in a trash can. If you do not have a sharps container, call your pharmacist or care team to get one. Talk to your care team about the use of this medication in children. While this medication may be prescribed for selected conditions, precautions do apply. Overdosage: If you think you have taken too much of this medicine contact a poison  control center or emergency room at once. NOTE: This medicine is only for you. Do not share this medicine with others. What if I miss a dose? It is important not to miss your dose. Call your care team if you miss your dose. If you miss a dose due to an On-body Injector failure or leakage, a new dose should be administered as soon as possible using a single prefilled syringe for manual use. What may interact with this medication? Interactions have not been studied. This list may not describe all possible interactions. Give your health care provider a list of all the medicines, herbs, non-prescription drugs, or dietary supplements you use. Also tell them if you smoke, drink alcohol, or use illegal drugs. Some items may interact with your medicine. What should I watch for while using this medication? Your condition will be monitored carefully while you are receiving this medication. You may need blood work done while you are taking this medication. Talk to your care team about your risk of cancer. You may be more at risk for certain types of cancer if you take this medication. If you are going to need a MRI, CT scan, or other procedure, tell your care team that you are using this medication (On-Body Injector only). What side effects may I notice from receiving this medication? Side effects that you should report to your care team as soon as possible: Allergic reactions--skin rash, itching, hives, swelling of the face, lips, tongue, or throat Capillary leak syndrome--stomach or muscle pain, unusual weakness or fatigue, feeling faint or lightheaded, decrease in the amount of urine, swelling of the ankles, hands, or  feet, trouble breathing High white blood cell level--fever, fatigue, trouble breathing, night sweats, change in vision, weight loss Inflammation of the aorta--fever, fatigue, back, chest, or stomach pain, severe headache Kidney injury (glomerulonephritis)--decrease in the amount of urine, red  or dark brown urine, foamy or bubbly urine, swelling of the ankles, hands, or feet Shortness of breath or trouble breathing Spleen injury--pain in upper left stomach or shoulder Unusual bruising or bleeding Side effects that usually do not require medical attention (report to your care team if they continue or are bothersome): Bone pain Pain in the hands or feet This list may not describe all possible side effects. Call your doctor for medical advice about side effects. You may report side effects to FDA at 1-800-FDA-1088. Where should I keep my medication? Keep out of the reach of children. If you are using this medication at home, you will be instructed on how to store it. Throw away any unused medication after the expiration date on the label. NOTE: This sheet is a summary. It may not cover all possible information. If you have questions about this medicine, talk to your doctor, pharmacist, or health care provider.  2024 Elsevier/Gold Standard (2021-03-09 00:00:00) Rehydration, Adult Rehydration is the replacement of fluids, salts, and minerals in the body (electrolytes) that are lost during dehydration. Dehydration is when there is not enough water or other fluids in the body. This happens when you lose more fluids than you take in. Common causes of dehydration include: Not drinking enough fluids. This can occur when you are ill or doing activities that require a lot of energy, especially in hot weather. Conditions that cause loss of water or other fluids. These include diarrhea, vomiting, sweating, and urinating a lot. Other illnesses, such as fever or infection. Certain medicines, such as those that remove excess fluid from the body (diuretics). Symptoms of mild or moderate dehydration may include thirst, dry lips and mouth, and dizziness. Symptoms of severe dehydration may include increased heart rate, confusion, fainting, and not urinating. In severe cases, you may need to get fluids  through an IV at the hospital. For mild or moderate cases, you can usually rehydrate at home by drinking certain fluids as told by your health care provider. What are the risks? Your health care provider will talk with you about risks. Your health care provider will talk with you about risks. This may include taking in too much fluid (overhydration). This is rare. Overhydration can cause an imbalance of electrolytes in the body, kidney failure, or a decrease in salt (sodium) levels in the body. Supplies needed: You will need an oral rehydration solution (ORS) if your health care provider tells you to use one. This is a drink to treat dehydration. It can be found in pharmacies and retail stores. How to rehydrate Fluids Follow instructions from your health care provider about what to drink. The kind of fluid and the amount you should drink depend on your condition. In general, you should choose drinks that you prefer. If told by your health care provider, drink an ORS. Make an ORS by following instructions on the package. Start by drinking small amounts, about  cup (120 mL) every 5-10 minutes. Slowly increase how much you drink until you have taken in the amount recommended by your health care provider. Drink enough clear fluids to keep your urine pale yellow. If you were told to drink an ORS, finish it first, then start slowly drinking other clear fluids. Drink fluids such as: Water.  This includes sparkling and flavored water. Drinking only water can lead to having too little sodium in your body (hyponatremia). Follow the advice of your health care provider. Water from ice chips you suck on. Fruit juice with water added to it (diluted). Sports drinks. Hot or cold herbal teas. Broth-based soups. Milk or milk products. Food Follow instructions from your health care provider about what to eat while you rehydrate. Your health care provider may recommend that you slowly begin eating regular foods in  small amounts. Eat foods that contain a healthy balance of electrolytes, such as bananas, oranges, potatoes, tomatoes, and spinach. Avoid foods that are greasy or contain a lot of sugar. In some cases, you may get nutrition through a feeding tube that is passed through your nose and into your stomach (nasogastric tube, or NG tube). This may be done if you have uncontrolled vomiting or diarrhea. Drinks to avoid  Certain drinks may make dehydration worse. While you rehydrate, avoid drinking alcohol. How to tell if you are recovering from dehydration You may be getting better if: You are urinating more often than before you started rehydrating. Your urine is pale yellow. Your energy level improves. You vomit less often. You have diarrhea less often. Your appetite improves or returns to normal. You feel less dizzy or light-headed. Your skin tone and color start to look more normal. Follow these instructions at home: Take over-the-counter and prescription medicines only as told by your health care provider. Do not take sodium tablets. Doing this can lead to having too much sodium in your body (hypernatremia). Contact a health care provider if: You continue to have symptoms of mild or moderate dehydration, such as: Thirst. Dry lips. Slightly dry mouth. Dizziness. Dark urine or less urine than normal. Muscle cramps. You continue to vomit or have diarrhea. Get help right away if: You have symptoms of dehydration that get worse. You have a fever. You have a severe headache. You have been vomiting and have problems, such as: Your vomiting gets worse or does not go away. Your vomit includes blood or green matter (bile). You cannot eat or drink without vomiting. You have problems with urination or bowel movements, such as: Diarrhea that gets worse or does not go away. Blood in your stool (feces). This may cause stool to look black and tarry. Not urinating, or urinating only a small amount  of very dark urine, within 6-8 hours. You have trouble breathing. You have symptoms that get worse with treatment. These symptoms may be an emergency. Get help right away. Call 911. Do not wait to see if the symptoms will go away. Do not drive yourself to the hospital. This information is not intended to replace advice given to you by your health care provider. Make sure you discuss any questions you have with your health care provider. Document Revised: 08/20/2021 Document Reviewed: 08/20/2021 Elsevier Patient Education  2024 [employer].

## 2022-12-05 ENCOUNTER — Telehealth: Payer: Self-pay | Admitting: *Deleted

## 2022-12-05 NOTE — Telephone Encounter (Signed)
This RN spoke with patient per her call stating onset of "hearing my heartbeat in my right ear "- she looked up the symptoms and it suggested possible ear infection.  Note she denies any fevers,congestion or pain in her right ear.  Per review noted drop in heme by 2 gms over the past month- discussed possible change reflecting in her symptoms.  She is rehydrating primarily with water.  Discussed need to institute use of sugar and salt in her intake to help with hydration.  She has liquid IV packets (non diet) and will try using them.  She is having decreased diet and discussed use of calorie rich foods for intake.  No further needs at this time.

## 2022-12-15 ENCOUNTER — Encounter: Payer: Self-pay | Admitting: Hematology and Oncology

## 2022-12-16 ENCOUNTER — Encounter: Payer: Self-pay | Admitting: Hematology and Oncology

## 2022-12-16 NOTE — Progress Notes (Signed)
Erroneous encounter

## 2022-12-17 ENCOUNTER — Encounter: Payer: Self-pay | Admitting: *Deleted

## 2022-12-17 DIAGNOSIS — C50412 Malignant neoplasm of upper-outer quadrant of left female breast: Secondary | ICD-10-CM

## 2022-12-17 MED FILL — Dexamethasone Sodium Phosphate Inj 100 MG/10ML: INTRAMUSCULAR | Qty: 1 | Status: AC

## 2022-12-18 ENCOUNTER — Inpatient Hospital Stay: Payer: BC Managed Care – PPO

## 2022-12-18 ENCOUNTER — Other Ambulatory Visit (HOSPITAL_COMMUNITY): Payer: Self-pay

## 2022-12-18 ENCOUNTER — Inpatient Hospital Stay (HOSPITAL_BASED_OUTPATIENT_CLINIC_OR_DEPARTMENT_OTHER): Payer: BC Managed Care – PPO | Admitting: Hematology and Oncology

## 2022-12-18 ENCOUNTER — Telehealth: Payer: Self-pay | Admitting: Radiation Oncology

## 2022-12-18 VITALS — BP 128/69 | HR 90 | Resp 20

## 2022-12-18 DIAGNOSIS — Z800 Family history of malignant neoplasm of digestive organs: Secondary | ICD-10-CM | POA: Diagnosis not present

## 2022-12-18 DIAGNOSIS — Z170 Estrogen receptor positive status [ER+]: Secondary | ICD-10-CM

## 2022-12-18 DIAGNOSIS — K5900 Constipation, unspecified: Secondary | ICD-10-CM | POA: Diagnosis not present

## 2022-12-18 DIAGNOSIS — Z5111 Encounter for antineoplastic chemotherapy: Secondary | ICD-10-CM | POA: Diagnosis not present

## 2022-12-18 DIAGNOSIS — B181 Chronic viral hepatitis B without delta-agent: Secondary | ICD-10-CM | POA: Diagnosis not present

## 2022-12-18 DIAGNOSIS — C50412 Malignant neoplasm of upper-outer quadrant of left female breast: Secondary | ICD-10-CM

## 2022-12-18 DIAGNOSIS — Z5189 Encounter for other specified aftercare: Secondary | ICD-10-CM | POA: Diagnosis not present

## 2022-12-18 DIAGNOSIS — Z3202 Encounter for pregnancy test, result negative: Secondary | ICD-10-CM | POA: Diagnosis not present

## 2022-12-18 DIAGNOSIS — R5383 Other fatigue: Secondary | ICD-10-CM | POA: Diagnosis not present

## 2022-12-18 DIAGNOSIS — Z803 Family history of malignant neoplasm of breast: Secondary | ICD-10-CM | POA: Diagnosis not present

## 2022-12-18 DIAGNOSIS — K1230 Oral mucositis (ulcerative), unspecified: Secondary | ICD-10-CM | POA: Diagnosis not present

## 2022-12-18 LAB — CMP (CANCER CENTER ONLY)
ALT: 6 U/L (ref 0–44)
AST: 14 U/L — ABNORMAL LOW (ref 15–41)
Albumin: 4 g/dL (ref 3.5–5.0)
Alkaline Phosphatase: 61 U/L (ref 38–126)
Anion gap: 8 (ref 5–15)
BUN: 20 mg/dL (ref 6–20)
CO2: 26 mmol/L (ref 22–32)
Calcium: 9.1 mg/dL (ref 8.9–10.3)
Chloride: 105 mmol/L (ref 98–111)
Creatinine: 0.81 mg/dL (ref 0.44–1.00)
GFR, Estimated: >60 mL/min (ref >60)
Glucose, Bld: 103 mg/dL — ABNORMAL HIGH (ref 70–99)
Potassium: 3.9 mmol/L (ref 3.5–5.1)
Sodium: 139 mmol/L (ref 135–145)
Total Bilirubin: 0.4 mg/dL (ref 0.3–1.2)
Total Protein: 6.8 g/dL (ref 6.5–8.1)

## 2022-12-18 LAB — CBC WITH DIFFERENTIAL (CANCER CENTER ONLY)
Abs Immature Granulocytes: 0.07 10*3/uL (ref 0.00–0.07)
Basophils Absolute: 0 10*3/uL (ref 0.0–0.1)
Basophils Relative: 0 %
Eosinophils Absolute: 0 10*3/uL (ref 0.0–0.5)
Eosinophils Relative: 0 %
HCT: 32.8 % — ABNORMAL LOW (ref 36.0–46.0)
Hemoglobin: 10.9 g/dL — ABNORMAL LOW (ref 12.0–15.0)
Immature Granulocytes: 1 %
Lymphocytes Relative: 13 %
Lymphs Abs: 1.5 10*3/uL (ref 0.7–4.0)
MCH: 29.5 pg (ref 26.0–34.0)
MCHC: 33.2 g/dL (ref 30.0–36.0)
MCV: 88.9 fL (ref 80.0–100.0)
Monocytes Absolute: 1.3 10*3/uL — ABNORMAL HIGH (ref 0.1–1.0)
Monocytes Relative: 12 %
Neutro Abs: 8.1 10*3/uL — ABNORMAL HIGH (ref 1.7–7.7)
Neutrophils Relative %: 74 %
Platelet Count: 331 10*3/uL (ref 150–400)
RBC: 3.69 MIL/uL — ABNORMAL LOW (ref 3.87–5.11)
RDW: 15.9 % — ABNORMAL HIGH (ref 11.5–15.5)
WBC Count: 11 10*3/uL — ABNORMAL HIGH (ref 4.0–10.5)
nRBC: 0.2 % (ref 0.0–0.2)

## 2022-12-18 LAB — PREGNANCY, URINE: Preg Test, Ur: NEGATIVE

## 2022-12-18 MED ORDER — PALONOSETRON HCL INJECTION 0.25 MG/5ML
0.2500 mg | Freq: Once | INTRAVENOUS | Status: AC
  Administered 2022-12-18: 0.25 mg via INTRAVENOUS
  Filled 2022-12-18: qty 5

## 2022-12-18 MED ORDER — SODIUM CHLORIDE 0.9 % IV SOLN
75.0000 mg/m2 | Freq: Once | INTRAVENOUS | Status: AC
  Administered 2022-12-18: 133 mg via INTRAVENOUS
  Filled 2022-12-18: qty 13.3

## 2022-12-18 MED ORDER — SODIUM CHLORIDE 0.9 % IV SOLN: Freq: Once | INTRAVENOUS | Status: AC

## 2022-12-18 MED ORDER — SODIUM CHLORIDE 0.9 % IV SOLN
10.0000 mg | Freq: Once | INTRAVENOUS | Status: AC
  Administered 2022-12-18: 10 mg via INTRAVENOUS
  Filled 2022-12-18: qty 10

## 2022-12-18 MED ORDER — SODIUM CHLORIDE 0.9 % IV SOLN
600.0000 mg/m2 | Freq: Once | INTRAVENOUS | Status: AC
  Administered 2022-12-18: 1000 mg via INTRAVENOUS
  Filled 2022-12-18: qty 50

## 2022-12-18 NOTE — Progress Notes (Signed)
[facility] Cancer Follow up:    Sara Morales, NP [address] [place] [address]   DIAGNOSIS:  Cancer Staging  Malignant neoplasm of upper-outer quadrant of left breast in female, estrogen receptor positive (HCC) Staging form: Breast, AJCC 8th Edition - Clinical: Stage IIA (cT2, cN0, cM0, G3, ER+, PR+, HER2-) - Signed by [name], MD on 07/09/2022 Histologic grading system: 3 grade system - Pathologic: Stage IB (pT2, pN0, cM0, G3, ER+, PR+, HER2-) - Signed by [name], NP on 10/15/2022 Histologic grading system: 3 grade system   SUMMARY OF ONCOLOGIC HISTORY: Oncology History  Malignant neoplasm of upper-outer quadrant of left breast in female, estrogen receptor positive (HCC)  07/09/2022 Initial Diagnosis   Malignant neoplasm of upper-outer quadrant of left breast in female, estrogen receptor positive (HCC)   07/09/2022 Cancer Staging   Staging form: Breast, AJCC 8th Edition - Clinical: Stage IIA (cT2, cN0, cM0, G3, ER+, PR+, HER2-) - Signed by [name], MD on 07/09/2022 Histologic grading system: 3 grade system    Genetic Testing   [facility] Multi-Cancer Panel+RNA was Negative. Of note, a variant of uncertain significance was detected in the HOXB13 gene (c.208C>T) and MUTYH gene (c.1547C>T). Report date is 07/24/2022.  The Multi-Cancer + RNA Panel offered by [facility] includes sequencing and/or deletion/duplication analysis of the following 70 genes:  AIP*, ALK, APC*, ATM*, AXIN2*, BAP1*, BARD1*, BLM*, BMPR1A*, BRCA1*, BRCA2*, BRIP1*, CDC73*, CDH1*, CDK4, CDKN1B*, CDKN2A, CHEK2*, CTNNA1*, DICER1*, EPCAM (del/dup only), EGFR, FH*, FLCN*, GREM1 (promoter dup only), HOXB13, KIT, LZTR1, MAX*, MBD4, MEN1*, MET, MITF, MLH1*, MSH2*, MSH3*, MSH6*, MUTYH*, NF1*, NF2*, NTHL1*, PALB2*, PDGFRA, PMS2*, POLD1*, POLE*, POT1*, PRKAR1A*, PTCH1*, PTEN*, RAD51C*, RAD51D*, RB1*, RET, SDHA* (sequencing only), SDHAF2*, SDHB*, SDHC*, SDHD*, SMAD4*, SMARCA4*, SMARCB1*,  SMARCE1*, STK11*, SUFU*, TMEM127*, TP53*, TSC1*, TSC2*, VHL*. RNA analysis is performed for * genes.   08/01/2022 Surgery   Left breast Lumpectomy: IDC, 3.1cm, grade 3, margins negative, no LVI, ER/PR+, Ki-67 70%, HER2-.  9 SLN negative.   08/01/2022 Oncotype testing   24/10%, indicating approximate 6.5% CT benefit. She was not a candidate for OFSET trial because of Hep C status   10/15/2022 -  Chemotherapy   Patient is on Treatment Plan : BREAST TC q21d     10/15/2022 Cancer Staging   Staging form: Breast, AJCC 8th Edition - Pathologic: Stage IB (pT2, pN0, cM0, G3, ER+, PR+, HER2-) - Signed by [name], NP on 10/15/2022 Histologic grading system: 3 grade system     CURRENT THERAPY: Taxotere/Cytoxan  INTERVAL HISTORY:  Sara Browning 47 y.o. female returns for f/u after receiving her first cycle of Taxotere/Cytoxan.   She started adjuvant TC on 10/15/2022. She is now here before C4 D1 of TC.  Since her last visit here, She complains of a constellation of symptoms.  Most notable is her lower extremity swelling especially around the ankles which gets better when she lays down.  Besides this, she feels tired, no appetite, lack of taste, she gets exhausted easily even with doing her house chores.  She reports no neuropathy.  She has been taking her medication for March hours and prophylactic tenofovir every day.  Rest of the pertinent 10 point ROS reviewed and neg  Patient Active Problem List   Diagnosis Date Noted   Chronic viral hepatitis B without delta agent and without coma (HCC) 09/25/2022   Genetic testing 07/25/2022   Malignant neoplasm of upper-outer quadrant of left breast in female, estrogen receptor positive (HCC)  07/09/2022    has No Known Allergies.  MEDICAL HISTORY: Past Medical History:  Diagnosis Date   Cancer (HCC) 2024   Left Breat Cancer   Hepatitis    Hep B Carrier   Hepatitis 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)    Pneumonia    Hx   Urticaria     SURGICAL  HISTORY: Past Surgical History:  Procedure Laterality Date   BREAST BIOPSY Left 07/30/2022   [place] LT RADIOACTIVE SEED LOC 07/30/2022 [facility]AMMOGR[facility]   BREAST LUMPECTOMY WITH RADIOACTIVE SEED AND SENTINEL LYMPH NODE BIOPSY Left 08/01/2022   Procedure: LEFT BREAST LUMPECTOMY WITH RADIOACTIVE SEED AND SENTINEL LYMPH NODE BIOPSY;  Surgeon: Harriette Bouillon, MD;  Location: [facility] OR;  Service: General;  Laterality: Left;   CESAREAN SECTION     twice    SOCIAL HISTORY: Social History   Socioeconomic History   Marital status: Married    Spouse name: Not on file   Number of children: Not on file   Years of education: Not on file   Highest education level: Not on file  Occupational History   Not on file  Tobacco Use   Smoking status: Never   Smokeless tobacco: Never   Tobacco comments:    Drinks seldom  Vaping Use   Vaping status: Never Used  Substance and Sexual Activity   Alcohol use: Not Currently    Comment: occasionally   Drug use: Never   Sexual activity: Yes  Other Topics Concern   Not on file  Social History Narrative   Not on file   Social Determinants of Health   Financial Resource Strain: Not on file  Food Insecurity: No Food Insecurity (07/19/2022)   Hunger Vital Sign    Worried About Running Out of Food in the Last Year: Never true    Ran Out of Food in the Last Year: Never true  Transportation Needs: No Transportation Needs (07/19/2022)   PRAPARE - Administrator, Civil Service (Medical): No    Lack of Transportation (Non-Medical): No  Physical Activity: Not on file  Stress: Not on file  Social Connections: Not on file  Intimate Partner Violence: Not on file    FAMILY HISTORY: Family History  Problem Relation Age of Onset   Liver cancer Maternal Aunt    Breast cancer Maternal Grandmother    Hypertension Paternal Grandmother    Diabetes Paternal Grandfather     PHYSICAL EXAMINATION    Vitals:   12/18/22 1147  BP: 110/62  Pulse: 99  Resp: 18   Temp: 98.4 F (36.9 C)  SpO2: 99%    Physical Exam Constitutional:      General: She is not in acute distress.    Appearance: Normal appearance. She is not ill-appearing or toxic-appearing.  HENT:     Head: Normocephalic and atraumatic.     Mouth/Throat:     Mouth: Mucous membranes are moist.     Pharynx: Oropharynx is clear. No oropharyngeal exudate or posterior oropharyngeal erythema.  Eyes:     General: No scleral icterus. Cardiovascular:     Rate and Rhythm: Regular rhythm.     Pulses: Normal pulses.     Heart sounds: Normal heart sounds.  Pulmonary:     Effort: Pulmonary effort is normal.     Breath sounds: Normal breath sounds.  Abdominal:     General: Abdomen is flat. Bowel sounds are normal. There is no distension.     Palpations: Abdomen is soft.     Tenderness: There  is no abdominal tenderness.  Musculoskeletal:        General: No swelling.     Cervical back: Neck supple.  Lymphadenopathy:     Cervical: No cervical adenopathy.  Skin:    General: Skin is warm and dry.     Findings: No rash.  Neurological:     General: No focal deficit present.     Mental Status: She is alert.  Psychiatric:        Mood and Affect: Mood normal.        Behavior: Behavior normal.     LABORATORY DATA:  CBC    Component Value Date/Time   WBC 11.0 (H) 12/18/2022 1119   WBC 7.0 07/31/2022 0925   RBC 3.69 (L) 12/18/2022 1119   HGB 10.9 (L) 12/18/2022 1119   HGB 14.9 03/28/2020 1118   HCT 32.8 (L) 12/18/2022 1119   HCT 43.6 03/28/2020 1118   PLT 331 12/18/2022 1119   PLT 257 03/28/2020 1118   MCV 88.9 12/18/2022 1119   MCV 84 03/28/2020 1118   MCH 29.5 12/18/2022 1119   MCHC 33.2 12/18/2022 1119   RDW 15.9 (H) 12/18/2022 1119   RDW 11.9 03/28/2020 1118   LYMPHSABS 1.5 12/18/2022 1119   LYMPHSABS 2.5 03/28/2020 1118   MONOABS 1.3 (H) 12/18/2022 1119   EOSABS 0.0 12/18/2022 1119   EOSABS 0.1 03/28/2020 1118   BASOSABS 0.0 12/18/2022 1119   BASOSABS 0.0 03/28/2020  1118    CMP     Component Value Date/Time   NA 139 12/18/2022 1119   NA 136 03/28/2020 1118   K 3.9 12/18/2022 1119   CL 105 12/18/2022 1119   CO2 26 12/18/2022 1119   GLUCOSE 103 (H) 12/18/2022 1119   BUN 20 12/18/2022 1119   BUN 11 03/28/2020 1118   CREATININE 0.81 12/18/2022 1119   CALCIUM 9.1 12/18/2022 1119   PROT 6.8 12/18/2022 1119   PROT 7.5 03/28/2020 1118   ALBUMIN 4.0 12/18/2022 1119   ALBUMIN 4.3 03/28/2020 1118   AST 14 (L) 12/18/2022 1119   ALT 6 12/18/2022 1119   ALT 12 09/24/2022 1523   ALKPHOS 61 12/18/2022 1119   BILITOT 0.4 12/18/2022 1119   GFRNONAA >60 12/18/2022 1119   GFRAA 102 03/28/2020 1118       ASSESSMENT and THERAPY PLAN:   Malignant neoplasm of upper-outer quadrant of left breast in female, estrogen receptor positive (HCC) Sara Browning is a 47 year old woman with history of stage IB ER/PR positive breast cancer s/p lumpectomy, now receiving adjuvant chemotherapy with taxotere and cytoxan here today for f/u after receiving her first cycle last week.   Treatment Plan:  Lumpectomy Adjuvant chemotherapy Adjuvant radiation Antiestrogen therapy  Current Treatment: Taxotere and Cytoxan cycle 4-day 1  Mucositis: Magic Mouthwash and oral cryotherapy during chemotherapy.  She already has refills for this. Constipation: Managed with PRN foods  Profound fatigue: Energy conservation Hep B carrier, LFT's normal, on prophylaxis.  Sara Browning proceed with treatment today.  Her labs are stable.She is not experiencing any peripheral neuropathy.  RTC in 4 weeks     All questions were answered. The patient knows to call the clinic with any problems, questions or concerns. We can certainly see the patient much sooner if necessary.  Total encounter time:30 minutes*in face-to-face visit time, chart review, lab review, care coordination, order entry, and documentation of the encounter time.   *Total Encounter Time as defined by the Centers for Medicare and  Medicaid Services includes, in addition to  the face-to-face time of a patient visit (documented in the note above) non-face-to-face time: obtaining and reviewing outside history, ordering and reviewing medications, tests or procedures, care coordination (communications with other health care professionals or caregivers) and documentation in the medical record.

## 2022-12-18 NOTE — Telephone Encounter (Signed)
Left message for patient to call back to schedule consult per 8/27 referral.

## 2022-12-18 NOTE — Assessment & Plan Note (Addendum)
Sara Browning is a 47 year old woman with history of stage IB ER/PR positive breast cancer s/p lumpectomy, now receiving adjuvant chemotherapy with taxotere and cytoxan here today for f/u after receiving her first cycle last week.   Treatment Plan:  Lumpectomy Adjuvant chemotherapy Adjuvant radiation Antiestrogen therapy  Current Treatment: Taxotere and Cytoxan cycle 4-day 1  Mucositis: Magic Mouthwash and oral cryotherapy during chemotherapy.  She already has refills for this. Constipation: Managed with PRN foods  Profound fatigue: Energy conservation Hep B carrier, LFT's normal, on prophylaxis.  [name] proceed with treatment today.  Her labs are stable.She is not experiencing any peripheral neuropathy.  RTC in 4 weeks

## 2022-12-19 ENCOUNTER — Encounter: Payer: Self-pay | Admitting: Hematology and Oncology

## 2022-12-19 ENCOUNTER — Other Ambulatory Visit: Payer: Self-pay | Admitting: *Deleted

## 2022-12-19 ENCOUNTER — Other Ambulatory Visit (HOSPITAL_COMMUNITY): Payer: Self-pay

## 2022-12-19 MED ORDER — NYSTATIN 100000 UNIT/ML MT SUSP: OROMUCOSAL | 1 refills | Status: DC

## 2022-12-20 ENCOUNTER — Inpatient Hospital Stay: Payer: BC Managed Care – PPO

## 2022-12-20 ENCOUNTER — Other Ambulatory Visit (HOSPITAL_COMMUNITY): Payer: Self-pay

## 2022-12-20 ENCOUNTER — Ambulatory Visit: Payer: BC Managed Care – PPO

## 2022-12-20 ENCOUNTER — Telehealth: Payer: Self-pay | Admitting: Hematology and Oncology

## 2022-12-20 VITALS — BP 132/66 | HR 76 | Resp 19

## 2022-12-20 DIAGNOSIS — Z5189 Encounter for other specified aftercare: Secondary | ICD-10-CM | POA: Diagnosis not present

## 2022-12-20 DIAGNOSIS — Z3202 Encounter for pregnancy test, result negative: Secondary | ICD-10-CM | POA: Diagnosis not present

## 2022-12-20 DIAGNOSIS — B181 Chronic viral hepatitis B without delta-agent: Secondary | ICD-10-CM | POA: Diagnosis not present

## 2022-12-20 DIAGNOSIS — C50412 Malignant neoplasm of upper-outer quadrant of left female breast: Secondary | ICD-10-CM

## 2022-12-20 DIAGNOSIS — Z803 Family history of malignant neoplasm of breast: Secondary | ICD-10-CM | POA: Diagnosis not present

## 2022-12-20 DIAGNOSIS — Z170 Estrogen receptor positive status [ER+]: Secondary | ICD-10-CM | POA: Diagnosis not present

## 2022-12-20 DIAGNOSIS — K1230 Oral mucositis (ulcerative), unspecified: Secondary | ICD-10-CM | POA: Diagnosis not present

## 2022-12-20 DIAGNOSIS — Z800 Family history of malignant neoplasm of digestive organs: Secondary | ICD-10-CM | POA: Diagnosis not present

## 2022-12-20 DIAGNOSIS — R5383 Other fatigue: Secondary | ICD-10-CM | POA: Diagnosis not present

## 2022-12-20 DIAGNOSIS — K5900 Constipation, unspecified: Secondary | ICD-10-CM | POA: Diagnosis not present

## 2022-12-20 DIAGNOSIS — Z5111 Encounter for antineoplastic chemotherapy: Secondary | ICD-10-CM | POA: Diagnosis not present

## 2022-12-20 MED ORDER — SODIUM CHLORIDE 0.9 % IV SOLN: INTRAVENOUS | Status: DC

## 2022-12-20 MED ORDER — PEGFILGRASTIM-CBQV 6 MG/0.6ML ~~LOC~~ SOSY
6.0000 mg | PREFILLED_SYRINGE | Freq: Once | SUBCUTANEOUS | Status: AC
  Administered 2022-12-20: 6 mg via SUBCUTANEOUS
  Filled 2022-12-20: qty 0.6

## 2022-12-20 NOTE — Patient Instructions (Signed)

## 2022-12-20 NOTE — Telephone Encounter (Signed)
Patient is aware of scheduled visit times/dates

## 2022-12-25 NOTE — Progress Notes (Addendum)
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 LYMPH NODE, LEFT, SENTINEL, EXCISION:      One lymph node with giant cell reaction / prior procedure changes, negative for metastatic carcinoma (0/1).  C. LYMPH NODE, LEFT, SENTINEL, EXCISION:      One lymph node, negative for metastatic carcinoma (0/1).  D. LYMPH NODE, LEFT, SENTINEL, EXCISION:      One lymph node, negative for metastatic carcinoma (0/1).  E. LYMPH NODE, LEFT, SENTINEL, EXCISION:      One lymph node, negative for metastatic carcinoma (0/1).  F. LYMPH NODE, LEFT, SENTINEL, EXCISION:      One lymph node, negative for metastatic carcinoma (0/1).  G. LYMPH NODE, LEFT, SENTINEL, EXCISION:      One lymph node, negative for metastatic carcinoma (0/1).  H. LYMPH NODE, LEFT, SENTINEL, EXCISION:      One lymph node, negative for metastatic carcinoma (0/1).  I. LYMPH NODE, LEFT, SENTINEL, EXCISION:      One lymph node, negative for metastatic carcinoma (0/1).  J. LYMPH NODE, LEFT, SENTINEL, EXCISION:      One lymph node, negative for metastatic carcinoma (0/1).  K. BREAST, LEFT ADDITIONAL MEDIAL MARGIN, EXCISION:      Benign breast tissue, negative for malignancy.      See comment.   ONCOLOGY TABLE:   INVASIVE CARCINOMA  OF THE BREAST:  Resection  Procedure: Lumpectomy Specimen Laterality: Left Histologic Type: Invasive ductal carcinoma Histologic Grade:      Glandular (Acinar)/Tubular Differentiation: 3      Nuclear Pleomorphism: 2      Mitotic Rate: 3      Overall Grade: 3 Tumor Size: 3.1 x 2.4 x 2.0 cm Ductal Carcinoma In Situ: Solid and cribriform types, nuclear grade 3 Lymphatic and/or Vascular Invasion:  Not identified Treatment Effect in the Breast: No known presurgical therapy Margins: All margins negative for invasive carcinoma      Distance from Closest Margin (mm):  2 mm      Specify Closest Margin (required only if <68mm): Medial margin DCIS Margins: Uninvolved by DCIS      Distance from Closest Margin (mm): 9 mm      Specify Closest Margin (required only if <35mm): Anterior margin Regional Lymph Nodes:      Number of Lymph Nodes Examined: 9      Number of Sentinel Nodes Examined: 9      Number of Lymph Nodes with Macrometastases (>2 mm): 0      Number of Lymph Nodes with Micrometastases: 0      Number of Lymph Nodes with Isolated Tumor Cells (=0.2 mm or =200 cells): 0      Size of Largest Metastatic Deposit (mm): NA      Extranodal Extension: Not identified Distant Metastasis:  Distant Site(s) Involved: Not applicable Breast Biomarker Testing Performed on Previous Biopsy:      Testing Performed on Case Number: [id number] ER: 80%, Positive, strong staining intensity PR: 90%, Positive, strong staining intensity Her2: Negative, 1+ (By immunohistochemical stain) Ki-67: 70% Pathologic Stage Classification (pTNM, AJCC 8th Edition): pT2, pN0 Representative Tumor Block: A6 Comment(s): None ([id number])  Receptor Status: ER(80%), PR (90%), Her2-neu (neg), Ki-67(70%)  Did patient present with symptoms (if so, please note symptoms) or was this found on screening mammography?: 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   Past/Anticipated interventions by  Careers adviser, if any: 08/01/2022  Procedure: Left breast seed localized lumpectomy with  excision deep left axillary sentinel lymph node wit  mapping using mag trace   Surgeon: Harriette Bouillon, MD  Past/Anticipated interventions by medical oncology, if any:  12/18/2022  Dr.Iruku Malignant neoplasm of upper-outer quadrant of left breast in female, estrogen receptor positive (HCC)  07/09/2022 Initial Diagnosis    Malignant neoplasm of upper-outer quadrant of left breast in female, estrogen receptor positive (HCC)    07/09/2022 Cancer Staging    Staging form: Breast, AJCC 8th Edition - Clinical: Stage IIA (cT2, cN0, cM0, G3, ER+, PR+, HER2-) - Signed by [name], MD on 07/09/2022 Histologic grading system: 3 grade system      Genetic Testing    [facility] Multi-Cancer Panel+RNA was Negative. Of note, a variant of uncertain significance was detected in the HOXB13 gene (c.208C>T) and MUTYH gene (c.1547C>T). Report date is 07/24/2022.   The Multi-Cancer + RNA Panel offered by [facility] includes sequencing and/or deletion/duplication analysis of the following 70 genes:  AIP*, ALK, APC*, ATM*, AXIN2*, BAP1*, BARD1*, BLM*, BMPR1A*, BRCA1*, BRCA2*, BRIP1*, CDC73*, CDH1*, CDK4, CDKN1B*, CDKN2A, CHEK2*, CTNNA1*, DICER1*, EPCAM (del/dup only), EGFR, FH*, FLCN*, GREM1 (promoter dup only), HOXB13, KIT, LZTR1, MAX*, MBD4, MEN1*, MET, MITF, MLH1*, MSH2*, MSH3*, MSH6*, MUTYH*, NF1*, NF2*, NTHL1*, PALB2*, PDGFRA, PMS2*, POLD1*, POLE*, POT1*, PRKAR1A*, PTCH1*, PTEN*, RAD51C*, RAD51D*, RB1*, RET, SDHA* (sequencing only), SDHAF2*, SDHB*, SDHC*, SDHD*, SMAD4*, SMARCA4*, SMARCB1*, SMARCE1*, STK11*, SUFU*, TMEM127*, TP53*, TSC1*, TSC2*, VHL*. RNA analysis is performed for * genes.    08/01/2022 Surgery    Left breast Lumpectomy: IDC, 3.1cm, grade 3, margins negative, no LVI, ER/PR+, Ki-67 70%, HER2-.  9 SLN negative.    08/01/2022 Oncotype testing    24/10%, indicating approximate 6.5% CT benefit. She was not a candidate for  OFSET trial because of Hep C status    10/15/2022 -  Chemotherapy    Patient is on Treatment Plan : BREAST TC q21d     10/15/2022 Cancer Staging    Staging form: Breast, AJCC 8th Edition - Pathologic: Stage IB (pT2, pN0, cM0, G3, ER+, PR+, HER2-) - Signed by [name], NP on 10/15/2022 Histologic grading system: 3 grade system  ASSESSMENT and THERAPY PLAN:    Malignant neoplasm of upper-outer quadrant of left breast in female, estrogen receptor positive (HCC) Sara Browning is a 47 year old woman with history of stage IB ER/PR positive breast cancer s/p lumpectomy, now receiving adjuvant chemotherapy with taxotere and cytoxan here today for f/u after receiving her first cycle last week.    Treatment Plan:  Lumpectomy Adjuvant chemotherapy Adjuvant radiation Antiestrogen therapy   Current Treatment: Taxotere and Cytoxan cycle 4-day 1   Mucositis: Magic Mouthwash and oral cryotherapy during chemotherapy.  She already has refills for this. Constipation: Managed with PRN foods  Profound fatigue: Energy conservation Hep B carrier, LFT's normal, on prophylaxis.  Ira proceed with treatment today.  Her labs are stable.She is not experiencing any peripheral neuropathy.   RTC in 4 weeks  Lymphedema issues, if any:  none to report in arm/ breast but does have swelling in ankles  Pain issues, if any:  no pain to report  SAFETY ISSUES: Prior radiation? no Pacemaker/ICD? no Possible current pregnancy?no Is the patient on methotrexate? no  Current Complaints / other details:  none at this time.  Vitals:   01/08/23 0837 01/08/23 0905  BP: (!) 89/48 (!) 87/55  Pulse: 93 80  Resp: 20   Temp: (!) 97.4 F (36.3 C)   SpO2: 100%    Pt with no s/s of low bp. Pt states her bp runs low at time since chemo. Ellie PA-C made aware of this and will evaluate pt.

## 2022-12-26 ENCOUNTER — Telehealth: Payer: Self-pay | Admitting: *Deleted

## 2022-12-26 NOTE — Telephone Encounter (Signed)
This RN spoke with pt per her call- asking if she can take iron tablets due to " just feel so weak ".  She states she is drinking fluids but not eating well ( primarily ramen soup).  This RN reviewed her labs showing mild heme decrease- informed her best to not take iron tablet but to use vitamin B's for benefit.  Also discussed need to be mindful of her diet- with goal to eat small portions of food with high calorie and protein - reviewed use of ice cream and soups such as lentil or other bean soups.  Sheryl stated appreciation of recommendations.

## 2022-12-27 ENCOUNTER — Encounter: Payer: Self-pay | Admitting: *Deleted

## 2023-01-07 NOTE — Progress Notes (Signed)
Radiation Oncology         [phone] ________________________________  Name: Sara Browning MRN: [id number]  Date: 01/08/2023  DOB: [birth year]  Follow-Up Visit Note  Outpatient  CC: [name], NP  [name], MD  Diagnosis:  [phone]   ICD-10-CM   1. Malignant neoplasm of upper-outer quadrant of left breast in female, estrogen receptor positive (HCC)  C50.412    Z17.0     2. Ductal carcinoma in situ (DCIS) of left breast  D05.12      Stage IB (pT2, pN0, cM0) Left Breast UOQ, Invasive ductal carcinoma w/ high-grade DCIS, ER+ / PR+ / Her2-, Grade 3: s/p lumpectomy with left axillary SLN evaluation followed by adjuvant chemotherapy   CHIEF COMPLAINT: Here to discuss management of left breast cancer  Narrative:  The patient returns today for follow-up.    Pathology from her left breast lumpectomy and left axillary SLN evaluation on 04/11 were pending at the time of her consultation visit on 08/02/22. Results (reported on 04/16) showed: tumor the size of 3.1 cm; histology of grade 3 invasive ductal carcinoma with high grade DCIS; all margins negative for invasive and in situ carcinoma; margin status to invasive disease of 2 mm from the medial margin; margin status to in situ disease of 9 mm from the anterior margin; nodal status of 9/9 left axillary SLN excisions negative for carcinoma. Prognostic indicators significant for: estrogen receptor 80% positive and progesterone receptor 90% positive, both with strong staining intensity; Proliferation marker Ki67 at 70%; Her2 status negative by IHC; Grade 3.   Since her consultation date of 08/02/22, Oncotype testing was ordered on her surgical specimen (08/01/22 lumpectomy) which showed recurrence score of 24. This score predicts a risk of recurrence outside the breast over the next 9 years of 10%, if the patient's only systemic therapy is an antiestrogen for 5 years.  It also predicted a 6.8% benefit from chemotherapy.  Of note:  The patient was deemed to be an excellent candidate for the OFSET trial by Dr. Al Pimple. However, she did not qualify for this trial.   Systemic therapy, if applicable, involved (dates and therapy as follows): She has completed adjuvant chemotherapy consisting of Taxotere and Cytoxan x 4 cycles from 10/15/22 through 12/18/22 under the care of Dr. Al Pimple. She opted to forego port placement and received systemic treatment through a peripheral IV. Chemo toxicities encountered by the patient throughout the course of systemic treatment included LE swelling (especially around the ankles which would get better when she laid down), profound fatigue, loss of appetite, lack of taste, Mucositis, and constipation,   She has chronic hepatitis B and was seen by infectious disease who started her on Vemlidy 25mg  po daily on 10/02/2022. She continued taking this throughout the course of chemotherapy and will continue to after chemotherapy.  She also had an abdominal US performed in the setting of hep-b on 06/06 which showed no acute findings in the abdomen, and several likely benign gallbladder polyps measuring up to 6 mm  Symptomatically, the patient reports: to be doing well overall. She does have some residual fatigue and weakness since completing her chemotherapy. She denies any breast pain or issues with range of motion.         ALLERGIES:  has No Known Allergies.  Meds: Current Outpatient Medications  Medication Sig Dispense Refill   loratadine (CLARITIN) 10 MG tablet Take 10 mg by mouth daily as needed for allergies.     magic mouthwash (nystatin, lidocaine,  diphenhydrAMINE, alum & mag hydroxide) suspension Swish and swallow 5-10 mls by mouth 4 times a day as needed 240 mL 1   Melatonin 5 MG CHEW Chew 5 mg by mouth at bedtime as needed (sleep).     Multiple Vitamin (MULTIVITAMIN) tablet Take 1 tablet by mouth daily.     ondansetron (ZOFRAN) 8 MG tablet Take 1 tablet (8 mg total) by mouth every 8 (eight) hours as  needed for nausea or vomiting. Start on the third day after chemotherapy. 30 tablet 1   OVER THE COUNTER MEDICATION Take 1 Scoop by mouth daily. prolean greens supplement powder     oxyCODONE (OXY IR/ROXICODONE) 5 MG immediate release tablet Take 1 tablet (5 mg total) by mouth every 6 (six) hours as needed for severe pain. 15 tablet 0   prochlorperazine (COMPAZINE) 10 MG tablet Take 1 tablet (10 mg total) by mouth every 6 (six) hours as needed for nausea or vomiting. 30 tablet 1   Tenofovir Alafenamide Fumarate (VEMLIDY) 25 MG TABS Take 1 tablet (25 mg total) by mouth daily. 30 tablet 5   dexamethasone (DECADRON) 4 MG tablet Take 2 tabs by mouth 2 times daily starting day before chemo. Then take 2 tabs daily for 2 days starting day after chemo, then take 1 tab daily x 3 days. Take with food. (Patient not taking: Reported on 01/08/2023) 30 tablet 1   OVER THE COUNTER MEDICATION Take 2 capsules by mouth daily. Graviola supplement (Patient not taking: Reported on 01/08/2023)     No current facility-administered medications for this encounter.    Physical Findings:  height is 5\' 3"  (1.6 m) and weight is 169 lb 6.4 oz (76.8 kg). Her temperature is 97.4 F (36.3 C) (abnormal). Her blood pressure is 87/55 (abnormal) and her pulse is 80. Her respiration is 20 and oxygen saturation is 100%. .     General: Alert and oriented, in no acute distress HEENT: Head is normocephalic. Extraocular movements are intact. Oropharynx is clear. Neck: Neck is supple, no palpable cervical or supraclavicular lymphadenopathy. Heart: Regular in rate and rhythm with no murmurs, rubs, or gallops. Chest: Clear to auscultation bilaterally, with no rhonchi, wheezes, or rales. Abdomen: Soft, nontender, nondistended, with no rigidity or guarding. Extremities: No cyanosis or edema. Lymphatics: see Neck Exam Musculoskeletal: symmetric strength and muscle tone throughout. Neurologic: No obvious focalities. Speech is fluent.   Psychiatric: Judgment and insight are intact. Affect is appropriate. Breast exam reveals: No signs of delayed healing or infection at lumpectomy and lymph node evaluation incisions. Scar tissue palpated beneath the lumpectomy scar.   Lab Findings: Lab Results  Component Value Date   WBC 11.0 (H) 12/18/2022   HGB 10.9 (L) 12/18/2022   HCT 32.8 (L) 12/18/2022   MCV 88.9 12/18/2022   PLT 331 12/18/2022    @LASTCHEMISTRY @  Radiographic Findings: No results found.  Impression/Plan: Patient continues to heal from her chemotherapy. We discussed adjuvant radiotherapy today.  I recommend radiation in order to decrease her risk of locoregional disease.  I reviewed the logistics, benefits, risks, and potential side effects of this treatment in detail. Risks may include but not necessary be limited to acute and late injury tissue in the radiation fields such as skin irritation (change in color/pigmentation, itching, dryness, pain, peeling). She may experience fatigue. We also discussed possible risk of long term cosmetic changes or scar tissue. There is also a smaller risk for lung toxicity, cardiac toxicity, brachial plexopathy, lymphedema, musculoskeletal changes, rib fragility or induction of  a second malignancy, late chronic non-healing soft tissue wound.    The patient asked good questions which I answered to her satisfaction. She is enthusiastic about proceeding with treatment. A consent form has been signed and placed in her chart.  The patient will receive 40.05 Gy in 15 fractions to the left breast.  This will be followed by a boost of 10 Gy in 5 fractions.  Last pregnancy test on 12/18/2022 was negative. Patient has not had a menstrual cycle since this date and denies any possibility of pregnancy.   On date of service, in total, I spent 30 minutes on this encounter. Patient was seen in person.  _____________________________________    Joyice Faster, PA-C   This document serves as a  record of services personally performed by Lonie Peak, MD. It was created on her behalf by Neena Rhymes, a trained medical scribe. The creation of this record is based on the scribe's personal observations and the provider's statements to them. This document has been checked and approved by the attending provider.

## 2023-01-08 ENCOUNTER — Ambulatory Visit
Admission: RE | Admit: 2023-01-08 | Discharge: 2023-01-08 | Disposition: A | Discharge status: Home / Self Care | Payer: BC Managed Care – PPO | Source: Ambulatory Visit | Attending: Radiation Oncology | Admitting: Radiation Oncology

## 2023-01-08 ENCOUNTER — Encounter: Payer: Self-pay | Admitting: Radiation Oncology

## 2023-01-08 VITALS — BP 87/55 | HR 80 | Temp 97.4°F | Resp 20 | Ht 63.0 in | Wt 169.4 lb

## 2023-01-08 DIAGNOSIS — C50412 Malignant neoplasm of upper-outer quadrant of left female breast: Secondary | ICD-10-CM

## 2023-01-08 DIAGNOSIS — D0512 Intraductal carcinoma in situ of left breast: Secondary | ICD-10-CM

## 2023-01-08 DIAGNOSIS — Z170 Estrogen receptor positive status [ER+]: Secondary | ICD-10-CM | POA: Diagnosis not present

## 2023-01-14 ENCOUNTER — Encounter: Payer: Self-pay | Admitting: *Deleted

## 2023-01-14 DIAGNOSIS — Z170 Estrogen receptor positive status [ER+]: Secondary | ICD-10-CM

## 2023-01-16 ENCOUNTER — Other Ambulatory Visit: Payer: Self-pay

## 2023-01-17 ENCOUNTER — Telehealth: Payer: Self-pay

## 2023-01-17 NOTE — Telephone Encounter (Signed)
Notified patient of completion of Leave forms. Fax transmission confirmation received. Copy of forms placed for pick-up as requested by Patient. No other needs or concerns voiced at this time.

## 2023-01-20 ENCOUNTER — Inpatient Hospital Stay: Payer: BC Managed Care – PPO

## 2023-01-20 ENCOUNTER — Inpatient Hospital Stay: Payer: BC Managed Care – PPO | Attending: Hematology and Oncology

## 2023-01-20 ENCOUNTER — Other Ambulatory Visit: Payer: Self-pay

## 2023-01-20 ENCOUNTER — Inpatient Hospital Stay (HOSPITAL_BASED_OUTPATIENT_CLINIC_OR_DEPARTMENT_OTHER): Payer: BC Managed Care – PPO | Admitting: Hematology and Oncology

## 2023-01-20 VITALS — BP 100/52 | HR 74 | Temp 98.0°F | Resp 18 | Wt 167.7 lb

## 2023-01-20 DIAGNOSIS — Z170 Estrogen receptor positive status [ER+]: Secondary | ICD-10-CM | POA: Insufficient documentation

## 2023-01-20 DIAGNOSIS — Z803 Family history of malignant neoplasm of breast: Secondary | ICD-10-CM | POA: Insufficient documentation

## 2023-01-20 DIAGNOSIS — Z9221 Personal history of antineoplastic chemotherapy: Secondary | ICD-10-CM | POA: Diagnosis not present

## 2023-01-20 DIAGNOSIS — Z3202 Encounter for pregnancy test, result negative: Secondary | ICD-10-CM | POA: Insufficient documentation

## 2023-01-20 DIAGNOSIS — C50412 Malignant neoplasm of upper-outer quadrant of left female breast: Secondary | ICD-10-CM | POA: Insufficient documentation

## 2023-01-20 DIAGNOSIS — Z800 Family history of malignant neoplasm of digestive organs: Secondary | ICD-10-CM | POA: Diagnosis not present

## 2023-01-20 LAB — CBC WITH DIFFERENTIAL/PLATELET
Abs Immature Granulocytes: 0.01 10*3/uL (ref 0.00–0.07)
Basophils Absolute: 0 10*3/uL (ref 0.0–0.1)
Basophils Relative: 1 %
Eosinophils Absolute: 0.1 10*3/uL (ref 0.0–0.5)
Eosinophils Relative: 3 %
HCT: 35.1 % — ABNORMAL LOW (ref 36.0–46.0)
Hemoglobin: 11.4 g/dL — ABNORMAL LOW (ref 12.0–15.0)
Immature Granulocytes: 0 %
Lymphocytes Relative: 39 %
Lymphs Abs: 1.6 10*3/uL (ref 0.7–4.0)
MCH: 29.8 pg (ref 26.0–34.0)
MCHC: 32.5 g/dL (ref 30.0–36.0)
MCV: 91.9 fL (ref 80.0–100.0)
Monocytes Absolute: 0.4 10*3/uL (ref 0.1–1.0)
Monocytes Relative: 9 %
Neutro Abs: 2 10*3/uL (ref 1.7–7.7)
Neutrophils Relative %: 48 %
Platelets: 232 10*3/uL (ref 150–400)
RBC: 3.82 MIL/uL — ABNORMAL LOW (ref 3.87–5.11)
RDW: 15.2 % (ref 11.5–15.5)
WBC: 4.1 10*3/uL (ref 4.0–10.5)
nRBC: 0 % (ref 0.0–0.2)

## 2023-01-20 LAB — CMP (CANCER CENTER ONLY)
ALT: 6 U/L (ref 0–44)
AST: 16 U/L (ref 15–41)
Albumin: 3.8 g/dL (ref 3.5–5.0)
Alkaline Phosphatase: 61 U/L (ref 38–126)
Anion gap: 5 (ref 5–15)
BUN: 20 mg/dL (ref 6–20)
CO2: 27 mmol/L (ref 22–32)
Calcium: 9.2 mg/dL (ref 8.9–10.3)
Chloride: 106 mmol/L (ref 98–111)
Creatinine: 0.72 mg/dL (ref 0.44–1.00)
GFR, Estimated: >60 mL/min (ref >60)
Glucose, Bld: 105 mg/dL — ABNORMAL HIGH (ref 70–99)
Potassium: 4.4 mmol/L (ref 3.5–5.1)
Sodium: 138 mmol/L (ref 135–145)
Total Bilirubin: 0.4 mg/dL (ref 0.3–1.2)
Total Protein: 6.5 g/dL (ref 6.5–8.1)

## 2023-01-20 LAB — PREGNANCY, URINE: Preg Test, Ur: NEGATIVE

## 2023-01-20 NOTE — Progress Notes (Signed)
[facility] Cancer Follow up:    Sara Morales, NP [address] [place] [address]   DIAGNOSIS:  Cancer Staging  Malignant neoplasm of upper-outer quadrant of left breast in female, estrogen receptor positive (HCC) Staging form: Breast, AJCC 8th Edition - Clinical: Stage IIA (cT2, cN0, cM0, G3, ER+, PR+, HER2-) - Signed by [name], MD on 07/09/2022 Histologic grading system: 3 grade system - Pathologic: Stage IB (pT2, pN0, cM0, G3, ER+, PR+, HER2-) - Signed by [name], NP on 10/15/2022 Histologic grading system: 3 grade system   SUMMARY OF ONCOLOGIC HISTORY: Oncology History  Malignant neoplasm of upper-outer quadrant of left breast in female, estrogen receptor positive (HCC)  07/09/2022 Initial Diagnosis   Malignant neoplasm of upper-outer quadrant of left breast in female, estrogen receptor positive (HCC)   07/09/2022 Cancer Staging   Staging form: Breast, AJCC 8th Edition - Clinical: Stage IIA (cT2, cN0, cM0, G3, ER+, PR+, HER2-) - Signed by [name], MD on 07/09/2022 Histologic grading system: 3 grade system    Genetic Testing   [facility] Multi-Cancer Panel+RNA was Negative. Of note, a variant of uncertain significance was detected in the HOXB13 gene (c.208C>T) and MUTYH gene (c.1547C>T). Report date is 07/24/2022.  The Multi-Cancer + RNA Panel offered by [facility] includes sequencing and/or deletion/duplication analysis of the following 70 genes:  AIP*, ALK, APC*, ATM*, AXIN2*, BAP1*, BARD1*, BLM*, BMPR1A*, BRCA1*, BRCA2*, BRIP1*, CDC73*, CDH1*, CDK4, CDKN1B*, CDKN2A, CHEK2*, CTNNA1*, DICER1*, EPCAM (del/dup only), EGFR, FH*, FLCN*, GREM1 (promoter dup only), HOXB13, KIT, LZTR1, MAX*, MBD4, MEN1*, MET, MITF, MLH1*, MSH2*, MSH3*, MSH6*, MUTYH*, NF1*, NF2*, NTHL1*, PALB2*, PDGFRA, PMS2*, POLD1*, POLE*, POT1*, PRKAR1A*, PTCH1*, PTEN*, RAD51C*, RAD51D*, RB1*, RET, SDHA* (sequencing only), SDHAF2*, SDHB*, SDHC*, SDHD*, SMAD4*, SMARCA4*, SMARCB1*,  SMARCE1*, STK11*, SUFU*, TMEM127*, TP53*, TSC1*, TSC2*, VHL*. RNA analysis is performed for * genes.   08/01/2022 Surgery   Left breast Lumpectomy: IDC, 3.1cm, grade 3, margins negative, no LVI, ER/PR+, Ki-67 70%, HER2-.  9 SLN negative.   08/01/2022 Oncotype testing   24/10%, indicating approximate 6.5% CT benefit. She was not a candidate for OFSET trial because of Hep C status   10/15/2022 -  Chemotherapy   Patient is on Treatment Plan : BREAST TC q21d     10/15/2022 Cancer Staging   Staging form: Breast, AJCC 8th Edition - Pathologic: Stage IB (pT2, pN0, cM0, G3, ER+, PR+, HER2-) - Signed by [name], NP on 10/15/2022 Histologic grading system: 3 grade system     CURRENT THERAPY: observation. INTERVAL HISTORY:  Sara Browning 47 y.o. female returns for f/u after completing 4 cycles of Taxotere/Cytoxan.    The patient, with a history of breast cancer, presents for a post-chemotherapy follow-up. She reports that she is recovering well, with decreasing leg swelling and improving fatigue. She has been able to increase her activity level. However, she notes difficulty with temperature regulation, experiencing periods of feeling too hot or too cold. She also reports amenorrhea since the completion of chemotherapy.  The patient is scheduled to start radiation therapy soon and has been advised to continue antiviral medication until the completion of radiation therapy. She also reports a need to adjust her eating habits after having been encouraged to eat as much as possible during chemotherapy.  Rest of the pertinent 10 point ROS reviewed and neg  Patient Active Problem List   Diagnosis Date Noted   Chronic viral hepatitis B without delta agent and without coma (HCC) 09/25/2022   Genetic  testing 07/25/2022   Malignant neoplasm of upper-outer quadrant of left breast in female, estrogen receptor positive (HCC) 07/09/2022    has No Known Allergies.  MEDICAL HISTORY: Past  Medical History:  Diagnosis Date   Cancer (HCC) 2024   Left Breat Cancer   Hepatitis    Hep B Carrier   Hepatitis 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)    Pneumonia    Hx   Urticaria     SURGICAL HISTORY: Past Surgical History:  Procedure Laterality Date   BREAST BIOPSY Left 07/30/2022   [place] LT RADIOACTIVE SEED LOC 07/30/2022 [facility] MAMMOGRAP[facility]   BREAST LUMPECTOMY WITH RADIOACTIVE SEED AND SENTINEL LYMPH NODE BIOPSY Left 08/01/2022   Procedure: LEFT BREAST LUMPECTOMY WITH RADIOACTIVE SEED AND SENTINEL LYMPH NODE BIOPSY;  Surgeon: Harriette Bouillon, MD;  Location: [facility] OR;  Service: General;  Laterality: Left;   CESAREAN SECTION     twice    SOCIAL HISTORY: Social History   Socioeconomic History   Marital status: Married    Spouse name: Not on file   Number of children: Not on file   Years of education: Not on file   Highest education level: Not on file  Occupational History   Not on file  Tobacco Use   Smoking status: Never   Smokeless tobacco: Never   Tobacco comments:    Drinks seldom  Vaping Use   Vaping status: Never Used  Substance and Sexual Activity   Alcohol use: Not Currently    Comment: occasionally   Drug use: Never   Sexual activity: Yes  Other Topics Concern   Not on file  Social History Narrative   Not on file   Social Determinants of Health   Financial Resource Strain: Not on file  Food Insecurity: No Food Insecurity (07/19/2022)   Hunger Vital Sign    Worried About Running Out of Food in the Last Year: Never true    Ran Out of Food in the Last Year: Never true  Transportation Needs: No Transportation Needs (07/19/2022)   PRAPARE - Administrator, Civil Service (Medical): No    Lack of Transportation (Non-Medical): No  Physical Activity: Not on file  Stress: Not on file  Social Connections: Not on file  Intimate Partner Violence: Not At Risk (01/08/2023)   Humiliation, Afraid, Rape, and Kick questionnaire    Fear of Current or Ex-Partner: No    Emotionally  Abused: No    Physically Abused: No    Sexually Abused: No    FAMILY HISTORY: Family History  Problem Relation Age of Onset   Liver cancer Maternal Aunt    Breast cancer Maternal Grandmother    Hypertension Paternal Grandmother    Diabetes Paternal Grandfather     PHYSICAL EXAMINATION    Vitals:   01/20/23 0926  BP: (!) 100/52  Pulse: 74  Resp: 18  Temp: 98 F (36.7 C)  SpO2: 100%    Physical Exam Constitutional:      General: She is not in acute distress.    Appearance: Normal appearance. She is not ill-appearing or toxic-appearing.  HENT:     Head: Normocephalic and atraumatic.     Mouth/Throat:     Mouth: Mucous membranes are moist.     Pharynx: Oropharynx is clear. No oropharyngeal exudate or posterior oropharyngeal erythema.  Eyes:     General: No scleral icterus. Cardiovascular:     Rate and Rhythm: Regular rhythm.     Pulses: Normal pulses.     Heart sounds:  Normal heart sounds.  Pulmonary:     Effort: Pulmonary effort is normal.     Breath sounds: Normal breath sounds.  Abdominal:     General: Abdomen is flat. Bowel sounds are normal. There is no distension.     Palpations: Abdomen is soft.     Tenderness: There is no abdominal tenderness.  Musculoskeletal:        General: No swelling.     Cervical back: Neck supple.  Lymphadenopathy:     Cervical: No cervical adenopathy.  Skin:    General: Skin is warm and dry.     Findings: No rash.  Neurological:     General: No focal deficit present.     Mental Status: She is alert.  Psychiatric:        Mood and Affect: Mood normal.        Behavior: Behavior normal.     LABORATORY DATA:  CBC    Component Value Date/Time   WBC 11.0 (H) 12/18/2022 1119   WBC 7.0 07/31/2022 0925   RBC 3.69 (L) 12/18/2022 1119   HGB 10.9 (L) 12/18/2022 1119   HGB 14.9 03/28/2020 1118   HCT 32.8 (L) 12/18/2022 1119   HCT 43.6 03/28/2020 1118   PLT 331 12/18/2022 1119   PLT 257 03/28/2020 1118   MCV 88.9  12/18/2022 1119   MCV 84 03/28/2020 1118   MCH 29.5 12/18/2022 1119   MCHC 33.2 12/18/2022 1119   RDW 15.9 (H) 12/18/2022 1119   RDW 11.9 03/28/2020 1118   LYMPHSABS 1.5 12/18/2022 1119   LYMPHSABS 2.5 03/28/2020 1118   MONOABS 1.3 (H) 12/18/2022 1119   EOSABS 0.0 12/18/2022 1119   EOSABS 0.1 03/28/2020 1118   BASOSABS 0.0 12/18/2022 1119   BASOSABS 0.0 03/28/2020 1118    CMP     Component Value Date/Time   NA 139 12/18/2022 1119   NA 136 03/28/2020 1118   K 3.9 12/18/2022 1119   CL 105 12/18/2022 1119   CO2 26 12/18/2022 1119   GLUCOSE 103 (H) 12/18/2022 1119   BUN 20 12/18/2022 1119   BUN 11 03/28/2020 1118   CREATININE 0.81 12/18/2022 1119   CALCIUM 9.1 12/18/2022 1119   PROT 6.8 12/18/2022 1119   PROT 7.5 03/28/2020 1118   ALBUMIN 4.0 12/18/2022 1119   ALBUMIN 4.3 03/28/2020 1118   AST 14 (L) 12/18/2022 1119   ALT 6 12/18/2022 1119   ALT 12 09/24/2022 1523   ALKPHOS 61 12/18/2022 1119   BILITOT 0.4 12/18/2022 1119   GFRNONAA >60 12/18/2022 1119   GFRAA 102 03/28/2020 1118       ASSESSMENT and THERAPY PLAN:   Malignant neoplasm of upper-outer quadrant of left breast in female, estrogen receptor positive (HCC) Akeisha is a 47 year old woman with history of stage IB ER/PR positive breast cancer s/p lumpectomy, now receiving adjuvant chemotherapy with taxotere and cytoxan here today for f/u after receiving her first cycle last week.   Treatment Plan:  Lumpectomy Adjuvant chemotherapy Adjuvant radiation Antiestrogen therapy  Current Treatment: Completed 4 cycles of adjuvant TC  Breast Cancer Post-Chemotherapy Completed chemotherapy on August 27th.  Reports improving fatigue and leg swelling. No neuropathy. Reports difficulty with temperature regulation and amenorrhea, both expected to be temporary. -Start radiation therapy on October 3rd. -Plan to start anti-estrogen therapy post-radiation in November. Discussed options of ovarian suppression with aromatase  inhibitors or tamoxifen. Plan to start with ovarian suppression and tamoxifen, with the option to switch to tamoxifen alone if side  effects are intolerable.  General Health Maintenance -Check liver function and complete blood count today. -Follow-up appointment in November, post-radiation therapy.  All questions were answered. The patient knows to call the clinic with any problems, questions or concerns. We can certainly see the patient much sooner if necessary.  Total encounter time:30 minutes*in face-to-face visit time, chart review, lab review, care coordination, order entry, and documentation of the encounter time.   *Total Encounter Time as defined by the Centers for Medicare and Medicaid Services includes, in addition to the face-to-face time of a patient visit (documented in the note above) non-face-to-face time: obtaining and reviewing outside history, ordering and reviewing medications, tests or procedures, care coordination (communications with other health care professionals or caregivers) and documentation in the medical record.

## 2023-01-20 NOTE — Progress Notes (Signed)
Specialty Pharmacy Refill Coordination Note  Sara Browning is a 47 y.o. female contacted today regarding refills of specialty medication(s) No data recorded.  Patient requested Delivery  on 01/24/23  to verified address [address] [place] [address]   Medication will be filled on 01/23/23.

## 2023-01-20 NOTE — Assessment & Plan Note (Signed)
Sara Browning is a 47 year old woman with history of stage IB ER/PR positive breast cancer s/p lumpectomy, now receiving adjuvant chemotherapy with taxotere and cytoxan here today for f/u after receiving her first cycle last week.   Treatment Plan:  Lumpectomy Adjuvant chemotherapy Adjuvant radiation Antiestrogen therapy  Current Treatment: Completed 4 cycles of adjuvant TC  Breast Cancer Post-Chemotherapy Completed chemotherapy on August 27th.  Reports improving fatigue and leg swelling. No neuropathy. Reports difficulty with temperature regulation and amenorrhea, both expected to be temporary. -Start radiation therapy on October 3rd. -Plan to start anti-estrogen therapy post-radiation in November. Discussed options of ovarian suppression with aromatase inhibitors or tamoxifen. Plan to start with ovarian suppression and tamoxifen, with the option to switch to tamoxifen alone if side effects are intolerable.  General Health Maintenance -Check liver function and complete blood count today. -Follow-up appointment in November, post-radiation therapy.

## 2023-01-21 ENCOUNTER — Ambulatory Visit
Admission: RE | Admit: 2023-01-21 | Discharge: 2023-01-21 | Disposition: A | Discharge status: Home / Self Care | Payer: BC Managed Care – PPO | Source: Ambulatory Visit | Attending: Radiation Oncology | Admitting: Radiation Oncology

## 2023-01-21 DIAGNOSIS — Z510 Encounter for antineoplastic radiation therapy: Secondary | ICD-10-CM | POA: Diagnosis not present

## 2023-01-21 DIAGNOSIS — C50412 Malignant neoplasm of upper-outer quadrant of left female breast: Secondary | ICD-10-CM | POA: Insufficient documentation

## 2023-01-21 DIAGNOSIS — Z170 Estrogen receptor positive status [ER+]: Secondary | ICD-10-CM | POA: Diagnosis not present

## 2023-01-22 ENCOUNTER — Other Ambulatory Visit: Payer: Self-pay

## 2023-01-22 DIAGNOSIS — C50412 Malignant neoplasm of upper-outer quadrant of left female breast: Secondary | ICD-10-CM | POA: Diagnosis not present

## 2023-01-22 DIAGNOSIS — Z170 Estrogen receptor positive status [ER+]: Secondary | ICD-10-CM | POA: Diagnosis not present

## 2023-01-22 DIAGNOSIS — Z510 Encounter for antineoplastic radiation therapy: Secondary | ICD-10-CM | POA: Diagnosis not present

## 2023-01-22 LAB — RAD ONC ARIA SESSION SUMMARY
Course Elapsed Days: 0
Plan Fractions Treated to Date: 1
Plan Prescribed Dose Per Fraction: 2.67 Gy
Plan Total Fractions Prescribed: 15
Plan Total Prescribed Dose: 40.05 Gy
Reference Point Dosage Given to Date: 2.67 Gy
Reference Point Session Dosage Given: 2.67 Gy
Session Number: 1

## 2023-01-23 ENCOUNTER — Ambulatory Visit
Admission: RE | Admit: 2023-01-23 | Discharge: 2023-01-23 | Disposition: A | Discharge status: Home / Self Care | Payer: BC Managed Care – PPO | Source: Ambulatory Visit | Attending: Radiation Oncology | Admitting: Radiation Oncology

## 2023-01-23 ENCOUNTER — Other Ambulatory Visit: Payer: Self-pay

## 2023-01-23 DIAGNOSIS — Z170 Estrogen receptor positive status [ER+]: Secondary | ICD-10-CM | POA: Diagnosis not present

## 2023-01-23 DIAGNOSIS — Z510 Encounter for antineoplastic radiation therapy: Secondary | ICD-10-CM | POA: Diagnosis not present

## 2023-01-23 DIAGNOSIS — C50412 Malignant neoplasm of upper-outer quadrant of left female breast: Secondary | ICD-10-CM | POA: Diagnosis not present

## 2023-01-23 LAB — RAD ONC ARIA SESSION SUMMARY
Course Elapsed Days: 1
Plan Fractions Treated to Date: 2
Plan Prescribed Dose Per Fraction: 2.67 Gy
Plan Total Fractions Prescribed: 15
Plan Total Prescribed Dose: 40.05 Gy
Reference Point Dosage Given to Date: 5.34 Gy
Reference Point Session Dosage Given: 2.67 Gy
Session Number: 2

## 2023-01-24 ENCOUNTER — Ambulatory Visit
Admission: RE | Admit: 2023-01-24 | Discharge: 2023-01-24 | Disposition: A | Discharge status: Home / Self Care | Payer: BC Managed Care – PPO | Source: Ambulatory Visit | Attending: Radiation Oncology | Admitting: Radiation Oncology

## 2023-01-24 ENCOUNTER — Other Ambulatory Visit: Payer: Self-pay

## 2023-01-24 DIAGNOSIS — Z170 Estrogen receptor positive status [ER+]: Secondary | ICD-10-CM | POA: Diagnosis not present

## 2023-01-24 DIAGNOSIS — C50412 Malignant neoplasm of upper-outer quadrant of left female breast: Secondary | ICD-10-CM | POA: Diagnosis not present

## 2023-01-24 DIAGNOSIS — Z510 Encounter for antineoplastic radiation therapy: Secondary | ICD-10-CM | POA: Diagnosis not present

## 2023-01-24 LAB — RAD ONC ARIA SESSION SUMMARY
Course Elapsed Days: 2
Plan Fractions Treated to Date: 3
Plan Prescribed Dose Per Fraction: 2.67 Gy
Plan Total Fractions Prescribed: 15
Plan Total Prescribed Dose: 40.05 Gy
Reference Point Dosage Given to Date: 8.01 Gy
Reference Point Session Dosage Given: 2.67 Gy
Session Number: 3

## 2023-01-24 MED ORDER — RADIAPLEXRX EX GEL
Freq: Once | CUTANEOUS | Status: AC
  Administered 2023-01-24: 1 via TOPICAL

## 2023-01-24 MED ORDER — ALRA NON-METALLIC DEODORANT (RAD-ONC)
1.0000 | Freq: Once | TOPICAL | Status: AC
  Administered 2023-01-24: 1 via TOPICAL

## 2023-01-27 ENCOUNTER — Other Ambulatory Visit: Payer: Self-pay

## 2023-01-27 ENCOUNTER — Ambulatory Visit
Admission: RE | Admit: 2023-01-27 | Discharge: 2023-01-27 | Disposition: A | Discharge status: Home / Self Care | Payer: BC Managed Care – PPO | Source: Ambulatory Visit | Attending: Radiation Oncology | Admitting: Radiation Oncology

## 2023-01-27 DIAGNOSIS — C50412 Malignant neoplasm of upper-outer quadrant of left female breast: Secondary | ICD-10-CM | POA: Diagnosis not present

## 2023-01-27 DIAGNOSIS — Z170 Estrogen receptor positive status [ER+]: Secondary | ICD-10-CM | POA: Diagnosis not present

## 2023-01-27 LAB — RAD ONC ARIA SESSION SUMMARY
Course Elapsed Days: 5
Plan Fractions Treated to Date: 4
Plan Prescribed Dose Per Fraction: 2.67 Gy
Plan Total Fractions Prescribed: 15
Plan Total Prescribed Dose: 40.05 Gy
Reference Point Dosage Given to Date: 10.68 Gy
Reference Point Session Dosage Given: 2.67 Gy
Session Number: 4

## 2023-01-28 ENCOUNTER — Ambulatory Visit
Admission: RE | Admit: 2023-01-28 | Discharge: 2023-01-28 | Disposition: A | Discharge status: Home / Self Care | Payer: BC Managed Care – PPO | Source: Ambulatory Visit | Attending: Radiation Oncology | Admitting: Radiation Oncology

## 2023-01-28 ENCOUNTER — Other Ambulatory Visit: Payer: Self-pay

## 2023-01-28 DIAGNOSIS — Z170 Estrogen receptor positive status [ER+]: Secondary | ICD-10-CM | POA: Diagnosis not present

## 2023-01-28 DIAGNOSIS — C50412 Malignant neoplasm of upper-outer quadrant of left female breast: Secondary | ICD-10-CM | POA: Diagnosis not present

## 2023-01-28 DIAGNOSIS — Z510 Encounter for antineoplastic radiation therapy: Secondary | ICD-10-CM | POA: Diagnosis not present

## 2023-01-28 LAB — RAD ONC ARIA SESSION SUMMARY
Course Elapsed Days: 6
Plan Fractions Treated to Date: 5
Plan Prescribed Dose Per Fraction: 2.67 Gy
Plan Total Fractions Prescribed: 15
Plan Total Prescribed Dose: 40.05 Gy
Reference Point Dosage Given to Date: 13.35 Gy
Reference Point Session Dosage Given: 2.67 Gy
Session Number: 5

## 2023-01-29 ENCOUNTER — Ambulatory Visit
Admission: RE | Admit: 2023-01-29 | Discharge: 2023-01-29 | Disposition: A | Discharge status: Home / Self Care | Payer: BC Managed Care – PPO | Source: Ambulatory Visit | Attending: Radiation Oncology | Admitting: Radiation Oncology

## 2023-01-29 ENCOUNTER — Other Ambulatory Visit: Payer: Self-pay

## 2023-01-29 DIAGNOSIS — C50412 Malignant neoplasm of upper-outer quadrant of left female breast: Secondary | ICD-10-CM | POA: Diagnosis not present

## 2023-01-29 DIAGNOSIS — Z170 Estrogen receptor positive status [ER+]: Secondary | ICD-10-CM | POA: Diagnosis not present

## 2023-01-29 LAB — RAD ONC ARIA SESSION SUMMARY
Course Elapsed Days: 7
Plan Fractions Treated to Date: 6
Plan Prescribed Dose Per Fraction: 2.67 Gy
Plan Total Fractions Prescribed: 15
Plan Total Prescribed Dose: 40.05 Gy
Reference Point Dosage Given to Date: 16.02 Gy
Reference Point Session Dosage Given: 2.67 Gy
Session Number: 6

## 2023-01-30 ENCOUNTER — Other Ambulatory Visit: Payer: Self-pay

## 2023-01-30 ENCOUNTER — Ambulatory Visit
Admission: RE | Admit: 2023-01-30 | Discharge: 2023-01-30 | Disposition: A | Discharge status: Home / Self Care | Payer: BC Managed Care – PPO | Source: Ambulatory Visit | Attending: Radiation Oncology | Admitting: Radiation Oncology

## 2023-01-30 DIAGNOSIS — Z170 Estrogen receptor positive status [ER+]: Secondary | ICD-10-CM | POA: Diagnosis not present

## 2023-01-30 DIAGNOSIS — C50412 Malignant neoplasm of upper-outer quadrant of left female breast: Secondary | ICD-10-CM | POA: Diagnosis not present

## 2023-01-30 DIAGNOSIS — Z510 Encounter for antineoplastic radiation therapy: Secondary | ICD-10-CM | POA: Diagnosis not present

## 2023-01-30 LAB — RAD ONC ARIA SESSION SUMMARY
Course Elapsed Days: 8
Plan Fractions Treated to Date: 7
Plan Prescribed Dose Per Fraction: 2.67 Gy
Plan Total Fractions Prescribed: 15
Plan Total Prescribed Dose: 40.05 Gy
Reference Point Dosage Given to Date: 18.69 Gy
Reference Point Session Dosage Given: 2.67 Gy
Session Number: 7

## 2023-01-31 ENCOUNTER — Other Ambulatory Visit: Payer: Self-pay

## 2023-01-31 ENCOUNTER — Ambulatory Visit
Admission: RE | Admit: 2023-01-31 | Discharge: 2023-01-31 | Disposition: A | Discharge status: Home / Self Care | Payer: BC Managed Care – PPO | Source: Ambulatory Visit | Attending: Radiation Oncology | Admitting: Radiation Oncology

## 2023-01-31 DIAGNOSIS — C50412 Malignant neoplasm of upper-outer quadrant of left female breast: Secondary | ICD-10-CM | POA: Diagnosis not present

## 2023-01-31 DIAGNOSIS — Z170 Estrogen receptor positive status [ER+]: Secondary | ICD-10-CM | POA: Diagnosis not present

## 2023-01-31 DIAGNOSIS — Z510 Encounter for antineoplastic radiation therapy: Secondary | ICD-10-CM | POA: Diagnosis not present

## 2023-01-31 LAB — RAD ONC ARIA SESSION SUMMARY
Course Elapsed Days: 9
Plan Fractions Treated to Date: 8
Plan Prescribed Dose Per Fraction: 2.67 Gy
Plan Total Fractions Prescribed: 15
Plan Total Prescribed Dose: 40.05 Gy
Reference Point Dosage Given to Date: 21.36 Gy
Reference Point Session Dosage Given: 2.67 Gy
Session Number: 8

## 2023-02-03 ENCOUNTER — Other Ambulatory Visit: Payer: Self-pay

## 2023-02-03 ENCOUNTER — Ambulatory Visit
Admission: RE | Admit: 2023-02-03 | Discharge: 2023-02-03 | Disposition: A | Discharge status: Home / Self Care | Payer: BC Managed Care – PPO | Source: Ambulatory Visit | Attending: Radiation Oncology

## 2023-02-03 ENCOUNTER — Ambulatory Visit
Admission: RE | Admit: 2023-02-03 | Discharge: 2023-02-03 | Disposition: A | Discharge status: Home / Self Care | Payer: BC Managed Care – PPO | Source: Ambulatory Visit | Attending: Radiation Oncology | Admitting: Radiation Oncology

## 2023-02-03 DIAGNOSIS — Z170 Estrogen receptor positive status [ER+]: Secondary | ICD-10-CM | POA: Diagnosis not present

## 2023-02-03 DIAGNOSIS — Z510 Encounter for antineoplastic radiation therapy: Secondary | ICD-10-CM | POA: Diagnosis not present

## 2023-02-03 DIAGNOSIS — C50412 Malignant neoplasm of upper-outer quadrant of left female breast: Secondary | ICD-10-CM | POA: Diagnosis not present

## 2023-02-03 LAB — RAD ONC ARIA SESSION SUMMARY
Course Elapsed Days: 12
Plan Fractions Treated to Date: 9
Plan Prescribed Dose Per Fraction: 2.67 Gy
Plan Total Fractions Prescribed: 15
Plan Total Prescribed Dose: 40.05 Gy
Reference Point Dosage Given to Date: 24.03 Gy
Reference Point Session Dosage Given: 2.67 Gy
Session Number: 9

## 2023-02-04 ENCOUNTER — Ambulatory Visit
Admission: RE | Admit: 2023-02-04 | Discharge: 2023-02-04 | Disposition: A | Discharge status: Home / Self Care | Payer: BC Managed Care – PPO | Source: Ambulatory Visit | Attending: Radiation Oncology | Admitting: Radiation Oncology

## 2023-02-04 ENCOUNTER — Other Ambulatory Visit: Payer: Self-pay

## 2023-02-04 DIAGNOSIS — Z170 Estrogen receptor positive status [ER+]: Secondary | ICD-10-CM | POA: Diagnosis not present

## 2023-02-04 DIAGNOSIS — C50412 Malignant neoplasm of upper-outer quadrant of left female breast: Secondary | ICD-10-CM | POA: Diagnosis not present

## 2023-02-04 DIAGNOSIS — Z510 Encounter for antineoplastic radiation therapy: Secondary | ICD-10-CM | POA: Diagnosis not present

## 2023-02-04 LAB — RAD ONC ARIA SESSION SUMMARY
Course Elapsed Days: 13
Plan Fractions Treated to Date: 10
Plan Prescribed Dose Per Fraction: 2.67 Gy
Plan Total Fractions Prescribed: 15
Plan Total Prescribed Dose: 40.05 Gy
Reference Point Dosage Given to Date: 26.7 Gy
Reference Point Session Dosage Given: 2.67 Gy
Session Number: 10

## 2023-02-05 ENCOUNTER — Other Ambulatory Visit: Payer: Self-pay

## 2023-02-05 ENCOUNTER — Ambulatory Visit
Admission: RE | Admit: 2023-02-05 | Discharge: 2023-02-05 | Disposition: A | Discharge status: Home / Self Care | Payer: BC Managed Care – PPO | Source: Ambulatory Visit | Attending: Radiation Oncology | Admitting: Radiation Oncology

## 2023-02-05 DIAGNOSIS — Z510 Encounter for antineoplastic radiation therapy: Secondary | ICD-10-CM | POA: Diagnosis not present

## 2023-02-05 DIAGNOSIS — Z170 Estrogen receptor positive status [ER+]: Secondary | ICD-10-CM | POA: Diagnosis not present

## 2023-02-05 DIAGNOSIS — C50412 Malignant neoplasm of upper-outer quadrant of left female breast: Secondary | ICD-10-CM | POA: Diagnosis not present

## 2023-02-05 LAB — RAD ONC ARIA SESSION SUMMARY
Course Elapsed Days: 14
Plan Fractions Treated to Date: 11
Plan Prescribed Dose Per Fraction: 2.67 Gy
Plan Total Fractions Prescribed: 15
Plan Total Prescribed Dose: 40.05 Gy
Reference Point Dosage Given to Date: 29.37 Gy
Reference Point Session Dosage Given: 2.67 Gy
Session Number: 11

## 2023-02-06 ENCOUNTER — Ambulatory Visit
Admission: RE | Admit: 2023-02-06 | Discharge: 2023-02-06 | Disposition: A | Discharge status: Home / Self Care | Payer: BC Managed Care – PPO | Source: Ambulatory Visit | Attending: Radiation Oncology | Admitting: Radiation Oncology

## 2023-02-06 ENCOUNTER — Other Ambulatory Visit: Payer: Self-pay

## 2023-02-06 DIAGNOSIS — Z170 Estrogen receptor positive status [ER+]: Secondary | ICD-10-CM | POA: Diagnosis not present

## 2023-02-06 DIAGNOSIS — Z510 Encounter for antineoplastic radiation therapy: Secondary | ICD-10-CM | POA: Diagnosis not present

## 2023-02-06 DIAGNOSIS — C50412 Malignant neoplasm of upper-outer quadrant of left female breast: Secondary | ICD-10-CM | POA: Diagnosis not present

## 2023-02-06 LAB — RAD ONC ARIA SESSION SUMMARY
Course Elapsed Days: 15
Plan Fractions Treated to Date: 12
Plan Prescribed Dose Per Fraction: 2.67 Gy
Plan Total Fractions Prescribed: 15
Plan Total Prescribed Dose: 40.05 Gy
Reference Point Dosage Given to Date: 32.04 Gy
Reference Point Session Dosage Given: 2.67 Gy
Session Number: 12

## 2023-02-07 ENCOUNTER — Other Ambulatory Visit: Payer: Self-pay

## 2023-02-07 ENCOUNTER — Ambulatory Visit
Admission: RE | Admit: 2023-02-07 | Discharge: 2023-02-07 | Disposition: A | Discharge status: Home / Self Care | Payer: BC Managed Care – PPO | Source: Ambulatory Visit | Attending: Radiation Oncology | Admitting: Radiation Oncology

## 2023-02-07 DIAGNOSIS — C50412 Malignant neoplasm of upper-outer quadrant of left female breast: Secondary | ICD-10-CM | POA: Diagnosis not present

## 2023-02-07 DIAGNOSIS — Z510 Encounter for antineoplastic radiation therapy: Secondary | ICD-10-CM | POA: Diagnosis not present

## 2023-02-07 DIAGNOSIS — Z170 Estrogen receptor positive status [ER+]: Secondary | ICD-10-CM | POA: Diagnosis not present

## 2023-02-07 LAB — RAD ONC ARIA SESSION SUMMARY
Course Elapsed Days: 16
Plan Fractions Treated to Date: 13
Plan Prescribed Dose Per Fraction: 2.67 Gy
Plan Total Fractions Prescribed: 15
Plan Total Prescribed Dose: 40.05 Gy
Reference Point Dosage Given to Date: 34.71 Gy
Reference Point Session Dosage Given: 2.67 Gy
Session Number: 13

## 2023-02-10 ENCOUNTER — Ambulatory Visit
Admission: RE | Admit: 2023-02-10 | Discharge: 2023-02-10 | Disposition: A | Discharge status: Home / Self Care | Payer: BC Managed Care – PPO | Source: Ambulatory Visit | Attending: Radiation Oncology

## 2023-02-10 ENCOUNTER — Ambulatory Visit: Payer: BC Managed Care – PPO | Admitting: Radiation Oncology

## 2023-02-10 ENCOUNTER — Other Ambulatory Visit: Payer: Self-pay

## 2023-02-10 DIAGNOSIS — C50412 Malignant neoplasm of upper-outer quadrant of left female breast: Secondary | ICD-10-CM | POA: Diagnosis not present

## 2023-02-10 DIAGNOSIS — Z170 Estrogen receptor positive status [ER+]: Secondary | ICD-10-CM | POA: Diagnosis not present

## 2023-02-10 DIAGNOSIS — Z510 Encounter for antineoplastic radiation therapy: Secondary | ICD-10-CM | POA: Diagnosis not present

## 2023-02-10 LAB — RAD ONC ARIA SESSION SUMMARY
Course Elapsed Days: 19
Plan Fractions Treated to Date: 14
Plan Prescribed Dose Per Fraction: 2.67 Gy
Plan Total Fractions Prescribed: 15
Plan Total Prescribed Dose: 40.05 Gy
Reference Point Dosage Given to Date: 37.38 Gy
Reference Point Session Dosage Given: 2.67 Gy
Session Number: 14

## 2023-02-11 ENCOUNTER — Other Ambulatory Visit: Payer: Self-pay

## 2023-02-11 ENCOUNTER — Ambulatory Visit
Admission: RE | Admit: 2023-02-11 | Discharge: 2023-02-11 | Disposition: A | Discharge status: Home / Self Care | Payer: BC Managed Care – PPO | Source: Ambulatory Visit | Attending: Radiation Oncology | Admitting: Radiation Oncology

## 2023-02-11 DIAGNOSIS — Z170 Estrogen receptor positive status [ER+]: Secondary | ICD-10-CM | POA: Diagnosis not present

## 2023-02-11 DIAGNOSIS — Z510 Encounter for antineoplastic radiation therapy: Secondary | ICD-10-CM | POA: Diagnosis not present

## 2023-02-11 DIAGNOSIS — C50412 Malignant neoplasm of upper-outer quadrant of left female breast: Secondary | ICD-10-CM | POA: Diagnosis not present

## 2023-02-11 LAB — RAD ONC ARIA SESSION SUMMARY
Course Elapsed Days: 20
Plan Fractions Treated to Date: 15
Plan Prescribed Dose Per Fraction: 2.67 Gy
Plan Total Fractions Prescribed: 15
Plan Total Prescribed Dose: 40.05 Gy
Reference Point Dosage Given to Date: 40.05 Gy
Reference Point Session Dosage Given: 2.67 Gy
Session Number: 15

## 2023-02-12 ENCOUNTER — Other Ambulatory Visit: Payer: Self-pay

## 2023-02-12 ENCOUNTER — Ambulatory Visit
Admission: RE | Admit: 2023-02-12 | Discharge: 2023-02-12 | Disposition: A | Discharge status: Home / Self Care | Payer: BC Managed Care – PPO | Source: Ambulatory Visit | Attending: Radiation Oncology | Admitting: Radiation Oncology

## 2023-02-12 DIAGNOSIS — Z170 Estrogen receptor positive status [ER+]: Secondary | ICD-10-CM | POA: Diagnosis not present

## 2023-02-12 DIAGNOSIS — Z510 Encounter for antineoplastic radiation therapy: Secondary | ICD-10-CM | POA: Diagnosis not present

## 2023-02-12 DIAGNOSIS — C50412 Malignant neoplasm of upper-outer quadrant of left female breast: Secondary | ICD-10-CM | POA: Diagnosis not present

## 2023-02-12 LAB — RAD ONC ARIA SESSION SUMMARY
Course Elapsed Days: 21
Plan Fractions Treated to Date: 1
Plan Prescribed Dose Per Fraction: 2 Gy
Plan Total Fractions Prescribed: 5
Plan Total Prescribed Dose: 10 Gy
Reference Point Dosage Given to Date: 2 Gy
Reference Point Session Dosage Given: 2 Gy
Session Number: 16

## 2023-02-13 ENCOUNTER — Ambulatory Visit
Admission: RE | Admit: 2023-02-13 | Discharge: 2023-02-13 | Disposition: A | Discharge status: Home / Self Care | Payer: BC Managed Care – PPO | Source: Ambulatory Visit | Attending: Radiation Oncology | Admitting: Radiation Oncology

## 2023-02-13 ENCOUNTER — Other Ambulatory Visit: Payer: Self-pay

## 2023-02-13 DIAGNOSIS — Z170 Estrogen receptor positive status [ER+]: Secondary | ICD-10-CM | POA: Diagnosis not present

## 2023-02-13 DIAGNOSIS — C50412 Malignant neoplasm of upper-outer quadrant of left female breast: Secondary | ICD-10-CM | POA: Diagnosis not present

## 2023-02-13 LAB — RAD ONC ARIA SESSION SUMMARY
Course Elapsed Days: 22
Plan Fractions Treated to Date: 2
Plan Prescribed Dose Per Fraction: 2 Gy
Plan Total Fractions Prescribed: 5
Plan Total Prescribed Dose: 10 Gy
Reference Point Dosage Given to Date: 4 Gy
Reference Point Session Dosage Given: 2 Gy
Session Number: 17

## 2023-02-14 ENCOUNTER — Ambulatory Visit
Admission: RE | Admit: 2023-02-14 | Discharge: 2023-02-14 | Disposition: A | Discharge status: Home / Self Care | Payer: BC Managed Care – PPO | Source: Ambulatory Visit | Attending: Radiation Oncology | Admitting: Radiation Oncology

## 2023-02-14 ENCOUNTER — Other Ambulatory Visit: Payer: Self-pay

## 2023-02-14 ENCOUNTER — Telehealth: Payer: Self-pay | Admitting: Radiation Oncology

## 2023-02-14 DIAGNOSIS — Z170 Estrogen receptor positive status [ER+]: Secondary | ICD-10-CM | POA: Diagnosis not present

## 2023-02-14 DIAGNOSIS — C50412 Malignant neoplasm of upper-outer quadrant of left female breast: Secondary | ICD-10-CM | POA: Diagnosis not present

## 2023-02-14 LAB — RAD ONC ARIA SESSION SUMMARY
Course Elapsed Days: 23
Plan Fractions Treated to Date: 3
Plan Prescribed Dose Per Fraction: 2 Gy
Plan Total Fractions Prescribed: 5
Plan Total Prescribed Dose: 10 Gy
Reference Point Dosage Given to Date: 6 Gy
Reference Point Session Dosage Given: 2 Gy
Session Number: 18

## 2023-02-14 NOTE — Telephone Encounter (Signed)
10/25 @ 8:58 am Patient call to let someone know she is running late for her treatment appointment and should be no more then 5 mins late.  Called and spoke to [place] ([employer]) so they are aware.

## 2023-02-17 ENCOUNTER — Other Ambulatory Visit: Payer: Self-pay

## 2023-02-17 ENCOUNTER — Ambulatory Visit
Admission: RE | Admit: 2023-02-17 | Discharge: 2023-02-17 | Disposition: A | Discharge status: Home / Self Care | Payer: BC Managed Care – PPO | Source: Ambulatory Visit | Attending: Radiation Oncology | Admitting: Radiation Oncology

## 2023-02-17 DIAGNOSIS — Z170 Estrogen receptor positive status [ER+]: Secondary | ICD-10-CM | POA: Diagnosis not present

## 2023-02-17 DIAGNOSIS — C50412 Malignant neoplasm of upper-outer quadrant of left female breast: Secondary | ICD-10-CM | POA: Diagnosis not present

## 2023-02-17 LAB — RAD ONC ARIA SESSION SUMMARY
Course Elapsed Days: 26
Plan Fractions Treated to Date: 4
Plan Prescribed Dose Per Fraction: 2 Gy
Plan Total Fractions Prescribed: 5
Plan Total Prescribed Dose: 10 Gy
Reference Point Dosage Given to Date: 8 Gy
Reference Point Session Dosage Given: 2 Gy
Session Number: 19

## 2023-02-18 ENCOUNTER — Other Ambulatory Visit: Payer: Self-pay

## 2023-02-18 ENCOUNTER — Ambulatory Visit: Payer: BC Managed Care – PPO | Admitting: Family

## 2023-02-18 ENCOUNTER — Ambulatory Visit
Admission: RE | Admit: 2023-02-18 | Discharge: 2023-02-18 | Disposition: A | Discharge status: Home / Self Care | Payer: BC Managed Care – PPO | Source: Ambulatory Visit | Attending: Radiation Oncology | Admitting: Radiation Oncology

## 2023-02-18 DIAGNOSIS — Z170 Estrogen receptor positive status [ER+]: Secondary | ICD-10-CM | POA: Diagnosis not present

## 2023-02-18 DIAGNOSIS — C50412 Malignant neoplasm of upper-outer quadrant of left female breast: Secondary | ICD-10-CM | POA: Diagnosis not present

## 2023-02-18 DIAGNOSIS — Z510 Encounter for antineoplastic radiation therapy: Secondary | ICD-10-CM | POA: Diagnosis not present

## 2023-02-18 LAB — RAD ONC ARIA SESSION SUMMARY
Course Elapsed Days: 27
Plan Fractions Treated to Date: 5
Plan Prescribed Dose Per Fraction: 2 Gy
Plan Total Fractions Prescribed: 5
Plan Total Prescribed Dose: 10 Gy
Reference Point Dosage Given to Date: 10 Gy
Reference Point Session Dosage Given: 2 Gy
Session Number: 20

## 2023-02-19 NOTE — Radiation Completion Notes (Signed)
Patient Name: Sara Browning, Sara Browning MRN: [id number] Date of Birth: 1976-02-24 Referring Physician: [place], M.D. Date of Service: 2023-02-19 Radiation Oncologist: Lonie Peak, M.D. [facility]                             RADIATION ONCOLOGY END OF TREATMENT NOTE     Diagnosis: C50.412 Malignant neoplasm of upper-outer quadrant of left female breast Staging on 2022-10-15: Malignant neoplasm of upper-outer quadrant of left breast in female, estrogen receptor positive (HCC) T=pT2, N=pN0, M=cM0 Staging on 2022-07-09: Malignant neoplasm of upper-outer quadrant of left breast in female, estrogen receptor positive (HCC) T=cT2, N=cN0, M=cM0 Intent: Curative     ==========DELIVERED PLANS==========  First Treatment Date: 2023-01-22 - Last Treatment Date: 2023-02-18   Plan Name: Breast_L_BH Site: Breast, Left Technique: 3D Mode: Photon Dose Per Fraction: 2.67 Gy Prescribed Dose (Delivered / Prescribed): 40.05 Gy / 40.05 Gy Prescribed Fxs (Delivered / Prescribed): 15 / 15   Plan Name: Brst_L_BH_Bst Site: Breast, Left Technique: 3D Mode: Photon Dose Per Fraction: 2 Gy Prescribed Dose (Delivered / Prescribed): 10 Gy / 10 Gy Prescribed Fxs (Delivered / Prescribed): 5 / 5     ==========ON TREATMENT VISIT DATES========== 2023-01-24, 2023-02-03, 2023-02-10, 2023-02-17     ==========UPCOMING VISITS==========       ==========APPENDIX - ON TREATMENT VISIT NOTES==========   See weekly On Treatment Notes in Epic for details.

## 2023-02-25 ENCOUNTER — Other Ambulatory Visit: Payer: Self-pay

## 2023-02-25 ENCOUNTER — Encounter: Payer: Self-pay | Admitting: Family

## 2023-02-25 ENCOUNTER — Ambulatory Visit (INDEPENDENT_AMBULATORY_CARE_PROVIDER_SITE_OTHER): Payer: BC Managed Care – PPO | Admitting: Family

## 2023-02-25 VITALS — BP 104/70 | HR 77 | Temp 97.9°F | Wt 162.0 lb

## 2023-02-25 DIAGNOSIS — C50412 Malignant neoplasm of upper-outer quadrant of left female breast: Secondary | ICD-10-CM | POA: Diagnosis not present

## 2023-02-25 DIAGNOSIS — B181 Chronic viral hepatitis B without delta-agent: Secondary | ICD-10-CM | POA: Diagnosis not present

## 2023-02-25 DIAGNOSIS — Z170 Estrogen receptor positive status [ER+]: Secondary | ICD-10-CM

## 2023-02-25 NOTE — Assessment & Plan Note (Signed)
Completed chemotherapy and radiation. Awaiting follow up with Oncology for next steps.

## 2023-02-25 NOTE — Progress Notes (Signed)
Subjective:    Patient ID: Sara Browning, female    DOB: 01/25/1976, 47 y.o.   MRN: [id number]  Chief Complaint  Patient presents with   Follow-up   Hepatitis B    HPI:  Sara Browning is a 47 y.o. female with chronic Hepatitis B and breast cancer last seen on 11/06/22 for follow up with good adherence and tolerance to tenofovir. Hepatitis B DNA level was undetectable at that time. Here today for follow up.  Ms. Wisener has been doing well since her last office visit and continues to take her Vemlidy as prescribed with no adverse side effects or problems obtaining medication. No current symptoms and denies abdominal pain, nausea, vomiting, fatigue, fever, scleral icterus or jaundice. Has completed her last round of radiation last week and last chemotherapy towards the end of August. Feeling fatigued but otherwise doing well.    No Known Allergies    Outpatient Medications Prior to Visit  Medication Sig Dispense Refill   loratadine (CLARITIN) 10 MG tablet Take 10 mg by mouth daily as needed for allergies.     magic mouthwash (nystatin, lidocaine, diphenhydrAMINE, alum & mag hydroxide) suspension Swish and swallow 5-10 mls by mouth 4 times a day as needed 240 mL 1   Melatonin 5 MG CHEW Chew 5 mg by mouth at bedtime as needed (sleep).     Multiple Vitamin (MULTIVITAMIN) tablet Take 1 tablet by mouth daily.     ondansetron (ZOFRAN) 8 MG tablet Take 1 tablet (8 mg total) by mouth every 8 (eight) hours as needed for nausea or vomiting. Start on the third day after chemotherapy. 30 tablet 1   OVER THE COUNTER MEDICATION Take 1 Scoop by mouth daily. prolean greens supplement powder     oxyCODONE (OXY IR/ROXICODONE) 5 MG immediate release tablet Take 1 tablet (5 mg total) by mouth every 6 (six) hours as needed for severe pain. 15 tablet 0   prochlorperazine (COMPAZINE) 10 MG tablet Take 1 tablet (10 mg total) by mouth every 6 (six) hours as needed for nausea or vomiting. 30 tablet 1    Tenofovir Alafenamide Fumarate (VEMLIDY) 25 MG TABS Take 1 tablet (25 mg total) by mouth daily. 30 tablet 5   dexamethasone (DECADRON) 4 MG tablet Take 2 tabs by mouth 2 times daily starting day before chemo. Then take 2 tabs daily for 2 days starting day after chemo, then take 1 tab daily x 3 days. Take with food. (Patient not taking: Reported on 01/08/2023) 30 tablet 1   OVER THE COUNTER MEDICATION Take 2 capsules by mouth daily. Graviola supplement (Patient not taking: Reported on 01/08/2023)     No facility-administered medications prior to visit.     Past Medical History:  Diagnosis Date   Cancer (HCC) 2024   Left Breat Cancer   Hepatitis    Hep B Carrier   Hepatitis 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)    Pneumonia    Hx   Urticaria      Past Surgical History:  Procedure Laterality Date   BREAST BIOPSY Left 07/30/2022   [place] LT RADIOACTIVE SEED LOC 07/30/2022 [facility] MAMMOGRAP[facility]   BREAST LUMPECTOMY WITH RADIOACTIVE SEED AND SENTINEL LYMPH NODE BIOPSY Left 08/01/2022   Procedure: LEFT BREAST LUMPECTOMY WITH RADIOACTIVE SEED AND SENTINEL LYMPH NODE BIOPSY;  Surgeon: Harriette Bouillon, MD;  Location: [facility] OR;  Service: General;  Laterality: Left;   CESAREAN SECTION     twice       Review of Systems  Constitutional:  Negative for chills, fatigue, fever and unexpected weight change.  Respiratory:  Negative for cough, chest tightness, shortness of breath and wheezing.   Cardiovascular:  Negative for chest pain and leg swelling.  Gastrointestinal:  Negative for abdominal distention, constipation, diarrhea, nausea and vomiting.  Neurological:  Negative for dizziness, weakness, light-headedness and headaches.  Hematological:  Does not bruise/bleed easily.      Objective:    BP 104/70   Pulse 77   Temp 97.9 F (36.6 C) (Oral)   Wt 162 lb (73.5 kg)   LMP 11/08/2022   SpO2 100%   BMI 28.70 kg/m  Nursing note and vital signs reviewed.  Physical Exam Constitutional:      General: She is not in acute  distress.    Appearance: She is well-developed.  Cardiovascular:     Rate and Rhythm: Normal rate and regular rhythm.     Heart sounds: Normal heart sounds. No murmur heard.    No friction rub. No gallop.  Pulmonary:     Effort: Pulmonary effort is normal. No respiratory distress.     Breath sounds: Normal breath sounds. No wheezing or rales.  Chest:     Chest wall: No tenderness.  Abdominal:     General: Bowel sounds are normal. There is no distension.     Palpations: Abdomen is soft. There is no mass.     Tenderness: There is no abdominal tenderness. There is no guarding or rebound.  Skin:    General: Skin is warm and dry.  Neurological:     Mental Status: She is alert and oriented to person, place, and time.  Psychiatric:        Behavior: Behavior normal.        Thought Content: Thought content normal.        Judgment: Judgment normal.         11/06/2022    9:30 AM 09/24/2022    2:45 PM  Depression screen PHQ 2/9  Decreased Interest 0 0  Down, Depressed, Hopeless 0 0  PHQ - 2 Score 0 0       Assessment & Plan:    Patient Active Problem List   Diagnosis Date Noted   Chronic viral hepatitis B without delta agent and without coma (HCC) 09/25/2022   Genetic testing 07/25/2022   Malignant neoplasm of upper-outer quadrant of left breast in female, estrogen receptor positive (HCC) 07/09/2022     Problem List Items Addressed This Visit       Digestive   Chronic viral hepatitis B without delta agent and without coma (HCC) - Primary    Ms. Westendorf continues to do well with good adherence and tolerance to [facility]. Check lab work. Discussed plan of care now that therapy is completed will continue with [facility] for 1 additional month and then stop medication and continue to monitor although remains at low risk of flare. Plan for follow up in 4 months or sooner if needed.       Relevant Orders   CBC with Differential/Platelet   Hepatitis B DNA, ultraquantitative, PCR    Hepatitis B surface antibody,qualitative     Other   Malignant neoplasm of upper-outer quadrant of left breast in female, estrogen receptor positive (HCC)    Completed chemotherapy and radiation. Awaiting follow up with Oncology for next steps.       Relevant Orders   CBC with Differential/Platelet   Hepatitis B DNA, ultraquantitative, PCR   Hepatitis B surface antibody,qualitative  I am having Briauna D. Guagliardo maintain her multivitamin, OVER THE COUNTER MEDICATION, OVER THE COUNTER MEDICATION, Melatonin, loratadine, oxyCODONE, ondansetron, prochlorperazine, Vemlidy, dexamethasone, and (magic mouthwash (nystatin, lidocaine, diphenhydrAMINE, alum & mag hydroxide) suspension).   Follow-up: Return in about 4 months (around 06/25/2023). or sooner if needed.   Marcos Eke, MSN, FNP-C Nurse Practitioner [facility] for Infectious Disease [facility] Main number: [phone]

## 2023-02-25 NOTE — Patient Instructions (Signed)
Nice to see you.  We will check your lab work today.  Continue to take your medication daily as prescribed.  Plan to stop medication in about 1 month pending lab work.   Plan for follow up in 4 months or sooner if needed with lab work on the same day.  Have a great day and stay safe!

## 2023-02-25 NOTE — Assessment & Plan Note (Signed)
Sara Browning continues to do well with good adherence and tolerance to [facility]. Check lab work. Discussed plan of care now that therapy is completed will continue with [facility] for 1 additional month and then stop medication and continue to monitor although remains at low risk of flare. Plan for follow up in 4 months or sooner if needed.

## 2023-02-27 LAB — CBC WITH DIFFERENTIAL/PLATELET
Absolute Lymphocytes: 1365 {cells}/uL (ref 850–3900)
Absolute Monocytes: 353 {cells}/uL (ref 200–950)
Basophils Absolute: 8 {cells}/uL (ref 0–200)
Basophils Relative: 0.2 %
Eosinophils Absolute: 113 {cells}/uL (ref 15–500)
Eosinophils Relative: 2.7 %
HCT: 40.9 % (ref 35.0–45.0)
Hemoglobin: 13.4 g/dL (ref 11.7–15.5)
MCH: 28.3 pg (ref 27.0–33.0)
MCHC: 32.8 g/dL (ref 32.0–36.0)
MCV: 86.3 fL (ref 80.0–100.0)
MPV: 10.9 fL (ref 7.5–12.5)
Monocytes Relative: 8.4 %
Neutro Abs: 2360 {cells}/uL (ref 1500–7800)
Neutrophils Relative %: 56.2 %
Platelets: 203 10*3/uL (ref 140–400)
RBC: 4.74 10*6/uL (ref 3.80–5.10)
RDW: 12 % (ref 11.0–15.0)
Total Lymphocyte: 32.5 %
WBC: 4.2 10*3/uL (ref 3.8–10.8)

## 2023-02-27 LAB — HEPATITIS B DNA, ULTRAQUANTITATIVE, PCR
Hepatitis B DNA: NOT DETECTED [IU]/mL
Hepatitis B virus DNA: NOT DETECTED {Log_IU}/mL

## 2023-02-27 LAB — HEPATITIS B SURFACE ANTIBODY,QUALITATIVE: Hep B S Ab: NONREACTIVE

## 2023-03-03 ENCOUNTER — Ambulatory Visit: Payer: BC Managed Care – PPO

## 2023-03-03 ENCOUNTER — Other Ambulatory Visit (HOSPITAL_COMMUNITY): Payer: Self-pay

## 2023-03-03 ENCOUNTER — Encounter (HOSPITAL_COMMUNITY): Payer: Self-pay

## 2023-03-04 ENCOUNTER — Inpatient Hospital Stay: Payer: BC Managed Care – PPO | Admitting: Hematology and Oncology

## 2023-03-04 ENCOUNTER — Ambulatory Visit: Payer: BC Managed Care – PPO | Admitting: Hematology and Oncology

## 2023-03-04 ENCOUNTER — Inpatient Hospital Stay: Payer: BC Managed Care – PPO

## 2023-03-04 ENCOUNTER — Telehealth: Payer: Self-pay

## 2023-03-04 NOTE — Telephone Encounter (Signed)
Pt called and states she needed to r/s appts. She has a personal issue today and can't not make it. She was r/s for 11/14 at 0915 lab and 0945 MD visit.

## 2023-03-05 ENCOUNTER — Other Ambulatory Visit (HOSPITAL_COMMUNITY): Payer: Self-pay

## 2023-03-06 ENCOUNTER — Other Ambulatory Visit: Payer: BC Managed Care – PPO | Attending: Hematology and Oncology

## 2023-03-06 ENCOUNTER — Inpatient Hospital Stay (HOSPITAL_BASED_OUTPATIENT_CLINIC_OR_DEPARTMENT_OTHER): Payer: BC Managed Care – PPO | Admitting: Hematology and Oncology

## 2023-03-06 ENCOUNTER — Other Ambulatory Visit: Payer: Self-pay | Admitting: Pharmacist

## 2023-03-06 ENCOUNTER — Inpatient Hospital Stay: Payer: BC Managed Care – PPO

## 2023-03-06 VITALS — BP 109/49 | HR 74 | Temp 97.5°F | Resp 16 | Wt 161.1 lb

## 2023-03-06 DIAGNOSIS — Z170 Estrogen receptor positive status [ER+]: Secondary | ICD-10-CM

## 2023-03-06 DIAGNOSIS — B181 Chronic viral hepatitis B without delta-agent: Secondary | ICD-10-CM | POA: Insufficient documentation

## 2023-03-06 DIAGNOSIS — C50412 Malignant neoplasm of upper-outer quadrant of left female breast: Secondary | ICD-10-CM | POA: Diagnosis not present

## 2023-03-06 DIAGNOSIS — Z800 Family history of malignant neoplasm of digestive organs: Secondary | ICD-10-CM | POA: Insufficient documentation

## 2023-03-06 DIAGNOSIS — B1920 Unspecified viral hepatitis C without hepatic coma: Secondary | ICD-10-CM | POA: Insufficient documentation

## 2023-03-06 DIAGNOSIS — Z1721 Progesterone receptor positive status: Secondary | ICD-10-CM | POA: Insufficient documentation

## 2023-03-06 DIAGNOSIS — Z1732 Human epidermal growth factor receptor 2 negative status: Secondary | ICD-10-CM | POA: Diagnosis not present

## 2023-03-06 DIAGNOSIS — Z803 Family history of malignant neoplasm of breast: Secondary | ICD-10-CM | POA: Insufficient documentation

## 2023-03-06 DIAGNOSIS — Z9221 Personal history of antineoplastic chemotherapy: Secondary | ICD-10-CM | POA: Diagnosis not present

## 2023-03-06 DIAGNOSIS — Z923 Personal history of irradiation: Secondary | ICD-10-CM | POA: Insufficient documentation

## 2023-03-06 LAB — CMP (CANCER CENTER ONLY)
ALT: 13 U/L (ref 0–44)
AST: 22 U/L (ref 15–41)
Albumin: 4.3 g/dL (ref 3.5–5.0)
Alkaline Phosphatase: 81 U/L (ref 38–126)
Anion gap: 5 (ref 5–15)
BUN: 14 mg/dL (ref 6–20)
CO2: 31 mmol/L (ref 22–32)
Calcium: 10.1 mg/dL (ref 8.9–10.3)
Chloride: 103 mmol/L (ref 98–111)
Creatinine: 0.8 mg/dL (ref 0.44–1.00)
GFR, Estimated: >60 mL/min (ref >60)
Glucose, Bld: 96 mg/dL (ref 70–99)
Potassium: 4.2 mmol/L (ref 3.5–5.1)
Sodium: 139 mmol/L (ref 135–145)
Total Bilirubin: 0.5 mg/dL (ref <1.2)
Total Protein: 7.7 g/dL (ref 6.5–8.1)

## 2023-03-06 LAB — CBC WITH DIFFERENTIAL/PLATELET
Abs Immature Granulocytes: 0.01 10*3/uL (ref 0.00–0.07)
Basophils Absolute: 0 10*3/uL (ref 0.0–0.1)
Basophils Relative: 1 %
Eosinophils Absolute: 0.1 10*3/uL (ref 0.0–0.5)
Eosinophils Relative: 2 %
HCT: 40.9 % (ref 36.0–46.0)
Hemoglobin: 13.9 g/dL (ref 12.0–15.0)
Immature Granulocytes: 0 %
Lymphocytes Relative: 37 %
Lymphs Abs: 1.5 10*3/uL (ref 0.7–4.0)
MCH: 28.3 pg (ref 26.0–34.0)
MCHC: 34 g/dL (ref 30.0–36.0)
MCV: 83.1 fL (ref 80.0–100.0)
Monocytes Absolute: 0.4 10*3/uL (ref 0.1–1.0)
Monocytes Relative: 9 %
Neutro Abs: 2.1 10*3/uL (ref 1.7–7.7)
Neutrophils Relative %: 51 %
Platelets: 184 10*3/uL (ref 150–400)
RBC: 4.92 MIL/uL (ref 3.87–5.11)
RDW: 11.9 % (ref 11.5–15.5)
WBC: 4 10*3/uL (ref 4.0–10.5)
nRBC: 0 % (ref 0.0–0.2)

## 2023-03-06 LAB — PREGNANCY, URINE: Preg Test, Ur: NEGATIVE

## 2023-03-06 MED ORDER — TAMOXIFEN CITRATE 20 MG PO TABS: 20.0000 mg | ORAL_TABLET | Freq: Every day | ORAL | 3 refills | Status: DC

## 2023-03-06 NOTE — Progress Notes (Signed)
Cancer Center Cancer Follow up:    Sara Morales, NP [address] [place] [address]   DIAGNOSIS:  Cancer Staging  Malignant neoplasm of upper-outer quadrant of left breast in female, estrogen receptor positive (HCC) Staging form: Breast, AJCC 8th Edition - Clinical: Stage IIA (cT2, cN0, cM0, G3, ER+, PR+, HER2-) - Signed by [name], MD on 07/09/2022 Histologic grading system: 3 grade system - Pathologic: Stage IB (pT2, pN0, cM0, G3, ER+, PR+, HER2-) - Signed by [name], NP on 10/15/2022 Histologic grading system: 3 grade system   SUMMARY OF ONCOLOGIC HISTORY: Oncology History  Malignant neoplasm of upper-outer quadrant of left breast in female, estrogen receptor positive (HCC)  07/09/2022 Initial Diagnosis   Malignant neoplasm of upper-outer quadrant of left breast in female, estrogen receptor positive (HCC)   07/09/2022 Cancer Staging   Staging form: Breast, AJCC 8th Edition - Clinical: Stage IIA (cT2, cN0, cM0, G3, ER+, PR+, HER2-) - Signed by [name], MD on 07/09/2022 Histologic grading system: 3 grade system    Genetic Testing   [facility] Multi-Cancer Panel+RNA was Negative. Of note, a variant of uncertain significance was detected in the HOXB13 gene (c.208C>T) and MUTYH gene (c.1547C>T). Report date is 07/24/2022.  The Multi-Cancer + RNA Panel offered by [facility] includes sequencing and/or deletion/duplication analysis of the following 70 genes:  AIP*, ALK, APC*, ATM*, AXIN2*, BAP1*, BARD1*, BLM*, BMPR1A*, BRCA1*, BRCA2*, BRIP1*, CDC73*, CDH1*, CDK4, CDKN1B*, CDKN2A, CHEK2*, CTNNA1*, DICER1*, EPCAM (del/dup only), EGFR, FH*, FLCN*, GREM1 (promoter dup only), HOXB13, KIT, LZTR1, MAX*, MBD4, MEN1*, MET, MITF, MLH1*, MSH2*, MSH3*, MSH6*, MUTYH*, NF1*, NF2*, NTHL1*, PALB2*, PDGFRA, PMS2*, POLD1*, POLE*, POT1*, PRKAR1A*, PTCH1*, PTEN*, RAD51C*, RAD51D*, RB1*, RET, SDHA* (sequencing only), SDHAF2*, SDHB*, SDHC*, SDHD*, SMAD4*, SMARCA4*, SMARCB1*,  SMARCE1*, STK11*, SUFU*, TMEM127*, TP53*, TSC1*, TSC2*, VHL*. RNA analysis is performed for * genes.   08/01/2022 Surgery   Left breast Lumpectomy: IDC, 3.1cm, grade 3, margins negative, no LVI, ER/PR+, Ki-67 70%, HER2-.  9 SLN negative.   08/01/2022 Oncotype testing   24/10%, indicating approximate 6.5% CT benefit. She was not a candidate for OFSET trial because of Hep C status   10/15/2022 -  Chemotherapy   Patient is on Treatment Plan : BREAST TC q21d     10/15/2022 Cancer Staging   Staging form: Breast, AJCC 8th Edition - Pathologic: Stage IB (pT2, pN0, cM0, G3, ER+, PR+, HER2-) - Signed by [name], NP on 10/15/2022 Histologic grading system: 3 grade system     CURRENT THERAPY: zoladex with tamoxifen. INTERVAL HISTORY:  Sara Browning 47 y.o. female returns for f/u.    Discussed the use of AI scribe software for clinical note transcription with the patient, who gave verbal consent to proceed.  History of Present Illness    The patient, with a history of breast cancer, has completed chemotherapy and radiation. She is premenopausal and the next step in her treatment plan is to start tamoxifen. The patient experienced hot flashes during chemotherapy and is aware that these may continue or worsen with the new treatment plan. She also discussed potential side effects such as vaginal dryness and decreased sexual desire.  The patient has been focusing on eating and has gained weight. The patient is planning to return to the gym as she recovers from treatment.  The patient also has a sore area from radiation treatment, which is improving. She has switched to a natural deodorant. She continues on tenofovir, recently had Hep labs drawn, all is  well. She will complete tenofovir next month.  Rest of the pertinent 10 point ROS reviewed and neg  Patient Active Problem List   Diagnosis Date Noted   Chronic viral hepatitis B without delta agent and without coma (HCC)  09/25/2022   Genetic testing 07/25/2022   Malignant neoplasm of upper-outer quadrant of left breast in female, estrogen receptor positive (HCC) 07/09/2022    has No Known Allergies.  MEDICAL HISTORY: Past Medical History:  Diagnosis Date   Cancer (HCC) 2024   Left Breat Cancer   Hepatitis    Hep B Carrier   Hepatitis 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)    Pneumonia    Hx   Urticaria     SURGICAL HISTORY: Past Surgical History:  Procedure Laterality Date   BREAST BIOPSY Left 07/30/2022   [place] LT RADIOACTIVE SEED LOC 07/30/2022 [facility] MAMMOGRAPHY   BREAST LUMPECTOMY WITH RADIOACTIVE SEED AND SENTINEL LYMPH NODE BIOPSY Left 08/01/2022   Procedure: LEFT BREAST LUMPECTOMY WITH RADIOACTIVE SEED AND SENTINEL LYMPH NODE BIOPSY;  Surgeon: Harriette Bouillon, MD;  Location: [facility] OR;  Service: General;  Laterality: Left;   CESAREAN SECTION     twice    SOCIAL HISTORY: Social History   Socioeconomic History   Marital status: Married    Spouse name: Not on file   Number of children: Not on file   Years of education: Not on file   Highest education level: Not on file  Occupational History   Not on file  Tobacco Use   Smoking status: Never   Smokeless tobacco: Never   Tobacco comments:    Drinks seldom  Vaping Use   Vaping status: Never Used  Substance and Sexual Activity   Alcohol use: Not Currently    Comment: occasionally   Drug use: Never   Sexual activity: Yes  Other Topics Concern   Not on file  Social History Narrative   Not on file   Social Determinants of Health   Financial Resource Strain: Not on file  Food Insecurity: No Food Insecurity (07/19/2022)   Hunger Vital Sign    Worried About Running Out of Food in the Last Year: Never true    Ran Out of Food in the Last Year: Never true  Transportation Needs: No Transportation Needs (07/19/2022)   PRAPARE - Administrator, Civil Service (Medical): No    Lack of Transportation (Non-Medical): No  Physical Activity: Not on file   Stress: Not on file  Social Connections: Not on file  Intimate Partner Violence: Not At Risk (01/08/2023)   Humiliation, Afraid, Rape, and Kick questionnaire    Fear of Current or Ex-Partner: No    Emotionally Abused: No    Physically Abused: No    Sexually Abused: No    FAMILY HISTORY: Family History  Problem Relation Age of Onset   Liver cancer Maternal Aunt    Breast cancer Maternal Grandmother    Hypertension Paternal Grandmother    Diabetes Paternal Grandfather     PHYSICAL EXAMINATION    Vitals:   03/06/23 0936  BP: (!) 109/49  Pulse: 74  Resp: 16  Temp: (!) 97.5 F (36.4 C)  SpO2: 100%    Physical Exam Constitutional:      General: She is not in acute distress.    Appearance: Normal appearance. She is not ill-appearing or toxic-appearing.  HENT:     Head: Normocephalic and atraumatic.     Mouth/Throat:     Mouth: Mucous membranes are moist.  Pharynx: Oropharynx is clear. No oropharyngeal exudate or posterior oropharyngeal erythema.  Eyes:     General: No scleral icterus. Cardiovascular:     Rate and Rhythm: Regular rhythm.     Pulses: Normal pulses.     Heart sounds: Normal heart sounds.  Pulmonary:     Effort: Pulmonary effort is normal.     Breath sounds: Normal breath sounds.  Chest:     Comments: Left breast with ongoing postradiation changes, skin desquamation. Abdominal:     General: Abdomen is flat. Bowel sounds are normal. There is no distension.     Palpations: Abdomen is soft.     Tenderness: There is no abdominal tenderness.  Musculoskeletal:        General: No swelling.     Cervical back: Neck supple.  Lymphadenopathy:     Cervical: No cervical adenopathy.  Skin:    General: Skin is warm and dry.     Findings: No rash.  Neurological:     General: No focal deficit present.     Mental Status: She is alert.  Psychiatric:        Mood and Affect: Mood normal.        Behavior: Behavior normal.     LABORATORY DATA:  CBC     Component Value Date/Time   WBC 4.0 03/06/2023 0916   RBC 4.92 03/06/2023 0916   HGB 13.9 03/06/2023 0916   HGB 10.9 (L) 12/18/2022 1119   HGB 14.9 03/28/2020 1118   HCT 40.9 03/06/2023 0916   HCT 43.6 03/28/2020 1118   PLT 184 03/06/2023 0916   PLT 331 12/18/2022 1119   PLT 257 03/28/2020 1118   MCV 83.1 03/06/2023 0916   MCV 84 03/28/2020 1118   MCH 28.3 03/06/2023 0916   MCHC 34.0 03/06/2023 0916   RDW 11.9 03/06/2023 0916   RDW 11.9 03/28/2020 1118   LYMPHSABS 1.5 03/06/2023 0916   LYMPHSABS 2.5 03/28/2020 1118   MONOABS 0.4 03/06/2023 0916   EOSABS 0.1 03/06/2023 0916   EOSABS 0.1 03/28/2020 1118   BASOSABS 0.0 03/06/2023 0916   BASOSABS 0.0 03/28/2020 1118    CMP     Component Value Date/Time   NA 138 01/20/2023 1141   NA 136 03/28/2020 1118   K 4.4 01/20/2023 1141   CL 106 01/20/2023 1141   CO2 27 01/20/2023 1141   GLUCOSE 105 (H) 01/20/2023 1141   BUN 20 01/20/2023 1141   BUN 11 03/28/2020 1118   CREATININE 0.72 01/20/2023 1141   CALCIUM 9.2 01/20/2023 1141   PROT 6.5 01/20/2023 1141   PROT 7.5 03/28/2020 1118   ALBUMIN 3.8 01/20/2023 1141   ALBUMIN 4.3 03/28/2020 1118   AST 16 01/20/2023 1141   ALT 6 01/20/2023 1141   ALT 12 09/24/2022 1523   ALKPHOS 61 01/20/2023 1141   BILITOT 0.4 01/20/2023 1141   GFRNONAA >60 01/20/2023 1141   GFRAA 102 03/28/2020 1118       ASSESSMENT and THERAPY PLAN:   Malignant neoplasm of upper-outer quadrant of left breast in female, estrogen receptor positive (HCC) Adriana is a 47 year old woman with history of stage IB ER/PR positive breast cancer s/p lumpectomy, now receiving adjuvant chemotherapy with taxotere and cytoxan here today for f/u after receiving her first cycle last week.  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   Treatment Plan:  Lumpectomy Adjuvant chemotherapy Adjuvant  radiation  Antiestrogen therapy with OFS   Breast Cancer - Post Chemotherapy and Radiation Discussed the need for hormonal therapy.  We have discussed about OFS with tamoxifen versus OFS with AI versus tamoxifen alone.  We have agreed to try the OFS with tamoxifen at this time. -Start Tamoxifen, prescription sent to [facility]. -Start Zoladex injections, initially monthly. Schedule the first injection for one week from today. -Plan to transition to Zoladex every three months if tolerated. -Return in 3-4 months for follow-up, or sooner if any issues arise. -She understands adverse effects from tamoxifen including postmenopausal symptoms such as hot flashes, vaginal discharge, increased risk of DVT/PE, endometrial hyperplasia and rarely endometrial malignancy.  General Health Maintenance Discussed the importance of maintaining a healthy lifestyle, including weight training, protein intake, and hydration. -Encouraged to continue with a balanced diet and regular exercise. -Complete CMP blood work today.   All questions were answered. The patient knows to call the clinic with any problems, questions or concerns. We can certainly see the patient much sooner if necessary.  Total encounter time:30 minutes*in face-to-face visit time, chart review, lab review, care coordination, order entry, and documentation of the encounter time.   *Total Encounter Time as defined by the Centers for Medicare and Medicaid Services includes, in addition to the face-to-face time of a patient visit (documented in the note above) non-face-to-face time: obtaining and reviewing outside history, ordering and reviewing medications, tests or procedures, care coordination (communications with other health care professionals or caregivers) and documentation in the medical record.

## 2023-03-06 NOTE — Assessment & Plan Note (Signed)
Sara Browning is a 47 year old woman with history of stage IB ER/PR positive breast cancer s/p lumpectomy, now receiving adjuvant chemotherapy with taxotere and cytoxan here today for f/u after receiving her first cycle last week.  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   Treatment Plan:  Lumpectomy Adjuvant chemotherapy Adjuvant radiation Antiestrogen therapy with OFS   Breast Cancer - Post Chemotherapy and Radiation Discussed the need for hormonal therapy.  We have discussed about OFS with tamoxifen versus OFS with AI versus tamoxifen alone.  We have agreed to try the OFS with tamoxifen at this time. -Start Tamoxifen, prescription sent to [facility]. -Start Zoladex injections, initially monthly. Schedule the first injection for one week from today. -Plan to transition to Zoladex every three months if tolerated. -Return in 3-4 months for follow-up, or sooner if any issues arise. -She understands adverse effects from tamoxifen including postmenopausal symptoms such as hot flashes, vaginal discharge, increased risk of DVT/PE, endometrial hyperplasia and rarely endometrial malignancy.  General Health Maintenance Discussed the importance of maintaining a healthy lifestyle, including weight training, protein intake, and hydration. -Encouraged to continue with a balanced diet and regular exercise. -Complete CMP blood work today.

## 2023-03-06 NOTE — Progress Notes (Signed)
Specialty Pharmacy Ongoing Clinical Assessment Note  Sara Browning is a 47 y.o. female who is being followed by the specialty pharmacy service for RxSp Hepatitis B   Patient's specialty medication(s) reviewed today: Tenofovir Alafenamide Fumarate   Missed doses in the last 4 weeks: 0   Patient/Caregiver did not have any additional questions or concerns.   Therapeutic benefit summary: Patient is achieving benefit   Adverse events/side effects summary: No adverse events/side effects   Patient's therapy is appropriate to: Continue    Goals Addressed             This Visit's Progress    Achieve sustained HBV viral load suppression       Patient is on track. Patient will maintain adherence      Comply with lab assessments       Patient is on track. Patient will adhere to provider and/or lab appointments      Maintain optimal adherence to therapy       Patient is on track. Patient will maintain adherence         Follow up:  6 months  Jennette Kettle Specialty Pharmacist

## 2023-03-07 ENCOUNTER — Telehealth: Payer: Self-pay | Admitting: Hematology and Oncology

## 2023-03-07 NOTE — Telephone Encounter (Signed)
Called patient/ unable to leave vm (mailbox full)

## 2023-03-09 ENCOUNTER — Other Ambulatory Visit: Payer: Self-pay

## 2023-03-13 ENCOUNTER — Encounter: Payer: Self-pay | Admitting: Radiation Oncology

## 2023-03-13 ENCOUNTER — Ambulatory Visit
Admission: RE | Admit: 2023-03-13 | Discharge: 2023-03-13 | Disposition: A | Discharge status: Home / Self Care | Payer: BC Managed Care – PPO | Source: Ambulatory Visit | Attending: Radiation Oncology | Admitting: Radiation Oncology

## 2023-03-13 VITALS — Ht 63.0 in | Wt 161.0 lb

## 2023-03-13 DIAGNOSIS — C50412 Malignant neoplasm of upper-outer quadrant of left female breast: Secondary | ICD-10-CM

## 2023-03-13 DIAGNOSIS — D0512 Intraductal carcinoma in situ of left breast: Secondary | ICD-10-CM

## 2023-03-13 NOTE — Progress Notes (Addendum)
Sara Browning is here today for follow up post radiation to the breast.   Breast Side: LT   They completed their radiation on: 02/18/2023  Does the patient complain of any of the following: Post radiation skin issues: None Breast Tenderness: None Breast Swelling: None Lymphadema: None Range of Motion limitations: None Fatigue post radiation: None Appetite good/fair/poor: Good Hormone Therapy Start: 03/14/2023  Additional comments if applicable: None  This concludes the interaction.  Ruel Favors, LPN

## 2023-03-17 ENCOUNTER — Ambulatory Visit: Payer: BC Managed Care – PPO | Attending: Hematology and Oncology

## 2023-03-17 DIAGNOSIS — Z483 Aftercare following surgery for neoplasm: Secondary | ICD-10-CM | POA: Insufficient documentation

## 2023-03-19 ENCOUNTER — Other Ambulatory Visit (HOSPITAL_COMMUNITY): Payer: Self-pay

## 2023-03-24 ENCOUNTER — Other Ambulatory Visit: Payer: Self-pay

## 2023-03-25 ENCOUNTER — Ambulatory Visit: Payer: BC Managed Care – PPO | Admitting: Family

## 2023-03-25 ENCOUNTER — Other Ambulatory Visit (HOSPITAL_COMMUNITY): Payer: Self-pay

## 2023-03-26 ENCOUNTER — Telehealth: Payer: Self-pay | Admitting: *Deleted

## 2023-03-26 ENCOUNTER — Other Ambulatory Visit: Payer: Self-pay

## 2023-03-26 NOTE — Telephone Encounter (Signed)
Scheduled appointment per inbasket message. Left voicemail with appointment details.

## 2023-03-27 ENCOUNTER — Other Ambulatory Visit: Payer: Self-pay

## 2023-04-04 ENCOUNTER — Inpatient Hospital Stay: Payer: BC Managed Care – PPO | Attending: Hematology and Oncology

## 2023-04-04 VITALS — BP 142/74 | HR 70 | Resp 16

## 2023-04-04 DIAGNOSIS — Z5111 Encounter for antineoplastic chemotherapy: Secondary | ICD-10-CM | POA: Insufficient documentation

## 2023-04-04 DIAGNOSIS — C50412 Malignant neoplasm of upper-outer quadrant of left female breast: Secondary | ICD-10-CM | POA: Insufficient documentation

## 2023-04-04 DIAGNOSIS — Z170 Estrogen receptor positive status [ER+]: Secondary | ICD-10-CM | POA: Insufficient documentation

## 2023-04-04 MED ORDER — GOSERELIN ACETATE 3.6 MG ~~LOC~~ IMPL
3.6000 mg | DRUG_IMPLANT | Freq: Once | SUBCUTANEOUS | Status: AC
  Administered 2023-04-04: 3.6 mg via SUBCUTANEOUS
  Filled 2023-04-04: qty 3.6

## 2023-04-04 NOTE — Patient Instructions (Signed)
Goserelin Implant What is this medication? GOSERELIN (GOE se rel in) treats prostate cancer and breast cancer. It works by decreasing levels of the hormones testosterone and estrogen in the body. This prevents prostate and breast cancer cells from spreading or growing. It may also be used to treat endometriosis. This is a condition where the tissue that lines the uterus grows outside the uterus. It works by decreasing the amount of estrogen your body makes, which reduces heavy bleeding and pain. It can also be used to help thin the lining of the uterus before a surgery used to prevent or reduce heavy periods. This medicine may be used for other purposes; ask your health care provider or pharmacist if you have questions. COMMON BRAND NAME(S): Zoladex, Zoladex 3-Month What should I tell my care team before I take this medication? They need to know if you have any of these conditions: Bone problems Diabetes Heart disease History of irregular heartbeat or rhythm An unusual or allergic reaction to goserelin, other medications, foods, dyes, or preservatives Pregnant or trying to get pregnant Breastfeeding How should I use this medication? This medication is injected under the skin. It is given by your care team in a hospital or clinic setting. Talk to your care team about the use of this medication in children. Special care may be needed. Overdosage: If you think you have taken too much of this medicine contact a poison control center or emergency room at once. NOTE: This medicine is only for you. Do not share this medicine with others. What if I miss a dose? Keep appointments for follow-up doses. It is important not to miss your dose. Call your care team if you are unable to keep an appointment. What may interact with this medication? Do not take this medication with any of the following: Cisapride Dronedarone Pimozide Thioridazine This medication may also interact with the following: Other  medications that cause heart rhythm changes This list may not describe all possible interactions. Give your health care provider a list of all the medicines, herbs, non-prescription drugs, or dietary supplements you use. Also tell them if you smoke, drink alcohol, or use illegal drugs. Some items may interact with your medicine. What should I watch for while using this medication? Visit your care team for regular checks on your progress. Your symptoms may appear to get worse during the first weeks of this therapy. Tell your care team if your symptoms do not start to get better or if they get worse after this time. Using this medication for a long time may weaken your bones. If you smoke or frequently drink alcohol you may increase your risk of bone loss. A family history of osteoporosis, chronic use of medications for seizures (convulsions), or corticosteroids can also increase your risk of bone loss. The risk of bone fractures may be increased. Talk to your care team about your bone health. This medication may increase blood sugar. The risk may be higher in patients who already have diabetes. Ask your care team what you can do to lower your risk of diabetes while taking this medication. This medication should stop regular monthly menstruation in women. Tell your care team if you continue to menstruate. Talk to your care team if you wish to become pregnant or think you might be pregnant. This medication can cause serious birth defects if taken during pregnancy or for 12 weeks after stopping treatment. Talk to your care team about reliable forms of contraception. Do not breastfeed while taking this   medication. This medication may cause infertility. Talk to your care team if you are concerned about your fertility. What side effects may I notice from receiving this medication? Side effects that you should report to your care team as soon as possible: Allergic reactions--skin rash, itching, hives, swelling  of the face, lips, tongue, or throat Change in the amount of urine Heart attack--pain or tightness in the chest, shoulders, arms, or jaw, nausea, shortness of breath, cold or clammy skin, feeling faint or lightheaded Heart rhythm changes--fast or irregular heartbeat, dizziness, feeling faint or lightheaded, chest pain, trouble breathing High blood sugar (hyperglycemia)--increased thirst or amount of urine, unusual weakness or fatigue, blurry vision High calcium level--increased thirst or amount of urine, nausea, vomiting, confusion, unusual weakness or fatigue, bone pain Pain, redness, irritation, or bruising at the injection site Severe back pain, numbness or weakness of the hands, arms, legs, or feet, loss of coordination, loss of bowel or bladder control Stroke--sudden numbness or weakness of the face, arm, or leg, trouble speaking, confusion, trouble walking, loss of balance or coordination, dizziness, severe headache, change in vision Swelling and pain of the tumor site or lymph nodes Trouble passing urine Side effects that usually do not require medical attention (report to your care team if they continue or are bothersome): Change in sex drive or performance Headache Hot flashes Rapid or extreme change in emotion or mood Sweating Swelling of the ankles, hands, or feet Unusual vaginal discharge, itching, or odor This list may not describe all possible side effects. Call your doctor for medical advice about side effects. You may report side effects to FDA at 1-800-FDA-1088. Where should I keep my medication? This medication is given in a hospital or clinic. It will not be stored at home. NOTE: This sheet is a summary. It may not cover all possible information. If you have questions about this medicine, talk to your doctor, pharmacist, or health care provider.  2024 Elsevier/Gold Standard (2021-08-30 00:00:00)  

## 2023-04-07 ENCOUNTER — Inpatient Hospital Stay: Payer: BC Managed Care – PPO

## 2023-04-17 DIAGNOSIS — C50412 Malignant neoplasm of upper-outer quadrant of left female breast: Secondary | ICD-10-CM | POA: Diagnosis not present

## 2023-04-17 DIAGNOSIS — J00 Acute nasopharyngitis [common cold]: Secondary | ICD-10-CM | POA: Diagnosis not present

## 2023-04-17 DIAGNOSIS — Z6828 Body mass index (BMI) 28.0-28.9, adult: Secondary | ICD-10-CM | POA: Diagnosis not present

## 2023-04-17 DIAGNOSIS — R0981 Nasal congestion: Secondary | ICD-10-CM | POA: Diagnosis not present

## 2023-04-17 DIAGNOSIS — Z170 Estrogen receptor positive status [ER+]: Secondary | ICD-10-CM | POA: Diagnosis not present

## 2023-05-07 ENCOUNTER — Inpatient Hospital Stay: Payer: BC Managed Care – PPO | Attending: Hematology and Oncology

## 2023-05-07 VITALS — BP 113/65 | HR 81 | Temp 98.1°F | Resp 16

## 2023-05-07 DIAGNOSIS — C50412 Malignant neoplasm of upper-outer quadrant of left female breast: Secondary | ICD-10-CM | POA: Diagnosis not present

## 2023-05-07 DIAGNOSIS — Z170 Estrogen receptor positive status [ER+]: Secondary | ICD-10-CM | POA: Insufficient documentation

## 2023-05-07 DIAGNOSIS — Z5111 Encounter for antineoplastic chemotherapy: Secondary | ICD-10-CM | POA: Insufficient documentation

## 2023-05-07 DIAGNOSIS — Z1721 Progesterone receptor positive status: Secondary | ICD-10-CM | POA: Insufficient documentation

## 2023-05-07 MED ORDER — GOSERELIN ACETATE 3.6 MG ~~LOC~~ IMPL
3.6000 mg | DRUG_IMPLANT | Freq: Once | SUBCUTANEOUS | Status: AC
Start: 2023-05-07 — End: 2023-05-07
  Administered 2023-05-07: 3.6 mg via SUBCUTANEOUS
  Filled 2023-05-07: qty 3.6

## 2023-05-13 ENCOUNTER — Ambulatory Visit: Payer: BC Managed Care – PPO | Admitting: Family

## 2023-05-14 ENCOUNTER — Other Ambulatory Visit (HOSPITAL_COMMUNITY): Payer: Self-pay

## 2023-06-09 ENCOUNTER — Telehealth: Payer: Self-pay | Admitting: Genetic Counselor

## 2023-06-09 ENCOUNTER — Encounter: Payer: Self-pay | Admitting: Hematology and Oncology

## 2023-06-09 ENCOUNTER — Inpatient Hospital Stay: Payer: BC Managed Care – PPO

## 2023-06-09 ENCOUNTER — Inpatient Hospital Stay: Payer: BC Managed Care – PPO | Attending: Hematology and Oncology | Admitting: Hematology and Oncology

## 2023-06-09 ENCOUNTER — Ambulatory Visit: Payer: BC Managed Care – PPO | Admitting: Family

## 2023-06-09 VITALS — BP 117/70 | HR 92 | Temp 97.6°F | Resp 18 | Wt 164.2 lb

## 2023-06-09 DIAGNOSIS — C50412 Malignant neoplasm of upper-outer quadrant of left female breast: Secondary | ICD-10-CM

## 2023-06-09 DIAGNOSIS — Z79810 Long term (current) use of selective estrogen receptor modulators (SERMs): Secondary | ICD-10-CM | POA: Insufficient documentation

## 2023-06-09 DIAGNOSIS — Z5111 Encounter for antineoplastic chemotherapy: Secondary | ICD-10-CM | POA: Insufficient documentation

## 2023-06-09 DIAGNOSIS — G629 Polyneuropathy, unspecified: Secondary | ICD-10-CM | POA: Diagnosis not present

## 2023-06-09 DIAGNOSIS — R232 Flushing: Secondary | ICD-10-CM | POA: Diagnosis not present

## 2023-06-09 DIAGNOSIS — Z1721 Progesterone receptor positive status: Secondary | ICD-10-CM | POA: Diagnosis not present

## 2023-06-09 DIAGNOSIS — Z170 Estrogen receptor positive status [ER+]: Secondary | ICD-10-CM | POA: Diagnosis not present

## 2023-06-09 DIAGNOSIS — Z1732 Human epidermal growth factor receptor 2 negative status: Secondary | ICD-10-CM | POA: Insufficient documentation

## 2023-06-09 LAB — CBC WITH DIFFERENTIAL/PLATELET
Abs Immature Granulocytes: 0.02 10*3/uL (ref 0.00–0.07)
Basophils Absolute: 0 10*3/uL (ref 0.0–0.1)
Basophils Relative: 0 %
Eosinophils Absolute: 0.1 10*3/uL (ref 0.0–0.5)
Eosinophils Relative: 2 %
HCT: 37.4 % (ref 36.0–46.0)
Hemoglobin: 12.6 g/dL (ref 12.0–15.0)
Immature Granulocytes: 0 %
Lymphocytes Relative: 42 %
Lymphs Abs: 2.4 10*3/uL (ref 0.7–4.0)
MCH: 27.6 pg (ref 26.0–34.0)
MCHC: 33.7 g/dL (ref 30.0–36.0)
MCV: 81.8 fL (ref 80.0–100.0)
Monocytes Absolute: 0.4 10*3/uL (ref 0.1–1.0)
Monocytes Relative: 7 %
Neutro Abs: 2.8 10*3/uL (ref 1.7–7.7)
Neutrophils Relative %: 49 %
Platelets: 242 10*3/uL (ref 150–400)
RBC: 4.57 MIL/uL (ref 3.87–5.11)
RDW: 13.8 % (ref 11.5–15.5)
WBC: 5.8 10*3/uL (ref 4.0–10.5)
nRBC: 0 % (ref 0.0–0.2)

## 2023-06-09 LAB — PREGNANCY, URINE: Preg Test, Ur: NEGATIVE

## 2023-06-09 MED ORDER — GOSERELIN ACETATE 3.6 MG ~~LOC~~ IMPL
3.6000 mg | DRUG_IMPLANT | Freq: Once | SUBCUTANEOUS | Status: AC
  Administered 2023-06-09: 3.6 mg via SUBCUTANEOUS
  Filled 2023-06-09: qty 3.6

## 2023-06-09 NOTE — Assessment & Plan Note (Signed)
 Sara Browning is a 48 year old woman with history of stage IB ER/PR positive breast cancer s/p lumpectomy, now receiving adjuvant chemotherapy with taxotere and cytoxan here today for f/u after receiving her first cycle last week.  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   Treatment Plan:  Lumpectomy Adjuvant chemotherapy Adjuvant radiation Antiestrogen therapy with OFS  Breast Cancer Patient is on Tamoxifen and monthly Zoladex shots. Experiencing hot flashes, which are improving. No mood swings reported. Noted some vaginal dryness and decreased libido, likely due to Zoladex. -Continue Tamoxifen and Zoladex as prescribed. -Consider use of lubricants for vaginal dryness and discomfort during intercourse. -Refer to pelvic rehabilitation if symptoms persist or worsen.  Genetic Testing Patient received a bill for genetic testing that she believes is not covered by her insurance. -Instructed patient to contact the billing department to clarify the charges and to contact the office with the information for further assistance.  Hepatitis Patient previously on Tenofovir, but has stopped. No current issues reported. -Continue monitoring as per hepatologist's plan.  General Health Maintenance / Followup Plans -Next mammogram due in March 2025. -Schedule follow-up visit in six months. -Continue monthly Zoladex shots. -Check liver function profile at next visit with hepatologist.

## 2023-06-09 NOTE — Telephone Encounter (Signed)
 Per Dr. Al Pimple, patient said she received bill for genetic testing.  Called [facility]--they said they are still processing claim and have not billed the patient yet.  Called patient to notify her of this (may have been EOB, may have been for other testing, etc).  Provided our CB number if patient had questions and encouraged her to call [facility] with questions about their billing process.

## 2023-06-09 NOTE — Progress Notes (Signed)
 [facility] Cancer Follow up:    Sara Morales, NP [address] [place] [address]   DIAGNOSIS:  Cancer Staging  Malignant neoplasm of upper-outer quadrant of left breast in female, estrogen receptor positive (HCC) Staging form: Breast, AJCC 8th Edition - Clinical: Stage IIA (cT2, cN0, cM0, G3, ER+, PR+, HER2-) - Signed by [name], MD on 07/09/2022 Histologic grading system: 3 grade system - Pathologic: Stage IB (pT2, pN0, cM0, G3, ER+, PR+, HER2-) - Signed by [name], NP on 10/15/2022 Histologic grading system: 3 grade system   SUMMARY OF ONCOLOGIC HISTORY: Oncology History  Malignant neoplasm of upper-outer quadrant of left breast in female, estrogen receptor positive (HCC)  07/09/2022 Initial Diagnosis   Malignant neoplasm of upper-outer quadrant of left breast in female, estrogen receptor positive (HCC)   07/09/2022 Cancer Staging   Staging form: Breast, AJCC 8th Edition - Clinical: Stage IIA (cT2, cN0, cM0, G3, ER+, PR+, HER2-) - Signed by [name], MD on 07/09/2022 Histologic grading system: 3 grade system    Genetic Testing   [facility] Multi-Cancer Panel+RNA was Negative. Of note, a variant of uncertain significance was detected in the HOXB13 gene (c.208C>T) and MUTYH gene (c.1547C>T). Report date is 07/24/2022.  The Multi-Cancer + RNA Panel offered by [facility] includes sequencing and/or deletion/duplication analysis of the following 70 genes:  AIP*, ALK, APC*, ATM*, AXIN2*, BAP1*, BARD1*, BLM*, BMPR1A*, BRCA1*, BRCA2*, BRIP1*, CDC73*, CDH1*, CDK4, CDKN1B*, CDKN2A, CHEK2*, CTNNA1*, DICER1*, EPCAM (del/dup only), EGFR, FH*, FLCN*, GREM1 (promoter dup only), HOXB13, KIT, LZTR1, MAX*, MBD4, MEN1*, MET, MITF, MLH1*, MSH2*, MSH3*, MSH6*, MUTYH*, NF1*, NF2*, NTHL1*, PALB2*, PDGFRA, PMS2*, POLD1*, POLE*, POT1*, PRKAR1A*, PTCH1*, PTEN*, RAD51C*, RAD51D*, RB1*, RET, SDHA* (sequencing only), SDHAF2*, SDHB*, SDHC*, SDHD*, SMAD4*, SMARCA4*, SMARCB1*,  SMARCE1*, STK11*, SUFU*, TMEM127*, TP53*, TSC1*, TSC2*, VHL*. RNA analysis is performed for * genes.   08/01/2022 Surgery   Left breast Lumpectomy: IDC, 3.1cm, grade 3, margins negative, no LVI, ER/PR+, Ki-67 70%, HER2-.  9 SLN negative.   08/01/2022 Oncotype testing   24/10%, indicating approximate 6.5% CT benefit. She was not a candidate for OFSET trial because of Hep C status   10/15/2022 - 12/20/2022 Chemotherapy   Patient is on Treatment Plan : BREAST TC q21d     10/15/2022 Cancer Staging   Staging form: Breast, AJCC 8th Edition - Pathologic: Stage IB (pT2, pN0, cM0, G3, ER+, PR+, HER2-) - Signed by [name], NP on 10/15/2022 Histologic grading system: 3 grade system     CURRENT THERAPY: zoladex with tamoxifen.  INTERVAL HISTORY:  Sara Browning 48 y.o. female returns for f/u.    Discussed the use of AI scribe software for clinical note transcription with the patient, who gave verbal consent to proceed.  History of Present Illness    Sara Browning is a 48 year old female with breast cancer who presents for follow-up regarding treatment side effects.  She is experiencing hot flashes, which have been improving with the cold weather. The hot flashes are less severe at night, although she still experiences fluctuations between feeling cold and hot. She has been on tamoxifen and a zoladex which may contribute to the hot flashes. Initially, the hot flashes were challenging, but she feels she is adjusting to the medication.  She reports occasional neuropathy in her legs, particularly after sitting for long periods, such as during her drive from [place] to [place]. Her energy levels are not as high as she used to be, but she is  gradually improving and has started going back to the gym. No significant residual neuropathy is reported.  She mentions receiving a bill for genetic testing, which she believes should be covered by insurance. She plans to follow up with the  billing department to clarify the charges.  No mood swings are reported, and she feels that the side effects of her current treatment are manageable compared to her previous chemotherapy experience.  Rest of the pertinent 10 point ROS reviewed and neg  Patient Active Problem List   Diagnosis Date Noted   Chronic viral hepatitis B without delta agent and without coma (HCC) 09/25/2022   Genetic testing 07/25/2022   Malignant neoplasm of upper-outer quadrant of left breast in female, estrogen receptor positive (HCC) 07/09/2022    has no known allergies.  MEDICAL HISTORY: Past Medical History:  Diagnosis Date   Cancer (HCC) 2024   Left Breat Cancer   Hepatitis    Hep B Carrier   Hepatitis 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)    Pneumonia    Hx   Urticaria     SURGICAL HISTORY: Past Surgical History:  Procedure Laterality Date   BREAST BIOPSY Left 07/30/2022   [place] LT RADIOACTIVE SEED LOC 07/30/2022 [facility] MAMMOGR[facility]   BREAST LUMPECTOMY WITH RADIOACTIVE SEED AND SENTINEL LYMPH NODE BIOPSY Left 08/01/2022   Procedure: LEFT BREAST LUMPECTOMY WITH RADIOACTIVE SEED AND SENTINEL LYMPH NODE BIOPSY;  Surgeon: Harriette Bouillon, MD;  Location: [facility] OR;  Service: General;  Laterality: Left;   CESAREAN SECTION     twice    SOCIAL HISTORY: Social History   Socioeconomic History   Marital status: Married    Spouse name: Not on file   Number of children: Not on file   Years of education: Not on file   Highest education level: Not on file  Occupational History   Not on file  Tobacco Use   Smoking status: Never   Smokeless tobacco: Never   Tobacco comments:    Drinks seldom  Vaping Use   Vaping status: Never Used  Substance and Sexual Activity   Alcohol use: Not Currently    Comment: occasionally   Drug use: Never   Sexual activity: Yes  Other Topics Concern   Not on file  Social History Narrative   Not on file   Social Drivers of Health   Financial Resource Strain: Not on file  Food Insecurity:  No Food Insecurity (03/13/2023)   Hunger Vital Sign    Worried About Running Out of Food in the Last Year: Never true    Ran Out of Food in the Last Year: Never true  Transportation Needs: No Transportation Needs (03/13/2023)   PRAPARE - Administrator, Civil Service (Medical): No    Lack of Transportation (Non-Medical): No  Physical Activity: Not on file  Stress: Not on file  Social Connections: Not on file  Intimate Partner Violence: Not At Risk (03/13/2023)   Humiliation, Afraid, Rape, and Kick questionnaire    Fear of Current or Ex-Partner: No    Emotionally Abused: No    Physically Abused: No    Sexually Abused: No    FAMILY HISTORY: Family History  Problem Relation Age of Onset   Liver cancer Maternal Aunt    Breast cancer Maternal Grandmother    Hypertension Paternal Grandmother    Diabetes Paternal Grandfather     PHYSICAL EXAMINATION    Vitals:   06/09/23 1237  BP: 117/70  Pulse: 92  Resp: 18  Temp: 97.6  F (36.4 C)  SpO2: 99%    Physical Exam Constitutional:      General: She is not in acute distress.    Appearance: Normal appearance. She is not ill-appearing or toxic-appearing.  HENT:     Head: Normocephalic and atraumatic.     Mouth/Throat:     Mouth: Mucous membranes are moist.     Pharynx: Oropharynx is clear. No oropharyngeal exudate or posterior oropharyngeal erythema.  Eyes:     General: No scleral icterus. Cardiovascular:     Rate and Rhythm: Regular rhythm.     Pulses: Normal pulses.     Heart sounds: Normal heart sounds.  Pulmonary:     Effort: Pulmonary effort is normal.     Breath sounds: Normal breath sounds.  Chest:     Comments: Breast exam deferred. Abdominal:     General: Abdomen is flat. Bowel sounds are normal. There is no distension.     Palpations: Abdomen is soft.     Tenderness: There is no abdominal tenderness.  Musculoskeletal:        General: No swelling.     Cervical back: Neck supple.   Lymphadenopathy:     Cervical: No cervical adenopathy.  Skin:    General: Skin is warm and dry.     Findings: No rash.  Neurological:     General: No focal deficit present.     Mental Status: She is alert.  Psychiatric:        Mood and Affect: Mood normal.        Behavior: Behavior normal.     LABORATORY DATA:  CBC    Component Value Date/Time   WBC 5.8 06/09/2023 1207   RBC 4.57 06/09/2023 1207   HGB 12.6 06/09/2023 1207   HGB 10.9 (L) 12/18/2022 1119   HGB 14.9 03/28/2020 1118   HCT 37.4 06/09/2023 1207   HCT 43.6 03/28/2020 1118   PLT 242 06/09/2023 1207   PLT 331 12/18/2022 1119   PLT 257 03/28/2020 1118   MCV 81.8 06/09/2023 1207   MCV 84 03/28/2020 1118   MCH 27.6 06/09/2023 1207   MCHC 33.7 06/09/2023 1207   RDW 13.8 06/09/2023 1207   RDW 11.9 03/28/2020 1118   LYMPHSABS 2.4 06/09/2023 1207   LYMPHSABS 2.5 03/28/2020 1118   MONOABS 0.4 06/09/2023 1207   EOSABS 0.1 06/09/2023 1207   EOSABS 0.1 03/28/2020 1118   BASOSABS 0.0 06/09/2023 1207   BASOSABS 0.0 03/28/2020 1118    CMP     Component Value Date/Time   NA 139 03/06/2023 1029   NA 136 03/28/2020 1118   K 4.2 03/06/2023 1029   CL 103 03/06/2023 1029   CO2 31 03/06/2023 1029   GLUCOSE 96 03/06/2023 1029   BUN 14 03/06/2023 1029   BUN 11 03/28/2020 1118   CREATININE 0.80 03/06/2023 1029   CALCIUM 10.1 03/06/2023 1029   PROT 7.7 03/06/2023 1029   PROT 7.5 03/28/2020 1118   ALBUMIN 4.3 03/06/2023 1029   ALBUMIN 4.3 03/28/2020 1118   AST 22 03/06/2023 1029   ALT 13 03/06/2023 1029   ALT 12 09/24/2022 1523   ALKPHOS 81 03/06/2023 1029   BILITOT 0.5 03/06/2023 1029   GFRNONAA >60 03/06/2023 1029   GFRAA 102 03/28/2020 1118       ASSESSMENT and THERAPY PLAN:   Malignant neoplasm of upper-outer quadrant of left breast in female, estrogen receptor positive (HCC) Sara Browning is a 48 year old woman with history of stage IB ER/PR positive breast  cancer s/p lumpectomy, now receiving adjuvant  chemotherapy with taxotere and cytoxan here today for f/u after receiving her first cycle last week.  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   Treatment Plan:  Lumpectomy Adjuvant chemotherapy Adjuvant radiation Antiestrogen therapy with OFS  Breast Cancer Patient is on Tamoxifen and monthly Zoladex shots. Experiencing hot flashes, which are improving. No mood swings reported. Noted some vaginal dryness and decreased libido, likely due to Zoladex. -Continue Tamoxifen and Zoladex as prescribed. -Consider use of lubricants for vaginal dryness and discomfort during intercourse. -Refer to pelvic rehabilitation if symptoms persist or worsen.  Genetic Testing Patient received a bill for genetic testing that she believes is not covered by her insurance. -Instructed patient to contact the billing department to clarify the charges and to contact the office with the information for further assistance.  Hepatitis Patient previously on Tenofovir, but has stopped. No current issues reported. -Continue monitoring as per hepatologist's plan.  General Health Maintenance / Followup Plans -Next mammogram due in March 2025. -Schedule follow-up visit in six months. -Continue monthly Zoladex shots. -Check liver function profile at next visit with hepatologist.      All questions were answered. The patient knows to call the clinic with any problems, questions or concerns. We can certainly see the patient much sooner if necessary.  Total encounter time:30 minutes*in face-to-face visit time, chart review, lab review, care coordination, order entry, and documentation of the encounter time.   *Total Encounter Time as defined by the Centers for Medicare and Medicaid Services includes, in addition to the face-to-face time of a patient visit (documented in the note above) non-face-to-face  time: obtaining and reviewing outside history, ordering and reviewing medications, tests or procedures, care coordination (communications with other health care professionals or caregivers) and documentation in the medical record.

## 2023-06-10 ENCOUNTER — Encounter: Payer: Self-pay | Admitting: *Deleted

## 2023-06-11 ENCOUNTER — Telehealth: Payer: Self-pay | Admitting: Adult Health

## 2023-06-11 NOTE — Telephone Encounter (Signed)
 Called to inform the patient that Lillard Anes will not be in the office due to the weather. Informed the patient that her visit will be changed to a telephone visit. Direct line was lef in VM for patient to call back with any questions/concerns.

## 2023-06-12 ENCOUNTER — Inpatient Hospital Stay (HOSPITAL_BASED_OUTPATIENT_CLINIC_OR_DEPARTMENT_OTHER): Payer: BC Managed Care – PPO | Admitting: Adult Health

## 2023-06-12 DIAGNOSIS — Z170 Estrogen receptor positive status [ER+]: Secondary | ICD-10-CM

## 2023-06-12 DIAGNOSIS — C50412 Malignant neoplasm of upper-outer quadrant of left female breast: Secondary | ICD-10-CM

## 2023-06-12 NOTE — Progress Notes (Unsigned)
 SURVIVORSHIP VISIT:  I connected with Sara Browning on 06/12/23 at  1:00 PM EST by telephone and verified that I am speaking with the correct person using two identifiers.  I discussed the limitations, risks, security and privacy concerns of performing an evaluation and management service by telephone and the availability of in person appointments.  I also discussed with the patient that there may be a patient responsible charge related to this service. The patient expressed understanding and agreed to proceed.  Patient location: home Provider location: home  BRIEF ONCOLOGIC HISTORY:  Oncology History  Malignant neoplasm of upper-outer quadrant of left breast in female, estrogen receptor positive (HCC)  07/09/2022 Initial Diagnosis   Malignant neoplasm of upper-outer quadrant of left breast in female, estrogen receptor positive (HCC)   07/09/2022 Cancer Staging   Staging form: Breast, AJCC 8th Edition - Clinical: Stage IIA (cT2, cN0, cM0, G3, ER+, PR+, HER2-) - Signed by [name], MD on 07/09/2022 Histologic grading system: 3 grade system    Genetic Testing   [facility] Multi-Cancer Panel+RNA was Negative. Of note, a variant of uncertain significance was detected in the HOXB13 gene (c.208C>T) and MUTYH gene (c.1547C>T). Report date is 07/24/2022.  The Multi-Cancer + RNA Panel offered by [facility] includes sequencing and/or deletion/duplication analysis of the following 70 genes:  AIP*, ALK, APC*, ATM*, AXIN2*, BAP1*, BARD1*, BLM*, BMPR1A*, BRCA1*, BRCA2*, BRIP1*, CDC73*, CDH1*, CDK4, CDKN1B*, CDKN2A, CHEK2*, CTNNA1*, DICER1*, EPCAM (del/dup only), EGFR, FH*, FLCN*, GREM1 (promoter dup only), HOXB13, KIT, LZTR1, MAX*, MBD4, MEN1*, MET, MITF, MLH1*, MSH2*, MSH3*, MSH6*, MUTYH*, NF1*, NF2*, NTHL1*, PALB2*, PDGFRA, PMS2*, POLD1*, POLE*, POT1*, PRKAR1A*, PTCH1*, PTEN*, RAD51C*, RAD51D*, RB1*, RET, SDHA* (sequencing only), SDHAF2*, SDHB*, SDHC*, SDHD*, SMAD4*, SMARCA4*, SMARCB1*, SMARCE1*, STK11*,  SUFU*, TMEM127*, TP53*, TSC1*, TSC2*, VHL*. RNA analysis is performed for * genes.   08/01/2022 Surgery   Left breast Lumpectomy: IDC, 3.1cm, grade 3, margins negative, no LVI, ER/PR+, Ki-67 70%, HER2-.  9 SLN negative.   08/01/2022 Oncotype testing   24/10%, indicating approximate 6.5% CT benefit. She was not a candidate for OFSET trial because of Hep C status   10/15/2022 - 12/20/2022 Chemotherapy   Patient is on Treatment Plan : BREAST TC q21d     10/15/2022 Cancer Staging   Staging form: Breast, AJCC 8th Edition - Pathologic: Stage IB (pT2, pN0, cM0, G3, ER+, PR+, HER2-) - Signed by [name], NP on 10/15/2022 Histologic grading system: 3 grade system   01/22/2023 - 02/18/2023 Radiation Therapy   Plan Name: Breast_L_BH Site: Breast, Left Technique: 3D Mode: Photon Dose Per Fraction: 2.67 Gy Prescribed Dose (Delivered / Prescribed): 40.05 Gy / 40.05 Gy Prescribed Fxs (Delivered / Prescribed): 15 / 15   Plan Name: Brst_L_BH_Bst Site: Breast, Left Technique: 3D Mode: Photon Dose Per Fraction: 2 Gy Prescribed Dose (Delivered / Prescribed): 10 Gy / 10 Gy Prescribed Fxs (Delivered / Prescribed): 5 / 5   02/2023 -  Anti-estrogen oral therapy   Tamoxifen      INTERVAL HISTORY:  Sara Browning to review her survivorship care plan detailing her treatment course for breast cancer, as well as monitoring long-term side effects of that treatment, education regarding health maintenance, screening, and overall wellness and health promotion.     Overall, Sara Browning reports feeling quite well.She is taking tamoxifen daily and is experiencing hot flashes and occasional lower extremity arthralgias.  She notes a decreased libido.    REVIEW OF SYSTEMS:  Review of Systems  Constitutional:  Negative for appetite change, chills,  fatigue, fever and unexpected weight change.  HENT:   Negative for hearing loss, lump/mass and trouble swallowing.   Eyes:  Negative for eye problems and  icterus.  Respiratory:  Negative for chest tightness, cough and shortness of breath.   Cardiovascular:  Negative for chest pain, leg swelling and palpitations.  Gastrointestinal:  Negative for abdominal distention, abdominal pain, constipation, diarrhea, nausea and vomiting.  Endocrine: Positive for hot flashes.  Genitourinary:  Negative for difficulty urinating.   Musculoskeletal:  Positive for arthralgias.  Skin:  Negative for itching and rash.  Neurological:  Negative for dizziness, extremity weakness, headaches and numbness.  Hematological:  Negative for adenopathy. Does not bruise/bleed easily.  Psychiatric/Behavioral:  Negative for depression. The patient is not nervous/anxious.    Breast: Denies any new nodularity, masses, tenderness, nipple changes, or nipple discharge.       PAST MEDICAL/SURGICAL HISTORY:  Past Medical History:  Diagnosis Date  . Cancer [facility]) 2024   Left Breat Cancer  . Hepatitis    Hep B Carrier  . Hepatitis 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)   . Pneumonia    Hx  . Urticaria    Past Surgical History:  Procedure Laterality Date  . BREAST BIOPSY Left 07/30/2022   [place] LT RADIOACTIVE SEED LOC 07/30/2022 [facility] MAMMOGRAP[facility]  . BREAST LUMPECTOMY WITH RADIOACTIVE SEED AND SENTINEL LYMPH NODE BIOPSY Left 08/01/2022   Procedure: LEFT BREAST LUMPECTOMY WITH RADIOACTIVE SEED AND SENTINEL LYMPH NODE BIOPSY;  Surgeon: Harriette Bouillon, MD;  Location: [facility] OR;  Service: General;  Laterality: Left;  . CESAREAN SECTION     twice     ALLERGIES:  No Known Allergies   CURRENT MEDICATIONS:  Outpatient Encounter Medications as of 06/12/2023  Medication Sig  . loratadine (CLARITIN) 10 MG tablet Take 10 mg by mouth daily as needed for allergies.  . Melatonin 5 MG CHEW Chew 5 mg by mouth at bedtime as needed (sleep).  . Multiple Vitamin (MULTIVITAMIN) tablet Take 1 tablet by mouth daily.  Marland Kitchen OVER THE COUNTER MEDICATION Take 2 capsules by mouth daily. Graviola supplement (Patient not taking:  Reported on 01/08/2023)  . OVER THE COUNTER MEDICATION Take 1 Scoop by mouth daily. prolean greens supplement powder  . oxyCODONE (OXY IR/ROXICODONE) 5 MG immediate release tablet Take 1 tablet (5 mg total) by mouth every 6 (six) hours as needed for severe pain.  . tamoxifen (NOLVADEX) 20 MG tablet Take 1 tablet (20 mg total) by mouth daily.  . Tenofovir Alafenamide Fumarate (VEMLIDY) 25 MG TABS Take 1 tablet (25 mg total) by mouth daily.  . [DISCONTINUED] magic mouthwash (nystatin, lidocaine, diphenhydrAMINE, alum & mag hydroxide) suspension Swish and swallow 5-10 mls by mouth 4 times a day as needed   No facility-administered encounter medications on file as of 06/12/2023.     ONCOLOGIC FAMILY HISTORY:  Family History  Problem Relation Age of Onset  . Liver cancer Maternal Aunt   . Breast cancer Maternal Grandmother   . Hypertension Paternal Grandmother   . Diabetes Paternal Grandfather      SOCIAL HISTORY:  Social History   Socioeconomic History  . Marital status: Married    Spouse name: Not on file  . Number of children: Not on file  . Years of education: Not on file  . Highest education level: Not on file  Occupational History  . Not on file  Tobacco Use  . Smoking status: Never  . Smokeless tobacco: Never  . Tobacco comments:    Drinks seldom  Vaping Use  .  Vaping status: Never Used  Substance and Sexual Activity  . Alcohol use: Not Currently    Comment: occasionally  . Drug use: Never  . Sexual activity: Yes  Other Topics Concern  . Not on file  Social History Narrative  . Not on file   Social Drivers of Health   Financial Resource Strain: Not on file  Food Insecurity: No Food Insecurity (03/13/2023)   Hunger Vital Sign   . Worried About Programme researcher, broadcasting/film/video in the Last Year: Never true   . Ran Out of Food in the Last Year: Never true  Transportation Needs: No Transportation Needs (03/13/2023)   PRAPARE - Transportation   . Lack of Transportation  (Medical): No   . Lack of Transportation (Non-Medical): No  Physical Activity: Not on file  Stress: Not on file  Social Connections: Not on file  Intimate Partner Violence: Not At Risk (03/13/2023)   Humiliation, Afraid, Rape, and Kick questionnaire   . Fear of Current or Ex-Partner: No   . Emotionally Abused: No   . Physically Abused: No   . Sexually Abused: No     OBSERVATIONS/OBJECTIVE:  Patient sounds well.  In no apprent distress   LABORATORY DATA:  None for this visit.  DIAGNOSTIC IMAGING:  None for this visit.      ASSESSMENT AND PLAN:  Ms.. Bankert is a pleasant 48 y.o. female with Stage *** right/left breast invasive ductal carcinoma, ER+/PR+/HER2-, diagnosed in ***, treated with lumpectomy, adjuvant radiation therapy, and anti-estrogen therapy with *** beginning in ***.  She presents to the Survivorship Clinic for our initial meeting and routine follow-up post-completion of treatment for breast cancer.    1. Stage *** right/left breast cancer:  Ms. Syring is continuing to recover from definitive treatment for breast cancer. She will follow-up with her medical oncologist, Dr.  Marland Kitchen with history and physical exam per surveillance protocol.  She will continue her anti-estrogen therapy with ***. Thus far, she is tolerating the *** well, with minimal side effects. Her mammogram is due ***; orders placed today.   Today, a comprehensive survivorship care plan and treatment summary was reviewed with the patient today detailing her breast cancer diagnosis, treatment course, potential late/long-term effects of treatment, appropriate follow-up care with recommendations for the future, and patient education resources.  A copy of this summary, along with a letter will be sent to the patient's primary care provider via mail/fax/In Basket message after today's visit.    #. Problem(s) at Visit______________  #. Bone health:  Given Ms. Knippenberg's age/history of breast cancer and her current  treatment regimen including anti-estrogen therapy with ***, she is at risk for bone demineralization.  Her last DEXA scan was ***, which showed ***.  In the meantime, she was encouraged to increase her consumption of foods rich in calcium, as well as increase her weight-bearing activities.  She was given education on specific activities to promote bone health.  #. Cancer screening:  Due to Ms. Riendeau's history and her age, she should receive screening for skin cancers, colon cancer, and gynecologic cancers.  The information and recommendations are listed on the patient's comprehensive care plan/treatment summary and were reviewed in detail with the patient.    #. Health maintenance and wellness promotion: Ms. Howson was encouraged to consume 5-7 servings of fruits and vegetables per day. We reviewed the "Nutrition Rainbow" handout.  She was also encouraged to engage in moderate to vigorous exercise for 30 minutes per day most days of the  week.  She was instructed to limit her alcohol consumption and continue to abstain from tobacco use/***was encouraged stop smoking.     #. Support services/counseling: It is not uncommon for this period of the patient's cancer care trajectory to be one of many emotions and stressors.   She was given information regarding our available services and encouraged to contact me with any questions or for help enrolling in any of our support group/programs.    Follow up instructions:    -Return to cancer center ***  -Mammogram due in *** -She is welcome to return back to the Survivorship Clinic at any time; no additional follow-up needed at this time.  -Consider referral back to survivorship as a long-term survivor for continued surveillance  The patient was provided an opportunity to ask questions and all were answered. The patient agreed with the plan and demonstrated an understanding of the instructions.   Total encounter time:*** minutes*in face-to-face visit time,  chart review, lab review, care coordination, order entry, and documentation of the encounter time.    Lillard Anes, NP 06/12/23 1:12 PM Medical Oncology and Hematology [facility] [address] [place], [place] [address] Tel. [phone]    Fax. [phone]  *Total Encounter Time as defined by the Centers for Medicare and Medicaid Services includes, in addition to the face-to-face time of a patient visit (documented in the note above) non-face-to-face time: obtaining and reviewing outside history, ordering and reviewing medications, tests or procedures, care coordination (communications with other health care professionals or caregivers) and documentation in the medical record.

## 2023-06-13 ENCOUNTER — Encounter: Payer: Self-pay | Admitting: Hematology and Oncology

## 2023-06-17 ENCOUNTER — Ambulatory Visit (INDEPENDENT_AMBULATORY_CARE_PROVIDER_SITE_OTHER): Payer: BC Managed Care – PPO | Admitting: Family

## 2023-06-17 ENCOUNTER — Encounter: Payer: Self-pay | Admitting: Family

## 2023-06-17 ENCOUNTER — Other Ambulatory Visit: Payer: Self-pay

## 2023-06-17 VITALS — BP 116/69 | HR 77 | Temp 97.3°F | Wt 160.0 lb

## 2023-06-17 DIAGNOSIS — B181 Chronic viral hepatitis B without delta-agent: Secondary | ICD-10-CM | POA: Diagnosis not present

## 2023-06-17 DIAGNOSIS — Z170 Estrogen receptor positive status [ER+]: Secondary | ICD-10-CM | POA: Diagnosis not present

## 2023-06-17 DIAGNOSIS — C50412 Malignant neoplasm of upper-outer quadrant of left female breast: Secondary | ICD-10-CM

## 2023-06-17 NOTE — Assessment & Plan Note (Signed)
 Currently on tamoxifen s/p treatment with chemotherapy and radiation for breast cancer and managed by Oncology and will continue in the Surveillance Protocol.

## 2023-06-17 NOTE — Assessment & Plan Note (Signed)
 Sara Browning is doing well since stopping Vemlidy with no signs/symptoms concerning for flare. Discussed plan of care to continue to monitor off medication as she was previously found to have inactive chronic Hepatitis B. Check lab work today. Recommend continuing to monitor every 6 months and is currently at low risk for hepatocellular carcinoma. Continue to monitor off medication with plan to follow up in 6 months or sooner if needed.

## 2023-06-17 NOTE — Progress Notes (Signed)
 Subjective:    Patient ID: Sara Browning, female    DOB: 27-May-1975, 48 y.o.   MRN: [id number]  Chief Complaint  Patient presents with   Follow-up    Hepatitis B    HPI:  Sara Browning is a 48 y.o. female with chronic hepatitis B last seen on 02/25/2023 with good adherence and tolerance to [facility] for suppression as she was completing chemotherapy and radiation treatment for breast cancer.  Plan was to stop Vemlidy after 1 month at the completion of other treatments.  Here today for follow-up.  Sara Browning has been doing well since her last office visit and has stopped taking Vemlidy approximately in the middle of January and has had no adverse side effects or signs/symptoms of flare.  Having hot flashes with tamoxifen and has been seen by oncology. Denies abdominal pain, nausea, vomiting, fatigue, fever, scleral icterus or jaundice.    No Known Allergies    Outpatient Medications Prior to Visit  Medication Sig Dispense Refill   loratadine (CLARITIN) 10 MG tablet Take 10 mg by mouth daily as needed for allergies.     Melatonin 5 MG CHEW Chew 5 mg by mouth at bedtime as needed (sleep).     Multiple Vitamin (MULTIVITAMIN) tablet Take 1 tablet by mouth daily.     OVER THE COUNTER MEDICATION Take 1 Scoop by mouth daily. prolean greens supplement powder     oxyCODONE (OXY IR/ROXICODONE) 5 MG immediate release tablet Take 1 tablet (5 mg total) by mouth every 6 (six) hours as needed for severe pain. 15 tablet 0   tamoxifen (NOLVADEX) 20 MG tablet Take 1 tablet (20 mg total) by mouth daily. 90 tablet 3   OVER THE COUNTER MEDICATION Take 2 capsules by mouth daily. Graviola supplement (Patient not taking: Reported on 01/08/2023)     Tenofovir Alafenamide Fumarate (VEMLIDY) 25 MG TABS Take 1 tablet (25 mg total) by mouth daily. (Patient not taking: Reported on 06/17/2023) 30 tablet 5   No facility-administered medications prior to visit.     Past Medical History:  Diagnosis Date    Cancer (HCC) 2024   Left Breat Cancer   Hepatitis    Hep B Carrier   Hepatitis 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)    Pneumonia    Hx   Urticaria      Past Surgical History:  Procedure Laterality Date   BREAST BIOPSY Left 07/30/2022   [place] LT RADIOACTIVE SEED LOC 07/30/2022 [facility]AMMOGRAP[facility]   BREAST LUMPECTOMY WITH RADIOACTIVE SEED AND SENTINEL LYMPH NODE BIOPSY Left 08/01/2022   Procedure: LEFT BREAST LUMPECTOMY WITH RADIOACTIVE SEED AND SENTINEL LYMPH NODE BIOPSY;  Surgeon: Harriette Bouillon, MD;  Location: [facility] OR;  Service: General;  Laterality: Left;   CESAREAN SECTION     twice       Review of Systems  Constitutional:  Negative for chills, fatigue, fever and unexpected weight change.  Respiratory:  Negative for cough, chest tightness, shortness of breath and wheezing.   Cardiovascular:  Negative for chest pain and leg swelling.  Gastrointestinal:  Negative for abdominal distention, constipation, diarrhea, nausea and vomiting.  Neurological:  Negative for dizziness, weakness, light-headedness and headaches.  Hematological:  Does not bruise/bleed easily.      Objective:    BP 116/69   Pulse 77   Temp (!) 97.3 F (36.3 C) (Oral)   Wt 160 lb (72.6 kg)   LMP 09/21/2022 (Approximate)   SpO2 100%   BMI 28.34 kg/m  Nursing note and vital  signs reviewed.  Physical Exam Constitutional:      General: She is not in acute distress.    Appearance: She is well-developed.  Cardiovascular:     Rate and Rhythm: Normal rate and regular rhythm.     Heart sounds: Normal heart sounds. No murmur heard.    No friction rub. No gallop.  Pulmonary:     Effort: Pulmonary effort is normal. No respiratory distress.     Breath sounds: Normal breath sounds. No wheezing or rales.  Chest:     Chest wall: No tenderness.  Abdominal:     General: Bowel sounds are normal. There is no distension.     Palpations: Abdomen is soft. There is no mass.     Tenderness: There is no abdominal tenderness. There is no guarding  or rebound.  Skin:    General: Skin is warm and dry.  Neurological:     Mental Status: She is alert and oriented to person, place, and time.  Psychiatric:        Behavior: Behavior normal.        Thought Content: Thought content normal.        Judgment: Judgment normal.         11/06/2022    9:30 AM 09/24/2022    2:45 PM  Depression screen PHQ 2/9  Decreased Interest 0 0  Down, Depressed, Hopeless 0 0  PHQ - 2 Score 0 0       Assessment & Plan:    Patient Active Problem List   Diagnosis Date Noted   Chronic viral hepatitis B without delta agent and without coma (HCC) 09/25/2022   Genetic testing 07/25/2022   Malignant neoplasm of upper-outer quadrant of left breast in female, estrogen receptor positive (HCC) 07/09/2022     Problem List Items Addressed This Visit       Digestive   Chronic viral hepatitis B without delta agent and without coma (HCC) - Primary   [name] is doing well since stopping Vemlidy with no signs/symptoms concerning for flare. Discussed plan of care to continue to monitor off medication as she was previously found to have inactive chronic Hepatitis B. Check lab work today. Recommend continuing to monitor every 6 months and is currently at low risk for hepatocellular carcinoma. Continue to monitor off medication with plan to follow up in 6 months or sooner if needed.       Relevant Orders   BASIC METABOLIC PANEL WITH GFR   Hepatic function panel   Hepatitis B DNA, ultraquantitative, PCR     Other   Malignant neoplasm of upper-outer quadrant of left breast in female, estrogen receptor positive (HCC)   Currently on tamoxifen s/p treatment with chemotherapy and radiation for breast cancer and managed by Oncology and will continue in the Surveillance Protocol.         I have discontinued Sara Browning's Vemlidy. I am also having her maintain her multivitamin, OVER THE COUNTER MEDICATION, OVER THE COUNTER MEDICATION, Melatonin, loratadine,  oxyCODONE, and tamoxifen.   Follow-up: Return in about 6 months (around 12/15/2023). or sooner if needed.   Marcos Eke, MSN, FNP-C Nurse Practitioner [facility] Main number: [phone]

## 2023-06-17 NOTE — Patient Instructions (Addendum)
Nice to see you.  We will check your lab work today.  Plan for follow up in 6 months or sooner if needed with lab work on the same day.  Have a great day and stay safe!  

## 2023-06-18 ENCOUNTER — Other Ambulatory Visit: Payer: Self-pay

## 2023-06-18 NOTE — Progress Notes (Signed)
 Patient no longer taking Vemlidy please see attched notes from doctor on why patient is being unenrolled Thank you  Kae Heller, Pharmacy Patient Advocate CPHt

## 2023-06-19 LAB — BASIC METABOLIC PANEL WITH GFR
BUN: 17 mg/dL (ref 7–25)
CO2: 28 mmol/L (ref 20–32)
Calcium: 9.5 mg/dL (ref 8.6–10.2)
Chloride: 103 mmol/L (ref 98–110)
Creat: 0.8 mg/dL (ref 0.50–0.99)
Glucose, Bld: 99 mg/dL (ref 65–99)
Potassium: 4.6 mmol/L (ref 3.5–5.3)
Sodium: 137 mmol/L (ref 135–146)
eGFR: 93 mL/min/{1.73_m2} (ref >=60)

## 2023-06-19 LAB — HEPATITIS B DNA, ULTRAQUANTITATIVE, PCR
Hepatitis B DNA: 115 [IU]/mL — ABNORMAL HIGH
Hepatitis B virus DNA: 2.06 {Log_IU}/mL — ABNORMAL HIGH

## 2023-06-19 LAB — HEPATIC FUNCTION PANEL
AG Ratio: 1.2 (calc) (ref 1.0–2.5)
ALT: 7 U/L (ref 6–29)
AST: 18 U/L (ref 10–35)
Albumin: 4 g/dL (ref 3.6–5.1)
Alkaline phosphatase (APISO): 73 U/L (ref 31–125)
Bilirubin, Direct: 0.1 mg/dL (ref 0.0–0.2)
Globulin: 3.3 g/dL (ref 1.9–3.7)
Indirect Bilirubin: 0.3 mg/dL (ref 0.2–1.2)
Total Bilirubin: 0.4 mg/dL (ref 0.2–1.2)
Total Protein: 7.3 g/dL (ref 6.1–8.1)

## 2023-07-07 ENCOUNTER — Ambulatory Visit
Admission: RE | Admit: 2023-07-07 | Discharge: 2023-07-07 | Disposition: A | Discharge status: Home / Self Care | Payer: BC Managed Care – PPO | Source: Ambulatory Visit | Attending: Hematology and Oncology

## 2023-07-07 ENCOUNTER — Inpatient Hospital Stay: Payer: BC Managed Care – PPO | Attending: Hematology and Oncology

## 2023-07-07 DIAGNOSIS — C50412 Malignant neoplasm of upper-outer quadrant of left female breast: Secondary | ICD-10-CM | POA: Insufficient documentation

## 2023-07-07 DIAGNOSIS — Z1721 Progesterone receptor positive status: Secondary | ICD-10-CM | POA: Insufficient documentation

## 2023-07-07 DIAGNOSIS — Z170 Estrogen receptor positive status [ER+]: Secondary | ICD-10-CM | POA: Diagnosis not present

## 2023-07-07 DIAGNOSIS — Z08 Encounter for follow-up examination after completed treatment for malignant neoplasm: Secondary | ICD-10-CM | POA: Diagnosis not present

## 2023-07-07 DIAGNOSIS — Z1732 Human epidermal growth factor receptor 2 negative status: Secondary | ICD-10-CM | POA: Insufficient documentation

## 2023-07-07 DIAGNOSIS — Z853 Personal history of malignant neoplasm of breast: Secondary | ICD-10-CM | POA: Diagnosis not present

## 2023-07-07 DIAGNOSIS — Z5111 Encounter for antineoplastic chemotherapy: Secondary | ICD-10-CM | POA: Insufficient documentation

## 2023-07-07 HISTORY — DX: Personal history of antineoplastic chemotherapy: Z92.21

## 2023-07-07 HISTORY — DX: Personal history of irradiation: Z92.3

## 2023-07-07 MED ORDER — GOSERELIN ACETATE 3.6 MG ~~LOC~~ IMPL
3.6000 mg | DRUG_IMPLANT | Freq: Once | SUBCUTANEOUS | Status: AC
  Administered 2023-07-07: 3.6 mg via SUBCUTANEOUS
  Filled 2023-07-07: qty 3.6

## 2023-08-07 ENCOUNTER — Inpatient Hospital Stay: Payer: BC Managed Care – PPO | Attending: Hematology and Oncology

## 2023-08-07 VITALS — BP 124/75 | HR 96 | Temp 98.5°F | Resp 16

## 2023-08-07 DIAGNOSIS — C50412 Malignant neoplasm of upper-outer quadrant of left female breast: Secondary | ICD-10-CM | POA: Insufficient documentation

## 2023-08-07 DIAGNOSIS — Z1732 Human epidermal growth factor receptor 2 negative status: Secondary | ICD-10-CM | POA: Insufficient documentation

## 2023-08-07 DIAGNOSIS — Z170 Estrogen receptor positive status [ER+]: Secondary | ICD-10-CM | POA: Insufficient documentation

## 2023-08-07 DIAGNOSIS — Z1721 Progesterone receptor positive status: Secondary | ICD-10-CM | POA: Diagnosis not present

## 2023-08-07 DIAGNOSIS — Z5111 Encounter for antineoplastic chemotherapy: Secondary | ICD-10-CM | POA: Insufficient documentation

## 2023-08-07 MED ORDER — GOSERELIN ACETATE 3.6 MG ~~LOC~~ IMPL
3.6000 mg | DRUG_IMPLANT | Freq: Once | SUBCUTANEOUS | Status: AC
  Administered 2023-08-07: 3.6 mg via SUBCUTANEOUS
  Filled 2023-08-07: qty 3.6

## 2023-08-11 DIAGNOSIS — R0609 Other forms of dyspnea: Secondary | ICD-10-CM | POA: Diagnosis not present

## 2023-08-11 DIAGNOSIS — J069 Acute upper respiratory infection, unspecified: Secondary | ICD-10-CM | POA: Diagnosis not present

## 2023-09-05 ENCOUNTER — Inpatient Hospital Stay: Payer: BC Managed Care – PPO | Attending: Hematology and Oncology

## 2023-09-05 VITALS — BP 113/79 | HR 73 | Temp 98.4°F | Resp 17

## 2023-09-05 DIAGNOSIS — Z1721 Progesterone receptor positive status: Secondary | ICD-10-CM | POA: Insufficient documentation

## 2023-09-05 DIAGNOSIS — Z1732 Human epidermal growth factor receptor 2 negative status: Secondary | ICD-10-CM | POA: Insufficient documentation

## 2023-09-05 DIAGNOSIS — Z170 Estrogen receptor positive status [ER+]: Secondary | ICD-10-CM | POA: Diagnosis not present

## 2023-09-05 DIAGNOSIS — C50412 Malignant neoplasm of upper-outer quadrant of left female breast: Secondary | ICD-10-CM | POA: Diagnosis not present

## 2023-09-05 DIAGNOSIS — Z5111 Encounter for antineoplastic chemotherapy: Secondary | ICD-10-CM | POA: Diagnosis not present

## 2023-09-05 MED ORDER — GOSERELIN ACETATE 3.6 MG ~~LOC~~ IMPL
3.6000 mg | DRUG_IMPLANT | Freq: Once | SUBCUTANEOUS | Status: AC
Start: 2023-09-05 — End: 2023-09-05
  Administered 2023-09-05: 3.6 mg via SUBCUTANEOUS
  Filled 2023-09-05: qty 3.6

## 2023-09-08 DIAGNOSIS — Z124 Encounter for screening for malignant neoplasm of cervix: Secondary | ICD-10-CM | POA: Diagnosis not present

## 2023-09-08 DIAGNOSIS — Z01419 Encounter for gynecological examination (general) (routine) without abnormal findings: Secondary | ICD-10-CM | POA: Diagnosis not present

## 2023-09-15 DIAGNOSIS — R051 Acute cough: Secondary | ICD-10-CM | POA: Diagnosis not present

## 2023-09-15 DIAGNOSIS — B9789 Other viral agents as the cause of diseases classified elsewhere: Secondary | ICD-10-CM | POA: Diagnosis not present

## 2023-09-15 DIAGNOSIS — J988 Other specified respiratory disorders: Secondary | ICD-10-CM | POA: Diagnosis not present

## 2023-09-15 DIAGNOSIS — R059 Cough, unspecified: Secondary | ICD-10-CM | POA: Diagnosis not present

## 2023-09-15 DIAGNOSIS — R062 Wheezing: Secondary | ICD-10-CM | POA: Diagnosis not present

## 2023-09-18 ENCOUNTER — Encounter: Payer: Self-pay | Admitting: Hematology and Oncology

## 2023-09-22 DIAGNOSIS — J069 Acute upper respiratory infection, unspecified: Secondary | ICD-10-CM | POA: Diagnosis not present

## 2023-09-22 DIAGNOSIS — Z170 Estrogen receptor positive status [ER+]: Secondary | ICD-10-CM | POA: Diagnosis not present

## 2023-09-22 DIAGNOSIS — C50412 Malignant neoplasm of upper-outer quadrant of left female breast: Secondary | ICD-10-CM | POA: Diagnosis not present

## 2023-10-02 DIAGNOSIS — R110 Nausea: Secondary | ICD-10-CM | POA: Diagnosis not present

## 2023-10-02 DIAGNOSIS — Z20828 Contact with and (suspected) exposure to other viral communicable diseases: Secondary | ICD-10-CM | POA: Diagnosis not present

## 2023-10-02 DIAGNOSIS — R300 Dysuria: Secondary | ICD-10-CM | POA: Diagnosis not present

## 2023-10-02 DIAGNOSIS — R6889 Other general symptoms and signs: Secondary | ICD-10-CM | POA: Diagnosis not present

## 2023-10-07 ENCOUNTER — Inpatient Hospital Stay: Attending: Hematology and Oncology

## 2023-10-07 ENCOUNTER — Encounter: Payer: Self-pay | Admitting: Hematology and Oncology

## 2023-10-07 ENCOUNTER — Inpatient Hospital Stay: Payer: BC Managed Care – PPO

## 2023-10-07 VITALS — BP 118/74 | HR 63 | Temp 98.2°F | Resp 18

## 2023-10-07 DIAGNOSIS — Z1732 Human epidermal growth factor receptor 2 negative status: Secondary | ICD-10-CM | POA: Insufficient documentation

## 2023-10-07 DIAGNOSIS — Z170 Estrogen receptor positive status [ER+]: Secondary | ICD-10-CM | POA: Diagnosis not present

## 2023-10-07 DIAGNOSIS — Z1721 Progesterone receptor positive status: Secondary | ICD-10-CM | POA: Diagnosis not present

## 2023-10-07 DIAGNOSIS — C50412 Malignant neoplasm of upper-outer quadrant of left female breast: Secondary | ICD-10-CM | POA: Insufficient documentation

## 2023-10-07 DIAGNOSIS — Z5111 Encounter for antineoplastic chemotherapy: Secondary | ICD-10-CM | POA: Diagnosis not present

## 2023-10-07 MED ORDER — GOSERELIN ACETATE 3.6 MG ~~LOC~~ IMPL
3.6000 mg | DRUG_IMPLANT | Freq: Once | SUBCUTANEOUS | Status: AC
  Administered 2023-10-07: 3.6 mg via SUBCUTANEOUS
  Filled 2023-10-07: qty 3.6

## 2023-10-07 NOTE — Patient Instructions (Signed)
 Goserelin Implant What is this medication? GOSERELIN (GOE se rel in) treats prostate cancer and breast cancer. It works by decreasing levels of the hormones testosterone and estrogen in the body. This prevents prostate and breast cancer cells from spreading or growing. It may also be used to treat endometriosis. This is a condition where the tissue that lines the uterus grows outside the uterus. It works by decreasing the amount of estrogen your body makes, which reduces heavy bleeding and pain. It can also be used to help thin the lining of the uterus before a surgery used to prevent or reduce heavy periods. This medicine may be used for other purposes; ask your health care provider or pharmacist if you have questions. COMMON BRAND NAME(S): Zoladex, Zoladex 76-Month What should I tell my care team before I take this medication? They need to know if you have any of these conditions: Bone problems Diabetes Heart disease History of irregular heartbeat or rhythm An unusual or allergic reaction to goserelin, other medications, foods, dyes, or preservatives Pregnant or trying to get pregnant Breastfeeding How should I use this medication? This medication is injected under the skin. It is given by your care team in a hospital or clinic setting. Talk to your care team about the use of this medication in children. Special care may be needed. Overdosage: If you think you have taken too much of this medicine contact a poison control center or emergency room at once. NOTE: This medicine is only for you. Do not share this medicine with others. What if I miss a dose? Keep appointments for follow-up doses. It is important not to miss your dose. Call your care team if you are unable to keep an appointment. What may interact with this medication? Do not take this medication with any of the following: Cisapride Dronedarone Pimozide Thioridazine This medication may also interact with the following: Other  medications that cause heart rhythm changes This list may not describe all possible interactions. Give your health care provider a list of all the medicines, herbs, non-prescription drugs, or dietary supplements you use. Also tell them if you smoke, drink alcohol, or use illegal drugs. Some items may interact with your medicine. What should I watch for while using this medication? Visit your care team for regular checks on your progress. Your symptoms may appear to get worse during the first weeks of this therapy. Tell your care team if your symptoms do not start to get better or if they get worse after this time. Using this medication for a long time may weaken your bones. If you smoke or frequently drink alcohol you may increase your risk of bone loss. A family history of osteoporosis, chronic use of medications for seizures (convulsions), or corticosteroids can also increase your risk of bone loss. The risk of bone fractures may be increased. Talk to your care team about your bone health. This medication may increase blood sugar. The risk may be higher in patients who already have diabetes. Ask your care team what you can do to lower your risk of diabetes while taking this medication. This medication should stop regular monthly menstruation in women. Tell your care team if you continue to menstruate. Talk to your care team if you wish to become pregnant or think you might be pregnant. This medication can cause serious birth defects if taken during pregnancy or for 12 weeks after stopping treatment. Talk to your care team about reliable forms of contraception. Do not breastfeed while taking this  medication. This medication may cause infertility. Talk to your care team if you are concerned about your fertility. What side effects may I notice from receiving this medication? Side effects that you should report to your care team as soon as possible: Allergic reactions--skin rash, itching, hives, swelling  of the face, lips, tongue, or throat Change in the amount of urine Heart attack--pain or tightness in the chest, shoulders, arms, or jaw, nausea, shortness of breath, cold or clammy skin, feeling faint or lightheaded Heart rhythm changes--fast or irregular heartbeat, dizziness, feeling faint or lightheaded, chest pain, trouble breathing High blood sugar (hyperglycemia)--increased thirst or amount of urine, unusual weakness or fatigue, blurry vision High calcium level--increased thirst or amount of urine, nausea, vomiting, confusion, unusual weakness or fatigue, bone pain Pain, redness, irritation, or bruising at the injection site Severe back pain, numbness or weakness of the hands, arms, legs, or feet, loss of coordination, loss of bowel or bladder control Stroke--sudden numbness or weakness of the face, arm, or leg, trouble speaking, confusion, trouble walking, loss of balance or coordination, dizziness, severe headache, change in vision Swelling and pain of the tumor site or lymph nodes Trouble passing urine Side effects that usually do not require medical attention (report to your care team if they continue or are bothersome): Change in sex drive or performance Headache Hot flashes Rapid or extreme change in emotion or mood Sweating Swelling of the ankles, hands, or feet Unusual vaginal discharge, itching, or odor This list may not describe all possible side effects. Call your doctor for medical advice about side effects. You may report side effects to FDA at 1-800-FDA-1088. Where should I keep my medication? This medication is given in a hospital or clinic. It will not be stored at home. NOTE: This sheet is a summary. It may not cover all possible information. If you have questions about this medicine, talk to your doctor, pharmacist, or health care provider.  2024 Elsevier/Gold Standard (2021-08-30 00:00:00)

## 2023-11-06 ENCOUNTER — Inpatient Hospital Stay: Payer: BC Managed Care – PPO

## 2023-11-07 ENCOUNTER — Inpatient Hospital Stay: Attending: Hematology and Oncology

## 2023-11-07 VITALS — BP 121/76 | HR 85 | Temp 98.6°F | Resp 18

## 2023-11-07 DIAGNOSIS — C50412 Malignant neoplasm of upper-outer quadrant of left female breast: Secondary | ICD-10-CM | POA: Insufficient documentation

## 2023-11-07 DIAGNOSIS — Z1732 Human epidermal growth factor receptor 2 negative status: Secondary | ICD-10-CM | POA: Diagnosis not present

## 2023-11-07 DIAGNOSIS — Z1721 Progesterone receptor positive status: Secondary | ICD-10-CM | POA: Diagnosis not present

## 2023-11-07 DIAGNOSIS — Z170 Estrogen receptor positive status [ER+]: Secondary | ICD-10-CM | POA: Insufficient documentation

## 2023-11-07 DIAGNOSIS — Z5111 Encounter for antineoplastic chemotherapy: Secondary | ICD-10-CM | POA: Insufficient documentation

## 2023-11-07 MED ORDER — GOSERELIN ACETATE 3.6 MG ~~LOC~~ IMPL
3.6000 mg | DRUG_IMPLANT | Freq: Once | SUBCUTANEOUS | Status: AC
  Administered 2023-11-07: 3.6 mg via SUBCUTANEOUS
  Filled 2023-11-07: qty 3.6

## 2023-12-08 ENCOUNTER — Inpatient Hospital Stay: Payer: BC Managed Care – PPO

## 2023-12-08 ENCOUNTER — Inpatient Hospital Stay: Payer: BC Managed Care – PPO | Attending: Hematology and Oncology | Admitting: Hematology and Oncology

## 2023-12-08 VITALS — BP 122/68 | HR 64 | Temp 98.2°F | Resp 20 | Wt 162.6 lb

## 2023-12-08 DIAGNOSIS — Z923 Personal history of irradiation: Secondary | ICD-10-CM | POA: Insufficient documentation

## 2023-12-08 DIAGNOSIS — Z1732 Human epidermal growth factor receptor 2 negative status: Secondary | ICD-10-CM | POA: Diagnosis not present

## 2023-12-08 DIAGNOSIS — Z1721 Progesterone receptor positive status: Secondary | ICD-10-CM | POA: Diagnosis not present

## 2023-12-08 DIAGNOSIS — B181 Chronic viral hepatitis B without delta-agent: Secondary | ICD-10-CM | POA: Insufficient documentation

## 2023-12-08 DIAGNOSIS — Z79810 Long term (current) use of selective estrogen receptor modulators (SERMs): Secondary | ICD-10-CM | POA: Diagnosis not present

## 2023-12-08 DIAGNOSIS — C50412 Malignant neoplasm of upper-outer quadrant of left female breast: Secondary | ICD-10-CM | POA: Diagnosis not present

## 2023-12-08 DIAGNOSIS — R252 Cramp and spasm: Secondary | ICD-10-CM | POA: Diagnosis not present

## 2023-12-08 DIAGNOSIS — Z170 Estrogen receptor positive status [ER+]: Secondary | ICD-10-CM | POA: Insufficient documentation

## 2023-12-08 DIAGNOSIS — R232 Flushing: Secondary | ICD-10-CM | POA: Insufficient documentation

## 2023-12-08 DIAGNOSIS — Z9221 Personal history of antineoplastic chemotherapy: Secondary | ICD-10-CM | POA: Diagnosis not present

## 2023-12-08 DIAGNOSIS — Z5111 Encounter for antineoplastic chemotherapy: Secondary | ICD-10-CM | POA: Insufficient documentation

## 2023-12-08 DIAGNOSIS — M7989 Other specified soft tissue disorders: Secondary | ICD-10-CM | POA: Diagnosis not present

## 2023-12-08 MED ORDER — GOSERELIN ACETATE 3.6 MG ~~LOC~~ IMPL
3.6000 mg | DRUG_IMPLANT | Freq: Once | SUBCUTANEOUS | Status: AC
Start: 2023-12-08 — End: 2023-12-08
  Administered 2023-12-08: 3.6 mg via SUBCUTANEOUS
  Filled 2023-12-08: qty 3.6

## 2023-12-08 NOTE — Progress Notes (Signed)
 [facility] Cancer Follow up:    Sara Rexene MATSU, NP [address] [place] [address]   DIAGNOSIS:  Cancer Staging  Malignant neoplasm of upper-outer quadrant of left breast in female, estrogen receptor positive (HCC) Staging form: Breast, AJCC 8th Edition - Clinical: Stage IIA (cT2, cN0, cM0, G3, ER+, PR+, HER2-) - Signed by Sara Ash, MD on 07/09/2022 Histologic grading system: 3 grade system - Pathologic: Stage IB (pT2, pN0, cM0, G3, ER+, PR+, HER2-) - Signed by Sara Morna Pickle, NP on 10/15/2022 Histologic grading system: 3 grade system   SUMMARY OF ONCOLOGIC HISTORY: Oncology History  Malignant neoplasm of upper-outer quadrant of left breast in female, estrogen receptor positive (HCC)  07/09/2022 Initial Diagnosis   Malignant neoplasm of upper-outer quadrant of left breast in female, estrogen receptor positive (HCC)   07/09/2022 Cancer Staging   Staging form: Breast, AJCC 8th Edition - Clinical: Stage IIA (cT2, cN0, cM0, G3, ER+, PR+, HER2-) - Signed by Sara Ash, MD on 07/09/2022 Histologic grading system: 3 grade system    Genetic Testing   [facility] Multi-Cancer Panel+RNA was Negative. Of note, a variant of uncertain significance was detected in the HOXB13 gene (c.208C>T) and MUTYH gene (c.1547C>T). Report date is 07/24/2022.  The Multi-Cancer + RNA Panel offered by [facility] includes sequencing and/or deletion/duplication analysis of the following 70 genes:  AIP*, ALK, APC*, ATM*, AXIN2*, BAP1*, BARD1*, BLM*, BMPR1A*, BRCA1*, BRCA2*, BRIP1*, CDC73*, CDH1*, CDK4, CDKN1B*, CDKN2A, CHEK2*, CTNNA1*, DICER1*, EPCAM (del/dup only), EGFR, FH*, FLCN*, GREM1 (promoter dup only), HOXB13, KIT, LZTR1, MAX*, MBD4, MEN1*, MET, MITF, MLH1*, MSH2*, MSH3*, MSH6*, MUTYH*, NF1*, NF2*, NTHL1*, PALB2*, PDGFRA, PMS2*, POLD1*, POLE*, POT1*, PRKAR1A*, PTCH1*, PTEN*, RAD51C*, RAD51D*, RB1*, RET, SDHA* (sequencing only), SDHAF2*, SDHB*, SDHC*, SDHD*, SMAD4*, SMARCA4*, SMARCB1*,  SMARCE1*, STK11*, SUFU*, TMEM127*, TP53*, TSC1*, TSC2*, VHL*. RNA analysis is performed for * genes.   08/01/2022 Surgery   Left breast Lumpectomy: IDC, 3.1cm, grade 3, margins negative, no LVI, ER/PR+, Ki-67 70%, HER2-.  9 SLN negative.   08/01/2022 Oncotype testing   24/10%, indicating approximate 6.5% CT benefit. She was not a candidate for OFSET trial because of Hep C status   10/15/2022 - 12/20/2022 Chemotherapy   Patient is on Treatment Plan : BREAST TC q21d     10/15/2022 Cancer Staging   Staging form: Breast, AJCC 8th Edition - Pathologic: Stage IB (pT2, pN0, cM0, G3, ER+, PR+, HER2-) - Signed by Sara Morna Pickle, NP on 10/15/2022 Histologic grading system: 3 grade system   01/22/2023 - 02/18/2023 Radiation Therapy   Plan Name: Breast_L_BH Site: Breast, Left Technique: 3D Mode: Photon Dose Per Fraction: 2.67 Gy Prescribed Dose (Delivered / Prescribed): 40.05 Gy / 40.05 Gy Prescribed Fxs (Delivered / Prescribed): 15 / 15   Plan Name: Brst_L_BH_Bst Site: Breast, Left Technique: 3D Mode: Photon Dose Per Fraction: 2 Gy Prescribed Dose (Delivered / Prescribed): 10 Gy / 10 Gy Prescribed Fxs (Delivered / Prescribed): 5 / 5   02/2023 -  Anti-estrogen oral therapy   Tamoxifen       CURRENT THERAPY: zoladex  with tamoxifen .  INTERVAL HISTORY:  Sara Browning 48 y.o. female returns for f/u.     History of Present Illness     Discussed the use of AI scribe software for clinical note transcription with the patient, who gave verbal consent to proceed.  History of Present Illness Sara Browning is a 48 year old female who presents for follow-up on her current treatment regimen.  She has a history of neuropathy,  which has improved significantly, and she has not experienced any recent trouble. She feels she has recovered from it.  She experiences frequent muscle cramps, described as 'Charlie horse', which she associates with her current medication regimen, including  tamoxifen  and a monthly injection. These cramps occur often at night or during physical activity, such as swimming. She manages them by stretching and walking when they occur.  She experiences hot flashes and manages them by carrying a portable fan, which she finds helpful, especially in hot environments like outdoor events.  A recent hepatitis B lab showed that the virus was detected. She was previously on entecavir but has since stopped, and the virus has returned to its usual state.  She mentions that her mammogram results were normal, and she is scheduled to return for another in one year.  She notes that she is starting to wear her normal size shoes again after experiencing swelling, which has now improved.  Rest of the pertinent 10 point ROS reviewed and neg  Patient Active Problem List   Diagnosis Date Noted   Chronic viral hepatitis B without delta agent and without coma (HCC) 09/25/2022   Genetic testing 07/25/2022   Malignant neoplasm of upper-outer quadrant of left breast in female, estrogen receptor positive (HCC) 07/09/2022    has no known allergies.  MEDICAL HISTORY: Past Medical History:  Diagnosis Date   Cancer (HCC) 2024   Left Breat Cancer   Hepatitis    Hep B Carrier   Hepatitis 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)    Personal history of chemotherapy    Personal history of radiation therapy    Pneumonia    Hx   Urticaria     SURGICAL HISTORY: Past Surgical History:  Procedure Laterality Date   BREAST BIOPSY Left 07/30/2022   US  LT RADIOACTIVE SEED LOC 07/30/2022 [facility] MAMMOGRAPHY   BREAST LUMPECTOMY WITH RADIOACTIVE SEED AND SENTINEL LYMPH NODE BIOPSY Left 08/01/2022   Procedure: LEFT BREAST LUMPECTOMY WITH RADIOACTIVE SEED AND SENTINEL LYMPH NODE BIOPSY;  Surgeon: Vanderbilt Ned, MD;  Location: [facility] OR;  Service: General;  Laterality: Left;   CESAREAN SECTION     twice    SOCIAL HISTORY: Social History   Socioeconomic History   Marital status: Married    Spouse name: Not  on file   Number of children: Not on file   Years of education: Not on file   Highest education level: Not on file  Occupational History   Not on file  Tobacco Use   Smoking status: Never   Smokeless tobacco: Never   Tobacco comments:    Drinks seldom  Vaping Use   Vaping status: Never Used  Substance and Sexual Activity   Alcohol use: Not Currently    Comment: occasionally   Drug use: Never   Sexual activity: Yes  Other Topics Concern   Not on file  Social History Narrative   Not on file   Social Drivers of Health   Financial Resource Strain: Not on file  Food Insecurity: No Food Insecurity (03/13/2023)   Hunger Vital Sign    Worried About Running Out of Food in the Last Year: Never true    Ran Out of Food in the Last Year: Never true  Transportation Needs: No Transportation Needs (03/13/2023)   PRAPARE - Administrator, Civil Service (Medical): No    Lack of Transportation (Non-Medical): No  Physical Activity: Not on file  Stress: Not on file  Social Connections: Not on file  Intimate  Partner Violence: Not At Risk (03/13/2023)   Humiliation, Afraid, Rape, and Kick questionnaire    Fear of Current or Ex-Partner: No    Emotionally Abused: No    Physically Abused: No    Sexually Abused: No    FAMILY HISTORY: Family History  Problem Relation Age of Onset   Liver cancer Maternal Aunt    Breast cancer Maternal Grandmother        Pt states gma had liver cancer which then spread and developed into br cancer.   Hypertension Paternal Grandmother    Diabetes Paternal Grandfather     PHYSICAL EXAMINATION   Onc Performance Status - 12/08/23 1300       KPS SCALE   KPS % SCORE Able to carry on normal activity, minor s/s of disease          Vitals:   12/08/23 1314 12/08/23 1344  BP: 111/62 122/68  Pulse: 64   Resp: 20   Temp: 98.2 F (36.8 C)   SpO2: 100%      Physical Exam Constitutional:      General: She is not in acute distress.     Appearance: Normal appearance. She is not ill-appearing or toxic-appearing.  HENT:     Head: Normocephalic and atraumatic.     Mouth/Throat:     Mouth: Mucous membranes are moist.     Pharynx: Oropharynx is clear. No oropharyngeal exudate or posterior oropharyngeal erythema.  Eyes:     General: No scleral icterus. Cardiovascular:     Rate and Rhythm: Regular rhythm.     Pulses: Normal pulses.     Heart sounds: Normal heart sounds.  Pulmonary:     Effort: Pulmonary effort is normal.     Breath sounds: Normal breath sounds.  Chest:     Comments: Bilateral breasts examined. No palpable masses No regional adenopathy Abdominal:     General: Abdomen is flat. Bowel sounds are normal. There is no distension.     Palpations: Abdomen is soft.     Tenderness: There is no abdominal tenderness.  Musculoskeletal:        General: No swelling.     Cervical back: Neck supple.  Lymphadenopathy:     Cervical: No cervical adenopathy.  Skin:    General: Skin is warm and dry.     Findings: No rash.  Neurological:     General: No focal deficit present.     Mental Status: She is alert.  Psychiatric:        Mood and Affect: Mood normal.        Behavior: Behavior normal.     LABORATORY DATA:  CBC    Component Value Date/Time   WBC 5.8 06/09/2023 1207   RBC 4.57 06/09/2023 1207   HGB 12.6 06/09/2023 1207   HGB 10.9 (L) 12/18/2022 1119   HGB 14.9 03/28/2020 1118   HCT 37.4 06/09/2023 1207   HCT 43.6 03/28/2020 1118   PLT 242 06/09/2023 1207   PLT 331 12/18/2022 1119   PLT 257 03/28/2020 1118   MCV 81.8 06/09/2023 1207   MCV 84 03/28/2020 1118   MCH 27.6 06/09/2023 1207   MCHC 33.7 06/09/2023 1207   RDW 13.8 06/09/2023 1207   RDW 11.9 03/28/2020 1118   LYMPHSABS 2.4 06/09/2023 1207   LYMPHSABS 2.5 03/28/2020 1118   MONOABS 0.4 06/09/2023 1207   EOSABS 0.1 06/09/2023 1207   EOSABS 0.1 03/28/2020 1118   BASOSABS 0.0 06/09/2023 1207   BASOSABS 0.0 03/28/2020 1118  CMP      Component Value Date/Time   NA 137 06/17/2023 1145   NA 136 03/28/2020 1118   K 4.6 06/17/2023 1145   CL 103 06/17/2023 1145   CO2 28 06/17/2023 1145   GLUCOSE 99 06/17/2023 1145   BUN 17 06/17/2023 1145   BUN 11 03/28/2020 1118   CREATININE 0.80 06/17/2023 1145   CALCIUM 9.5 06/17/2023 1145   PROT 7.3 06/17/2023 1145   PROT 7.5 03/28/2020 1118   ALBUMIN 4.3 03/06/2023 1029   ALBUMIN 4.3 03/28/2020 1118   AST 18 06/17/2023 1145   AST 22 03/06/2023 1029   ALT 7 06/17/2023 1145   ALT 13 03/06/2023 1029   ALT 12 09/24/2022 1523   ALKPHOS 81 03/06/2023 1029   BILITOT 0.4 06/17/2023 1145   BILITOT 0.5 03/06/2023 1029   GFRNONAA >60 03/06/2023 1029   GFRAA 102 03/28/2020 1118       ASSESSMENT and THERAPY PLAN:   Malignant neoplasm of upper-outer quadrant of left breast in female, estrogen receptor positive (HCC) Assessment and Plan Assessment & Plan Breast cancer on adjuvant endocrine therapy (tamoxifen  and Zoladex ) with associated muscle cramps and hot flashes Muscle cramps likely due to tamoxifen  and Zoladex , worsened by sun exposure and dehydration. Hot flashes present, managed with a portable fan. - Advise over-the-counter magnesium glycinate before sleep for cramps. - Ensure adequate hydration, especially during sun exposure. - Continue tamoxifen  and Zoladex  regimen. - Schedule monthly Zoladex  injections for at least six months. - Provide information on Guardant Reveal or Signatera blood test for MRD detection.  Chronic hepatitis B infection, post-entecavir, under surveillance Hepatitis B virus detected, not immunocompromised by current treatments. Virus re-emerged post-entecavir cessation, expected as immunity reconstitutes. - Continue surveillance of hepatitis B infection.  Lower extremity swelling, improved Lower extremity swelling has improved, allowing her to wear normal size shoes.  FU in 6 months Mammogram up to date.     All questions were answered.  The patient knows to call the clinic with any problems, questions or concerns. We can certainly see the patient much sooner if necessary.  Total encounter time:30 minutes*in face-to-face visit time, chart review, lab review, care coordination, order entry, and documentation of the encounter time.   *Total Encounter Time as defined by the Centers for Medicare and Medicaid Services includes, in addition to the face-to-face time of a patient visit (documented in the note above) non-face-to-face time: obtaining and reviewing outside history, ordering and reviewing medications, tests or procedures, care coordination (communications with other health care professionals or caregivers) and documentation in the medical record.

## 2023-12-08 NOTE — Assessment & Plan Note (Signed)
 Assessment and Plan Assessment & Plan Breast cancer on adjuvant endocrine therapy (tamoxifen  and Zoladex ) with associated muscle cramps and hot flashes Muscle cramps likely due to tamoxifen  and Zoladex , worsened by sun exposure and dehydration. Hot flashes present, managed with a portable fan. - Advise over-the-counter magnesium glycinate before sleep for cramps. - Ensure adequate hydration, especially during sun exposure. - Continue tamoxifen  and Zoladex  regimen. - Schedule monthly Zoladex  injections for at least six months. - Provide information on Guardant Reveal or Signatera blood test for MRD detection.  Chronic hepatitis B infection, post-entecavir, under surveillance Hepatitis B virus detected, not immunocompromised by current treatments. Virus re-emerged post-entecavir cessation, expected as immunity reconstitutes. - Continue surveillance of hepatitis B infection.  Lower extremity swelling, improved Lower extremity swelling has improved, allowing her to wear normal size shoes.  FU in 6 months Mammogram up to date.

## 2023-12-09 ENCOUNTER — Telehealth: Payer: Self-pay | Admitting: Hematology and Oncology

## 2023-12-09 NOTE — Telephone Encounter (Signed)
 LVM regarding patient upcoming appt.

## 2023-12-23 ENCOUNTER — Other Ambulatory Visit: Payer: Self-pay

## 2023-12-23 ENCOUNTER — Ambulatory Visit: Payer: BC Managed Care – PPO | Admitting: Family

## 2023-12-23 ENCOUNTER — Encounter: Payer: Self-pay | Admitting: Family

## 2023-12-23 VITALS — BP 124/79 | HR 87 | Temp 97.9°F | Wt 166.0 lb

## 2023-12-23 DIAGNOSIS — B181 Chronic viral hepatitis B without delta-agent: Secondary | ICD-10-CM | POA: Diagnosis not present

## 2023-12-23 NOTE — Progress Notes (Signed)
 Subjective:   Patient ID: Sara Browning, female    DOB: 10/19/1975, 48 y.o.   MRN: [id number]  Chief Complaint  Patient presents with   Hepatitis B    HPI:  Sara Browning is a 48 y.o. female with chronic hepatitis B last seen on 06/17/2023 status post chemotherapy for estrogen receptor positive breast cancer and maintained on tamoxifen  and in the surveillance protocol.  Vemlidy  was stopped with no evidence of flare.  Lab work with hepatitis B DNA 115 and ALT 7.  She was continued off medication with 79-month follow-up.  Here today for routine follow-up.  Sara Browning has been doing well since her last office visit with no new concerns/complaints. Continues to take Tamoxifen  and is working through the side effects of medication and trying to lose weight. Denies abdominal pain, nausea, vomiting, fatigue, fever, scleral icterus or jaundice.    No Known Allergies    Outpatient Medications Prior to Visit  Medication Sig Dispense Refill   Melatonin 5 MG CHEW Chew 5 mg by mouth at bedtime as needed (sleep).     Multiple Vitamin (MULTIVITAMIN) tablet Take 1 tablet by mouth daily.     OVER THE COUNTER MEDICATION Take 2 capsules by mouth daily. Graviola supplement     OVER THE COUNTER MEDICATION Take 1 Scoop by mouth daily. prolean greens supplement powder     tamoxifen  (NOLVADEX ) 20 MG tablet Take 1 tablet (20 mg total) by mouth daily. 90 tablet 3   loratadine (CLARITIN) 10 MG tablet Take 10 mg by mouth daily as needed for allergies.     oxyCODONE  (OXY IR/ROXICODONE ) 5 MG immediate release tablet Take 1 tablet (5 mg total) by mouth every 6 (six) hours as needed for severe pain. 15 tablet 0   No facility-administered medications prior to visit.     Past Medical History:  Diagnosis Date   Cancer (HCC) 2024   Left Breat Cancer   Hepatitis    Hep B Carrier   Hepatitis 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)    Personal history of chemotherapy    Personal history of radiation therapy    Pneumonia    Hx    Urticaria      Past Surgical History:  Procedure Laterality Date   BREAST BIOPSY Left 07/30/2022   US  LT RADIOACTIVE SEED LOC 07/30/2022 [facility]   BREAST LUMPECTOMY WITH RADIOACTIVE SEED AND SENTINEL LYMPH NODE BIOPSY Left 08/01/2022   Procedure: LEFT BREAST LUMPECTOMY WITH RADIOACTIVE SEED AND SENTINEL LYMPH NODE BIOPSY;  Surgeon: Vanderbilt Ned, MD;  Location: [facility] OR;  Service: General;  Laterality: Left;   CESAREAN SECTION     twice       Review of Systems  Constitutional:  Negative for chills, fatigue, fever and unexpected weight change.  Respiratory:  Negative for cough, chest tightness, shortness of breath and wheezing.   Cardiovascular:  Negative for chest pain and leg swelling.  Gastrointestinal:  Negative for abdominal distention, constipation, diarrhea, nausea and vomiting.  Neurological:  Negative for dizziness, weakness, light-headedness and headaches.  Hematological:  Does not bruise/bleed easily.    Objective:   BP 124/79   Pulse 87   Temp 97.9 F (36.6 C) (Oral)   Wt 166 lb (75.3 kg)   SpO2 98%   BMI 29.41 kg/m  Nursing note and vital signs reviewed.  Physical Exam Constitutional:      General: She is not in acute distress.    Appearance: She is well-developed.  Cardiovascular:  Rate and Rhythm: Normal rate and regular rhythm.     Heart sounds: Normal heart sounds. No murmur heard.    No friction rub. No gallop.  Pulmonary:     Effort: Pulmonary effort is normal. No respiratory distress.     Breath sounds: Normal breath sounds. No wheezing or rales.  Chest:     Chest wall: No tenderness.  Abdominal:     General: Bowel sounds are normal. There is no distension.     Palpations: Abdomen is soft. There is no mass.     Tenderness: There is no abdominal tenderness. There is no guarding or rebound.  Skin:    General: Skin is warm and dry.  Neurological:     Mental Status: She is alert and oriented to person, place, and time.  Psychiatric:         Behavior: Behavior normal.        Thought Content: Thought content normal.        Judgment: Judgment normal.         11/06/2022    9:30 AM 09/24/2022    2:45 PM  Depression screen PHQ 2/9  Decreased Interest 0 0  Down, Depressed, Hopeless 0 0  PHQ - 2 Score 0 0     Assessment & Plan:    Patient Active Problem List   Diagnosis Date Noted   Chronic viral hepatitis B without delta agent and without coma (HCC) 09/25/2022   Genetic testing 07/25/2022   Malignant neoplasm of upper-outer quadrant of left breast in female, estrogen receptor positive (HCC) 07/09/2022     Problem List Items Addressed This Visit       Digestive   Chronic viral hepatitis B without delta agent and without coma (HCC) - Primary   Sara Browning continues to have lab work that is consistent with chronic inactive Hepatitis B with no symptoms of hepatitis flare or other complications. Discussed plan of care to continue with monitoring q 6 months and will check lab work today. Plan for follow up in 6 months or sooner if needed.       Relevant Orders   Hepatic function panel   Hepatitis B DNA, ultraquantitative, PCR   CBC   Liver Fibrosis, FibroTest-ActiTest     I am having Sara Browning maintain her multivitamin, OVER THE COUNTER MEDICATION, OVER THE COUNTER MEDICATION, Melatonin, loratadine, oxyCODONE , and tamoxifen .   Follow-up: Return in about 6 months (around 06/21/2024). or sooner if needed.   Cathlyn July, MSN, FNP-C Nurse Practitioner [facility] Main number: [phone]

## 2023-12-23 NOTE — Assessment & Plan Note (Signed)
 Sara Browning continues to have lab work that is consistent with chronic inactive Hepatitis B with no symptoms of hepatitis flare or other complications. Discussed plan of care to continue with monitoring q 6 months and will check lab work today. Plan for follow up in 6 months or sooner if needed.

## 2023-12-23 NOTE — Patient Instructions (Signed)
 Nice to see you.  We will check your lab work today.  No medication needed at this time.   Plan for follow up in 6 months or sooner if needed with lab work on the same day.  Have a great day and stay safe!

## 2023-12-26 ENCOUNTER — Ambulatory Visit: Payer: Self-pay | Admitting: Family

## 2023-12-26 LAB — CBC
HCT: 37 % (ref 35.0–45.0)
Hemoglobin: 12.3 g/dL (ref 11.7–15.5)
MCH: 28.8 pg (ref 27.0–33.0)
MCHC: 33.2 g/dL (ref 32.0–36.0)
MCV: 86.7 fL (ref 80.0–100.0)
MPV: 10.3 fL (ref 7.5–12.5)
Platelets: 221 Thousand/uL (ref 140–400)
RBC: 4.27 Million/uL (ref 3.80–5.10)
RDW: 12.9 % (ref 11.0–15.0)
WBC: 6.4 Thousand/uL (ref 3.8–10.8)

## 2023-12-26 LAB — HEPATIC FUNCTION PANEL
AG Ratio: 1.5 (calc) (ref 1.0–2.5)
ALT: 10 U/L (ref 6–29)
AST: 19 U/L (ref 10–35)
Albumin: 3.8 g/dL (ref 3.6–5.1)
Alkaline phosphatase (APISO): 55 U/L (ref 31–125)
Bilirubin, Direct: 0 mg/dL (ref 0.0–0.2)
Globulin: 2.6 g/dL (ref 1.9–3.7)
Indirect Bilirubin: 0.3 mg/dL (ref 0.2–1.2)
Total Bilirubin: 0.3 mg/dL (ref 0.2–1.2)
Total Protein: 6.4 g/dL (ref 6.1–8.1)

## 2023-12-26 LAB — LIVER FIBROSIS, FIBROTEST-ACTITEST
ALT: 9 U/L (ref 6–29)
Alpha-2-Macroglobulin: 130 mg/dL (ref 106–279)
Apolipoprotein A1: 191 mg/dL (ref 101–198)
Bilirubin: 0.3 mg/dL (ref 0.2–1.2)
Fibrosis Score: 0.04
GGT: 16 U/L (ref 3–55)
Haptoglobin: 68 mg/dL (ref 43–212)
Necroinflammat ACT Score: 0.01
Reference ID: 5672059

## 2023-12-26 LAB — HEPATITIS B DNA, ULTRAQUANTITATIVE, PCR
Hepatitis B DNA: 16 [IU]/mL — ABNORMAL HIGH
Hepatitis B virus DNA: 1.2 {Log_IU}/mL — ABNORMAL HIGH

## 2024-01-09 ENCOUNTER — Inpatient Hospital Stay: Attending: Hematology and Oncology

## 2024-01-09 VITALS — BP 128/81 | HR 66 | Temp 98.1°F | Resp 16

## 2024-01-09 DIAGNOSIS — Z170 Estrogen receptor positive status [ER+]: Secondary | ICD-10-CM | POA: Diagnosis not present

## 2024-01-09 DIAGNOSIS — Z5111 Encounter for antineoplastic chemotherapy: Secondary | ICD-10-CM | POA: Diagnosis not present

## 2024-01-09 DIAGNOSIS — Z1721 Progesterone receptor positive status: Secondary | ICD-10-CM | POA: Diagnosis not present

## 2024-01-09 DIAGNOSIS — C50412 Malignant neoplasm of upper-outer quadrant of left female breast: Secondary | ICD-10-CM | POA: Diagnosis not present

## 2024-01-09 DIAGNOSIS — Z1732 Human epidermal growth factor receptor 2 negative status: Secondary | ICD-10-CM | POA: Insufficient documentation

## 2024-01-09 MED ORDER — GOSERELIN ACETATE 3.6 MG ~~LOC~~ IMPL
3.6000 mg | DRUG_IMPLANT | Freq: Once | SUBCUTANEOUS | Status: AC
  Administered 2024-01-09: 3.6 mg via SUBCUTANEOUS
  Filled 2024-01-09: qty 3.6

## 2024-02-04 ENCOUNTER — Telehealth: Payer: Self-pay

## 2024-02-04 NOTE — Telephone Encounter (Signed)
 Called pt to find out If she is still interested in [facility] test. She agreed. Order and requisition placed per MD and faxed. Confirmation received.

## 2024-02-09 ENCOUNTER — Inpatient Hospital Stay: Attending: Hematology and Oncology

## 2024-02-09 VITALS — BP 119/72 | HR 71 | Temp 98.2°F | Resp 16

## 2024-02-09 DIAGNOSIS — Z5111 Encounter for antineoplastic chemotherapy: Secondary | ICD-10-CM | POA: Diagnosis present

## 2024-02-09 DIAGNOSIS — Z170 Estrogen receptor positive status [ER+]: Secondary | ICD-10-CM | POA: Diagnosis not present

## 2024-02-09 DIAGNOSIS — Z1721 Progesterone receptor positive status: Secondary | ICD-10-CM | POA: Diagnosis not present

## 2024-02-09 DIAGNOSIS — C50412 Malignant neoplasm of upper-outer quadrant of left female breast: Secondary | ICD-10-CM | POA: Insufficient documentation

## 2024-02-09 DIAGNOSIS — Z1732 Human epidermal growth factor receptor 2 negative status: Secondary | ICD-10-CM | POA: Insufficient documentation

## 2024-02-09 MED ORDER — GOSERELIN ACETATE 3.6 MG ~~LOC~~ IMPL
3.6000 mg | DRUG_IMPLANT | Freq: Once | SUBCUTANEOUS | Status: AC
  Administered 2024-02-09: 3.6 mg via SUBCUTANEOUS
  Filled 2024-02-09: qty 3.6

## 2024-02-22 DIAGNOSIS — C50412 Malignant neoplasm of upper-outer quadrant of left female breast: Secondary | ICD-10-CM | POA: Diagnosis not present

## 2024-02-27 ENCOUNTER — Telehealth: Payer: Self-pay

## 2024-02-27 NOTE — Telephone Encounter (Signed)
 Called pt per MD to advise [facility] testing was negative/not detected. Pt verbalized understanding of results and knows [facility] will be in touch to schedule 6 mo repeat lab.

## 2024-03-02 ENCOUNTER — Encounter: Payer: Self-pay | Admitting: Hematology and Oncology

## 2024-03-10 ENCOUNTER — Inpatient Hospital Stay

## 2024-03-12 ENCOUNTER — Inpatient Hospital Stay: Attending: Hematology and Oncology

## 2024-03-12 VITALS — BP 129/70 | HR 81 | Resp 17

## 2024-03-12 DIAGNOSIS — C50412 Malignant neoplasm of upper-outer quadrant of left female breast: Secondary | ICD-10-CM | POA: Insufficient documentation

## 2024-03-12 DIAGNOSIS — Z1732 Human epidermal growth factor receptor 2 negative status: Secondary | ICD-10-CM | POA: Diagnosis not present

## 2024-03-12 DIAGNOSIS — Z1721 Progesterone receptor positive status: Secondary | ICD-10-CM | POA: Insufficient documentation

## 2024-03-12 DIAGNOSIS — Z5111 Encounter for antineoplastic chemotherapy: Secondary | ICD-10-CM | POA: Insufficient documentation

## 2024-03-12 DIAGNOSIS — Z79810 Long term (current) use of selective estrogen receptor modulators (SERMs): Secondary | ICD-10-CM | POA: Insufficient documentation

## 2024-03-12 DIAGNOSIS — Z170 Estrogen receptor positive status [ER+]: Secondary | ICD-10-CM | POA: Insufficient documentation

## 2024-03-12 MED ORDER — GOSERELIN ACETATE 3.6 MG ~~LOC~~ IMPL
3.6000 mg | DRUG_IMPLANT | Freq: Once | SUBCUTANEOUS | Status: AC
  Administered 2024-03-12: 3.6 mg via SUBCUTANEOUS
  Filled 2024-03-12: qty 3.6

## 2024-04-09 ENCOUNTER — Inpatient Hospital Stay: Attending: Hematology and Oncology

## 2024-04-09 VITALS — BP 127/70 | HR 76 | Temp 98.0°F | Resp 17

## 2024-04-09 DIAGNOSIS — Z1732 Human epidermal growth factor receptor 2 negative status: Secondary | ICD-10-CM | POA: Insufficient documentation

## 2024-04-09 DIAGNOSIS — Z5111 Encounter for antineoplastic chemotherapy: Secondary | ICD-10-CM | POA: Insufficient documentation

## 2024-04-09 DIAGNOSIS — Z170 Estrogen receptor positive status [ER+]: Secondary | ICD-10-CM | POA: Insufficient documentation

## 2024-04-09 DIAGNOSIS — C50412 Malignant neoplasm of upper-outer quadrant of left female breast: Secondary | ICD-10-CM | POA: Insufficient documentation

## 2024-04-09 DIAGNOSIS — Z1721 Progesterone receptor positive status: Secondary | ICD-10-CM | POA: Diagnosis not present

## 2024-04-09 MED ORDER — GOSERELIN ACETATE 3.6 MG ~~LOC~~ IMPL
3.6000 mg | DRUG_IMPLANT | Freq: Once | SUBCUTANEOUS | Status: AC
  Administered 2024-04-09: 3.6 mg via SUBCUTANEOUS
  Filled 2024-04-09: qty 3.6

## 2024-04-14 DIAGNOSIS — J111 Influenza due to unidentified influenza virus with other respiratory manifestations: Secondary | ICD-10-CM | POA: Diagnosis not present

## 2024-04-28 ENCOUNTER — Other Ambulatory Visit: Payer: Self-pay | Admitting: Hematology and Oncology

## 2024-05-10 ENCOUNTER — Inpatient Hospital Stay: Attending: Hematology and Oncology

## 2024-05-10 DIAGNOSIS — Z1721 Progesterone receptor positive status: Secondary | ICD-10-CM | POA: Insufficient documentation

## 2024-05-10 DIAGNOSIS — Z5111 Encounter for antineoplastic chemotherapy: Secondary | ICD-10-CM | POA: Insufficient documentation

## 2024-05-10 DIAGNOSIS — Z170 Estrogen receptor positive status [ER+]: Secondary | ICD-10-CM | POA: Insufficient documentation

## 2024-05-10 DIAGNOSIS — C50412 Malignant neoplasm of upper-outer quadrant of left female breast: Secondary | ICD-10-CM | POA: Insufficient documentation

## 2024-05-10 DIAGNOSIS — Z1732 Human epidermal growth factor receptor 2 negative status: Secondary | ICD-10-CM | POA: Insufficient documentation

## 2024-05-19 ENCOUNTER — Telehealth: Payer: Self-pay | Admitting: Hematology and Oncology

## 2024-05-19 NOTE — Telephone Encounter (Signed)
 I spoke to patient as she called in to schedule injection appointment for 05/20/2024.

## 2024-05-20 ENCOUNTER — Inpatient Hospital Stay

## 2024-05-20 VITALS — BP 113/65 | HR 96 | Temp 98.3°F | Resp 17

## 2024-05-20 DIAGNOSIS — Z1721 Progesterone receptor positive status: Secondary | ICD-10-CM | POA: Diagnosis not present

## 2024-05-20 DIAGNOSIS — C50412 Malignant neoplasm of upper-outer quadrant of left female breast: Secondary | ICD-10-CM | POA: Diagnosis not present

## 2024-05-20 DIAGNOSIS — Z1732 Human epidermal growth factor receptor 2 negative status: Secondary | ICD-10-CM | POA: Diagnosis not present

## 2024-05-20 DIAGNOSIS — Z5111 Encounter for antineoplastic chemotherapy: Secondary | ICD-10-CM | POA: Diagnosis present

## 2024-05-20 DIAGNOSIS — Z170 Estrogen receptor positive status [ER+]: Secondary | ICD-10-CM | POA: Diagnosis not present

## 2024-05-20 MED ORDER — GOSERELIN ACETATE 3.6 MG ~~LOC~~ IMPL
3.6000 mg | DRUG_IMPLANT | Freq: Once | SUBCUTANEOUS | Status: AC
  Administered 2024-05-20: 3.6 mg via SUBCUTANEOUS
  Filled 2024-05-20: qty 3.6

## 2024-06-09 ENCOUNTER — Inpatient Hospital Stay: Admitting: Hematology and Oncology

## 2024-06-09 ENCOUNTER — Inpatient Hospital Stay

## 2024-06-18 ENCOUNTER — Inpatient Hospital Stay

## 2024-06-18 ENCOUNTER — Inpatient Hospital Stay: Attending: Hematology and Oncology | Admitting: Hematology and Oncology
